# Patient Record
Sex: Female | Born: 1940 | Race: White | Hispanic: No | Smoking: Current every day smoker
Health system: Southern US, Community
[De-identification: ages and names within clinical notes are randomized; demographics above are authoritative.]

## PROBLEM LIST (undated history)

## (undated) DIAGNOSIS — K469 Unspecified abdominal hernia without obstruction or gangrene: Secondary | ICD-10-CM

## (undated) DIAGNOSIS — K219 Gastro-esophageal reflux disease without esophagitis: Secondary | ICD-10-CM

## (undated) DIAGNOSIS — C649 Malignant neoplasm of unspecified kidney, except renal pelvis: Secondary | ICD-10-CM

## (undated) DIAGNOSIS — R350 Frequency of micturition: Secondary | ICD-10-CM

## (undated) DIAGNOSIS — E119 Type 2 diabetes mellitus without complications: Secondary | ICD-10-CM

## (undated) DIAGNOSIS — M549 Dorsalgia, unspecified: Secondary | ICD-10-CM

## (undated) DIAGNOSIS — C3490 Malignant neoplasm of unspecified part of unspecified bronchus or lung: Secondary | ICD-10-CM

## (undated) DIAGNOSIS — E2740 Unspecified adrenocortical insufficiency: Secondary | ICD-10-CM

## (undated) DIAGNOSIS — E039 Hypothyroidism, unspecified: Secondary | ICD-10-CM

## (undated) DIAGNOSIS — F32A Depression, unspecified: Secondary | ICD-10-CM

## (undated) DIAGNOSIS — J449 Chronic obstructive pulmonary disease, unspecified: Secondary | ICD-10-CM

## (undated) DIAGNOSIS — C801 Malignant (primary) neoplasm, unspecified: Secondary | ICD-10-CM

## (undated) DIAGNOSIS — R600 Localized edema: Secondary | ICD-10-CM

## (undated) DIAGNOSIS — R51 Headache: Secondary | ICD-10-CM

## (undated) DIAGNOSIS — R3915 Urgency of urination: Secondary | ICD-10-CM

## (undated) DIAGNOSIS — R351 Nocturia: Secondary | ICD-10-CM

## (undated) DIAGNOSIS — F329 Major depressive disorder, single episode, unspecified: Secondary | ICD-10-CM

## (undated) DIAGNOSIS — C719 Malignant neoplasm of brain, unspecified: Secondary | ICD-10-CM

## (undated) DIAGNOSIS — G479 Sleep disorder, unspecified: Secondary | ICD-10-CM

## (undated) DIAGNOSIS — C419 Malignant neoplasm of bone and articular cartilage, unspecified: Secondary | ICD-10-CM

## (undated) HISTORY — DX: Unspecified adrenocortical insufficiency: E27.40

## (undated) HISTORY — DX: Headache: R51

## (undated) HISTORY — PX: HAND SURGERY: SHX662

## (undated) HISTORY — DX: Hypothyroidism, unspecified: E03.9

## (undated) HISTORY — DX: Dorsalgia, unspecified: M54.9

## (undated) HISTORY — DX: Malignant (primary) neoplasm, unspecified: C80.1

## (undated) HISTORY — DX: Depression, unspecified: F32.A

## (undated) HISTORY — DX: Malignant neoplasm of brain, unspecified: C71.9

## (undated) HISTORY — DX: Localized edema: R60.0

## (undated) HISTORY — DX: Major depressive disorder, single episode, unspecified: F32.9

---

## 1968-10-23 HISTORY — PX: ABDOMINAL HYSTERECTOMY: SHX81

## 1983-10-24 HISTORY — PX: LUNG SURGERY: SHX703

## 1983-10-24 HISTORY — PX: BACK SURGERY: SHX140

## 1986-10-23 HISTORY — PX: BREAST SURGERY: SHX581

## 1998-06-01 ENCOUNTER — Emergency Department (HOSPITAL_COMMUNITY): Admission: EM | Admit: 1998-06-01 | Discharge: 1998-06-01 | Payer: Self-pay | Admitting: Emergency Medicine

## 1999-09-12 ENCOUNTER — Encounter (INDEPENDENT_AMBULATORY_CARE_PROVIDER_SITE_OTHER): Payer: Self-pay

## 1999-09-12 ENCOUNTER — Ambulatory Visit (HOSPITAL_COMMUNITY): Admission: RE | Admit: 1999-09-12 | Discharge: 1999-09-12 | Payer: Self-pay | Admitting: Gastroenterology

## 2000-11-05 ENCOUNTER — Other Ambulatory Visit: Admission: RE | Admit: 2000-11-05 | Discharge: 2000-11-05 | Payer: Self-pay | Admitting: Gynecology

## 2000-11-20 ENCOUNTER — Ambulatory Visit (HOSPITAL_COMMUNITY): Admission: RE | Admit: 2000-11-20 | Discharge: 2000-11-20 | Payer: Self-pay | Admitting: Gynecology

## 2000-11-20 ENCOUNTER — Encounter: Payer: Self-pay | Admitting: Gynecology

## 2001-07-24 ENCOUNTER — Encounter: Payer: Self-pay | Admitting: Gastroenterology

## 2001-07-24 ENCOUNTER — Ambulatory Visit (HOSPITAL_COMMUNITY): Admission: RE | Admit: 2001-07-24 | Discharge: 2001-07-24 | Payer: Self-pay | Admitting: Gastroenterology

## 2001-11-28 ENCOUNTER — Encounter: Payer: Self-pay | Admitting: Internal Medicine

## 2001-11-28 ENCOUNTER — Encounter: Admission: RE | Admit: 2001-11-28 | Discharge: 2001-11-28 | Payer: Self-pay | Admitting: Internal Medicine

## 2002-10-13 ENCOUNTER — Other Ambulatory Visit: Admission: RE | Admit: 2002-10-13 | Discharge: 2002-10-13 | Payer: Self-pay | Admitting: Gynecology

## 2002-11-03 ENCOUNTER — Encounter: Payer: Self-pay | Admitting: Gynecology

## 2002-11-03 ENCOUNTER — Encounter: Admission: RE | Admit: 2002-11-03 | Discharge: 2002-11-03 | Payer: Self-pay | Admitting: Gynecology

## 2003-10-29 ENCOUNTER — Other Ambulatory Visit: Admission: RE | Admit: 2003-10-29 | Discharge: 2003-10-29 | Payer: Self-pay | Admitting: Gynecology

## 2003-11-06 ENCOUNTER — Encounter: Admission: RE | Admit: 2003-11-06 | Discharge: 2003-11-06 | Payer: Self-pay | Admitting: Gynecology

## 2004-11-03 ENCOUNTER — Other Ambulatory Visit: Admission: RE | Admit: 2004-11-03 | Discharge: 2004-11-03 | Payer: Self-pay | Admitting: Gynecology

## 2004-11-23 ENCOUNTER — Ambulatory Visit (HOSPITAL_COMMUNITY): Admission: RE | Admit: 2004-11-23 | Discharge: 2004-11-23 | Payer: Self-pay | Admitting: Gastroenterology

## 2005-11-08 ENCOUNTER — Other Ambulatory Visit: Admission: RE | Admit: 2005-11-08 | Discharge: 2005-11-08 | Payer: Self-pay | Admitting: Gynecology

## 2005-11-27 ENCOUNTER — Ambulatory Visit (HOSPITAL_COMMUNITY): Admission: RE | Admit: 2005-11-27 | Discharge: 2005-11-27 | Payer: Self-pay | Admitting: Gynecology

## 2006-07-26 ENCOUNTER — Ambulatory Visit: Payer: Self-pay | Admitting: Internal Medicine

## 2006-08-20 ENCOUNTER — Ambulatory Visit: Payer: Self-pay | Admitting: Internal Medicine

## 2006-08-23 ENCOUNTER — Encounter: Admission: RE | Admit: 2006-08-23 | Discharge: 2006-08-23 | Payer: Self-pay | Admitting: Orthopedic Surgery

## 2006-11-12 ENCOUNTER — Other Ambulatory Visit: Admission: RE | Admit: 2006-11-12 | Discharge: 2006-11-12 | Payer: Self-pay | Admitting: Gynecology

## 2006-11-22 ENCOUNTER — Ambulatory Visit (HOSPITAL_COMMUNITY): Admission: RE | Admit: 2006-11-22 | Discharge: 2006-11-22 | Payer: Self-pay | Admitting: Gynecology

## 2007-05-03 ENCOUNTER — Ambulatory Visit: Payer: Self-pay | Admitting: Internal Medicine

## 2007-05-04 DIAGNOSIS — Z85118 Personal history of other malignant neoplasm of bronchus and lung: Secondary | ICD-10-CM | POA: Insufficient documentation

## 2007-05-04 DIAGNOSIS — M549 Dorsalgia, unspecified: Secondary | ICD-10-CM | POA: Insufficient documentation

## 2007-10-02 ENCOUNTER — Encounter: Admission: RE | Admit: 2007-10-02 | Discharge: 2007-10-02 | Payer: Self-pay | Admitting: *Deleted

## 2007-10-06 ENCOUNTER — Encounter: Admission: RE | Admit: 2007-10-06 | Discharge: 2007-10-06 | Payer: Self-pay | Admitting: *Deleted

## 2007-11-04 ENCOUNTER — Encounter: Payer: Self-pay | Admitting: Internal Medicine

## 2007-12-02 ENCOUNTER — Ambulatory Visit: Payer: Self-pay | Admitting: Internal Medicine

## 2007-12-02 DIAGNOSIS — R079 Chest pain, unspecified: Secondary | ICD-10-CM | POA: Insufficient documentation

## 2007-12-02 DIAGNOSIS — E785 Hyperlipidemia, unspecified: Secondary | ICD-10-CM

## 2007-12-03 LAB — CONVERTED CEMR LAB
ALT: 16 units/L (ref 0–35)
AST: 15 units/L (ref 0–37)
Albumin: 3.8 g/dL (ref 3.5–5.2)
Alkaline Phosphatase: 83 units/L (ref 39–117)
BUN: 14 mg/dL (ref 6–23)
Basophils Absolute: 0.1 10*3/uL (ref 0.0–0.1)
Basophils Relative: 0.8 % (ref 0.0–1.0)
Bilirubin Urine: NEGATIVE
Bilirubin, Direct: 0.1 mg/dL (ref 0.0–0.3)
CO2: 28 meq/L (ref 19–32)
Calcium: 9 mg/dL (ref 8.4–10.5)
Chloride: 104 meq/L (ref 96–112)
Cholesterol: 190 mg/dL (ref 0–200)
Creatinine, Ser: 0.6 mg/dL (ref 0.4–1.2)
Crystals: NEGATIVE
Direct LDL: 77.7 mg/dL
Eosinophils Absolute: 0.2 10*3/uL (ref 0.0–0.6)
Eosinophils Relative: 1.5 % (ref 0.0–5.0)
GFR calc Af Amer: 128 mL/min
GFR calc non Af Amer: 106 mL/min
Glucose, Bld: 136 mg/dL — ABNORMAL HIGH (ref 70–99)
HCT: 40.3 % (ref 36.0–46.0)
HDL: 30 mg/dL — ABNORMAL LOW (ref >39.0)
Hemoglobin, Urine: NEGATIVE
Hemoglobin: 13.7 g/dL (ref 12.0–15.0)
Hgb A1c MFr Bld: 6.6 % — ABNORMAL HIGH (ref 4.6–6.0)
Ketones, ur: NEGATIVE mg/dL
Lymphocytes Relative: 21.8 % (ref 12.0–46.0)
MCHC: 33.9 g/dL (ref 30.0–36.0)
MCV: 86.8 fL (ref 78.0–100.0)
Monocytes Absolute: 0.8 10*3/uL — ABNORMAL HIGH (ref 0.2–0.7)
Monocytes Relative: 6.6 % (ref 3.0–11.0)
Neutro Abs: 8.9 10*3/uL — ABNORMAL HIGH (ref 1.4–7.7)
Neutrophils Relative %: 69.3 % (ref 43.0–77.0)
Nitrite: NEGATIVE
Platelets: 258 10*3/uL (ref 150–400)
Potassium: 4.5 meq/L (ref 3.5–5.1)
RBC / HPF: NONE SEEN
RBC: 4.65 M/uL (ref 3.87–5.11)
RDW: 13.5 % (ref 11.5–14.6)
Sodium: 139 meq/L (ref 135–145)
Specific Gravity, Urine: 1.025 (ref 1.000–1.03)
TSH: 1.49 microintl units/mL (ref 0.35–5.50)
Total Bilirubin: 0.8 mg/dL (ref 0.3–1.2)
Total CHOL/HDL Ratio: 6.3
Total Protein, Urine: NEGATIVE mg/dL
Total Protein: 7 g/dL (ref 6.0–8.3)
Triglycerides: 279 mg/dL (ref 0–149)
Urine Glucose: NEGATIVE mg/dL
Urobilinogen, UA: 0.2 (ref 0.0–1.0)
VLDL: 56 mg/dL — ABNORMAL HIGH (ref 0–40)
WBC: 12.8 10*3/uL — ABNORMAL HIGH (ref 4.5–10.5)
pH: 5.5 (ref 5.0–8.0)

## 2008-02-19 ENCOUNTER — Ambulatory Visit: Payer: Self-pay | Admitting: Internal Medicine

## 2008-02-19 DIAGNOSIS — E119 Type 2 diabetes mellitus without complications: Secondary | ICD-10-CM

## 2008-02-19 DIAGNOSIS — K219 Gastro-esophageal reflux disease without esophagitis: Secondary | ICD-10-CM

## 2008-04-17 ENCOUNTER — Encounter: Payer: Self-pay | Admitting: Internal Medicine

## 2008-05-13 ENCOUNTER — Ambulatory Visit: Payer: Self-pay | Admitting: Internal Medicine

## 2008-05-13 DIAGNOSIS — F4321 Adjustment disorder with depressed mood: Secondary | ICD-10-CM | POA: Insufficient documentation

## 2008-06-17 ENCOUNTER — Encounter: Payer: Self-pay | Admitting: Internal Medicine

## 2008-07-03 ENCOUNTER — Ambulatory Visit (HOSPITAL_COMMUNITY): Admission: RE | Admit: 2008-07-03 | Discharge: 2008-07-03 | Payer: Self-pay | Admitting: Gastroenterology

## 2008-07-13 ENCOUNTER — Ambulatory Visit: Payer: Self-pay | Admitting: Internal Medicine

## 2008-07-13 DIAGNOSIS — J209 Acute bronchitis, unspecified: Secondary | ICD-10-CM | POA: Insufficient documentation

## 2008-08-06 ENCOUNTER — Ambulatory Visit: Payer: Self-pay | Admitting: Internal Medicine

## 2008-08-06 DIAGNOSIS — R252 Cramp and spasm: Secondary | ICD-10-CM | POA: Insufficient documentation

## 2008-08-06 DIAGNOSIS — F411 Generalized anxiety disorder: Secondary | ICD-10-CM | POA: Insufficient documentation

## 2008-08-06 DIAGNOSIS — R634 Abnormal weight loss: Secondary | ICD-10-CM | POA: Insufficient documentation

## 2008-08-06 DIAGNOSIS — F3289 Other specified depressive episodes: Secondary | ICD-10-CM | POA: Insufficient documentation

## 2008-08-06 DIAGNOSIS — F329 Major depressive disorder, single episode, unspecified: Secondary | ICD-10-CM | POA: Insufficient documentation

## 2008-12-22 ENCOUNTER — Ambulatory Visit: Payer: Self-pay | Admitting: Internal Medicine

## 2008-12-22 DIAGNOSIS — J441 Chronic obstructive pulmonary disease with (acute) exacerbation: Secondary | ICD-10-CM

## 2008-12-22 LAB — CONVERTED CEMR LAB
ALT: 13 units/L (ref 0–35)
AST: 15 units/L (ref 0–37)
Albumin: 3.7 g/dL (ref 3.5–5.2)
Alkaline Phosphatase: 73 units/L (ref 39–117)
BUN: 15 mg/dL (ref 6–23)
Basophils Absolute: 0.1 10*3/uL (ref 0.0–0.1)
Basophils Relative: 0.9 % (ref 0.0–3.0)
Bilirubin Urine: NEGATIVE
Bilirubin, Direct: 0.1 mg/dL (ref 0.0–0.3)
CO2: 28 meq/L (ref 19–32)
Calcium: 8.7 mg/dL (ref 8.4–10.5)
Chloride: 104 meq/L (ref 96–112)
Cholesterol: 204 mg/dL (ref 0–200)
Creatinine, Ser: 0.7 mg/dL (ref 0.4–1.2)
Crystals: NEGATIVE
Direct LDL: 98.3 mg/dL
Eosinophils Absolute: 0.2 10*3/uL (ref 0.0–0.7)
Eosinophils Relative: 1.7 % (ref 0.0–5.0)
GFR calc Af Amer: 107 mL/min
GFR calc non Af Amer: 88 mL/min
Glucose, Bld: 98 mg/dL (ref 70–99)
HCT: 40.1 % (ref 36.0–46.0)
HDL: 26 mg/dL — ABNORMAL LOW (ref >39.0)
Hemoglobin, Urine: NEGATIVE
Hemoglobin: 13.8 g/dL (ref 12.0–15.0)
Ketones, ur: NEGATIVE mg/dL
Lymphocytes Relative: 26.2 % (ref 12.0–46.0)
MCHC: 34.4 g/dL (ref 30.0–36.0)
MCV: 85.6 fL (ref 78.0–100.0)
Monocytes Absolute: 0.8 10*3/uL (ref 0.1–1.0)
Monocytes Relative: 5.8 % (ref 3.0–12.0)
Mucus, UA: NEGATIVE
Neutro Abs: 8.7 10*3/uL — ABNORMAL HIGH (ref 1.4–7.7)
Neutrophils Relative %: 65.4 % (ref 43.0–77.0)
Nitrite: NEGATIVE
Platelets: 241 10*3/uL (ref 150–400)
Potassium: 3.9 meq/L (ref 3.5–5.1)
RBC: 4.68 M/uL (ref 3.87–5.11)
RDW: 12.8 % (ref 11.5–14.6)
Sodium: 140 meq/L (ref 135–145)
Specific Gravity, Urine: 1.015 (ref 1.000–1.035)
TSH: 1.49 microintl units/mL (ref 0.35–5.50)
Total Bilirubin: 0.7 mg/dL (ref 0.3–1.2)
Total CHOL/HDL Ratio: 7.8
Total Protein, Urine: NEGATIVE mg/dL
Total Protein: 7 g/dL (ref 6.0–8.3)
Triglycerides: 374 mg/dL (ref 0–149)
Urine Glucose: NEGATIVE mg/dL
Urobilinogen, UA: 0.2 (ref 0.0–1.0)
VLDL: 75 mg/dL — ABNORMAL HIGH (ref 0–40)
WBC: 13.3 10*3/uL — ABNORMAL HIGH (ref 4.5–10.5)
pH: 6.5 (ref 5.0–8.0)

## 2008-12-25 ENCOUNTER — Telehealth (INDEPENDENT_AMBULATORY_CARE_PROVIDER_SITE_OTHER): Payer: Self-pay | Admitting: *Deleted

## 2009-12-09 ENCOUNTER — Encounter: Payer: Self-pay | Admitting: Internal Medicine

## 2009-12-21 ENCOUNTER — Ambulatory Visit: Payer: Self-pay | Admitting: Internal Medicine

## 2010-01-07 ENCOUNTER — Telehealth (INDEPENDENT_AMBULATORY_CARE_PROVIDER_SITE_OTHER): Payer: Self-pay | Admitting: *Deleted

## 2010-03-29 ENCOUNTER — Encounter: Payer: Self-pay | Admitting: Internal Medicine

## 2010-04-20 ENCOUNTER — Ambulatory Visit: Payer: Self-pay | Admitting: Internal Medicine

## 2010-04-20 DIAGNOSIS — Z860100 Personal history of colon polyps, unspecified: Secondary | ICD-10-CM | POA: Insufficient documentation

## 2010-04-20 DIAGNOSIS — K5730 Diverticulosis of large intestine without perforation or abscess without bleeding: Secondary | ICD-10-CM | POA: Insufficient documentation

## 2010-04-20 DIAGNOSIS — K4090 Unilateral inguinal hernia, without obstruction or gangrene, not specified as recurrent: Secondary | ICD-10-CM | POA: Insufficient documentation

## 2010-04-20 LAB — CONVERTED CEMR LAB: TSH: 1.67 microintl units/mL (ref 0.35–5.50)

## 2010-05-03 ENCOUNTER — Encounter: Payer: Self-pay | Admitting: Internal Medicine

## 2010-06-29 ENCOUNTER — Ambulatory Visit: Payer: Self-pay | Admitting: Internal Medicine

## 2010-10-30 ENCOUNTER — Emergency Department (HOSPITAL_COMMUNITY)
Admission: EM | Admit: 2010-10-30 | Discharge: 2010-10-31 | Payer: Self-pay | Source: Home / Self Care | Admitting: Emergency Medicine

## 2010-11-07 LAB — COMPREHENSIVE METABOLIC PANEL
ALT: 10 U/L (ref 0–35)
AST: 14 U/L (ref 0–37)
Albumin: 3.4 g/dL — ABNORMAL LOW (ref 3.5–5.2)
Alkaline Phosphatase: 73 U/L (ref 39–117)
BUN: 13 mg/dL (ref 6–23)
CO2: 25 mEq/L (ref 19–32)
Calcium: 9.1 mg/dL (ref 8.4–10.5)
Chloride: 105 mEq/L (ref 96–112)
Creatinine, Ser: 0.8 mg/dL (ref 0.4–1.2)
GFR calc Af Amer: >60 mL/min (ref >60)
GFR calc non Af Amer: >60 mL/min (ref >60)
Glucose, Bld: 104 mg/dL — ABNORMAL HIGH (ref 70–99)
Potassium: 3.8 mEq/L (ref 3.5–5.1)
Sodium: 138 mEq/L (ref 135–145)
Total Bilirubin: 0.5 mg/dL (ref 0.3–1.2)
Total Protein: 6.5 g/dL (ref 6.0–8.3)

## 2010-11-07 LAB — URINE MICROSCOPIC-ADD ON

## 2010-11-07 LAB — URINALYSIS, ROUTINE W REFLEX MICROSCOPIC
Bilirubin Urine: NEGATIVE
Hgb urine dipstick: NEGATIVE
Ketones, ur: NEGATIVE mg/dL
Nitrite: NEGATIVE
Protein, ur: NEGATIVE mg/dL
Specific Gravity, Urine: 1.011 (ref 1.005–1.030)
Urine Glucose, Fasting: NEGATIVE mg/dL
Urobilinogen, UA: 0.2 mg/dL (ref 0.0–1.0)
pH: 6.5 (ref 5.0–8.0)

## 2010-11-07 LAB — LIPASE, BLOOD: Lipase: 18 U/L (ref 11–59)

## 2010-11-17 ENCOUNTER — Telehealth (INDEPENDENT_AMBULATORY_CARE_PROVIDER_SITE_OTHER): Payer: Self-pay | Admitting: *Deleted

## 2010-11-24 NOTE — Consult Note (Signed)
Summary: [facility]   Imported By: Sherian Rein 05/16/2010 11:36:43  _____________________________________________________________________  External Attachment:    Type:   Image     Comment:   External Document

## 2010-11-24 NOTE — Assessment & Plan Note (Signed)
Summary: fu---stc   Vital Signs:  Patient profile:   70 year old female Height:      67 inches Weight:      136 pounds BMI:     21.38 O2 Sat:      96 % on Room air Temp:     97.5 degrees F oral Pulse rate:   73 / minute BP sitting:   102 / 62  (left arm) Cuff size:   regular  Vitals Entered ByZella Ball Ewing (December 21, 2009 8:07 AM)  O2 Flow:  Room air  Preventive Care Screening     declines flu shot and pneumovax;  sees GYn yearly with pap , delcines mammogram and dxa  CC: followup/RE   CC:  followup/RE.  History of Present Illness: here for wellness, recent labs per GYN reviewed with mild elevtated a1c only;  Pt denies CP, sob, doe, wheezing, orthopnea, pnd, worsening LE edema, palps, dizziness or syncope   Pt denies new neuro symptoms such as headache, facial or extremity weakness   Pt denies polydipsia, polyuria, or low sugar symptoms such as shakiness improved with eating.  Overall good compliance with meds, trying to follow low chol, DM diet, wt stable, little excercise however   Preventive Screening-Counseling & Management      Drug Use:  no.    Problems Prior to Update: 1)  Chronic Obstructive Pulmonary Disease, Acute Exacerbation  (ICD-491.21) 2)  Preventive Health Care  (ICD-V70.0) 3)  Anxiety  (ICD-300.00) 4)  Depression  (ICD-311) 5)  Leg Cramps, Nocturnal  (ICD-729.82) 6)  Depressive Disorder  (ICD-311) 7)  Chest Pain Unspecified  (ICD-786.50) 8)  Weight Loss  (ICD-783.21) 9)  Asthmatic Bronchitis, Acute  (ICD-466.0) 10)  Grief Reaction, Acute  (ICD-309.0) 11)  Gerd  (ICD-530.81) 12)  Diabetes Mellitus, Type II  (ICD-250.00) 13)  Preventive Health Care  (ICD-V70.0) 14)  Chest Pain  (ICD-786.50) 15)  Hyperlipidemia  (ICD-272.4) 16)  Lung Cancer, Hx of  (ICD-V10.11) 17)  Backache Nos  (ICD-724.5) 18)  Elective Surgery, Elective Surgery Nec  (ICD-V50.8)  Medications Prior to Update: 1)  Parafon Forte Dsc 500 Mg  Tabs (Chlorzoxazone) .Marland Kitchen.. 1 By Mouth  Three Times A Day Prn 2)  Sertraline Hcl 50 Mg Tabs (Sertraline Hcl) .Marland Kitchen.. 1 By Mouth Once Daily 3)  Cephalexin 500 Mg Caps (Cephalexin) .Marland Kitchen.. 1 By Mouth Three Times A Day 4)  Prednisone 10 Mg Tabs (Prednisone) .... 4po Qd For 3days, Then 3po Qd For 3days, Then 2po Qd For 3days, Then 1po Qd For 3 Days, Then Stop 5)  Tessalon Perles 100 Mg Caps (Benzonatate) .Marland Kitchen.. 1 - 2 By Mouth Three Times A Day As Needed Cough  Current Medications (verified): 1)  Parafon Forte Dsc 500 Mg  Tabs (Chlorzoxazone) .Marland Kitchen.. 1 By Mouth Three Times A Day Prn 2)  Sertraline Hcl 50 Mg Tabs (Sertraline Hcl) .Marland Kitchen.. 1 By Mouth Once Daily 3)  Cephalexin 500 Mg Caps (Cephalexin) .Marland Kitchen.. 1 By Mouth Three Times A Day 4)  Prednisone 10 Mg Tabs (Prednisone) .... 4po Qd For 3days, Then 3po Qd For 3days, Then 2po Qd For 3days, Then 1po Qd For 3 Days, Then Stop 5)  Tessalon Perles 100 Mg Caps (Benzonatate) .Marland Kitchen.. 1 - 2 By Mouth Three Times A Day As Needed Cough  Allergies (verified): 1)  ! Codeine 2)  ! 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 Hysterectomy-1972 Lung-lobectomy- Right [phone] Breast Augmentation- 1989 Back- Fell & damaged L4/L5 s/p left frozen shoulder swurgury  Social History: Reviewed history from 12/02/2007 and no changes required. 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         all otherwise negative per pt -    Physical Exam  General:  alert and well-developed.   Head:  normocephalic and atraumatic.   Eyes:  vision grossly intact, pupils equal, and pupils round.   Ears:  R ear normal and L ear normal.   Nose:  no external deformity and no nasal discharge.   Mouth:  no gingival abnormalities and pharynx pink and moist.   Neck:  supple and no masses.   Lungs:  normal respiratory effort and normal breath sounds.   Heart:  normal rate and regular rhythm.   Abdomen:  soft, non-tender, and normal bowel sounds.   Msk:  no joint tenderness and no joint swelling.   Extremities:  no edema, no erythema  Neurologic:  cranial nerves II-XII intact and strength normal in all extremities.   Psych:  not anxious appearing and not depressed appearing.     Impression & Recommendations:  Problem # 1:  Preventive Health Care (ICD-V70.0) Overall doing well, age appropriate education and counseling updated and referral for appropriate preventive services done unless declined, immunizations up to date or declined, diet counseling done if overweight, urged to quit smoking if smokes , most recent labs reviewed and current ordered if appropriate, ecg reviewed or declined (interpretation per ECG scanned in the EMR if done); information regarding Medicare Prevention requirements given if appropriate   Problem # 2:  DIABETES MELLITUS, TYPE II (ICD-250.00) recent blood sugar elevated  Complete Medication  List: 1)  Parafon Forte Dsc 500 Mg Tabs (Chlorzoxazone) .Marland Kitchen.. 1 by mouth three times a day prn 2)  Sertraline Hcl 50 Mg Tabs (Sertraline hcl) .Marland Kitchen.. 1 by mouth once daily 3)  Cephalexin 500 Mg Caps (Cephalexin) .Marland Kitchen.. 1 by mouth three times a day 4)  Prednisone 10 Mg Tabs (Prednisone) .... 4po qd for 3days, then 3po qd for 3days, then 2po qd for 3days, then 1po qd for 3 days, then stop 5)  Tessalon Perles 100 Mg Caps (Benzonatate) .Marland Kitchen.. 1 - 2 by mouth three times a day as needed cough  Patient Instructions: 1)  Continue all previous medications as before this visit  2)  Please schedule a follow-up appointment in 6 months wth : 3)  BMP prior to visit, ICD-9: 250.02 4)  Lipid Panel prior to visit, ICD-9: 5)  HbgA1C prior to visit, ICD-9:

## 2010-11-24 NOTE — Progress Notes (Signed)
Summary: back specialist  Phone Note Call from Patient Call back at [phone]   Summary of Call: Patient called requesting the name of a back specialist. Please advise. Initial call taken by: Lucious Groves,  January 07, 2010 10:53 AM  Follow-up for Phone Call        Dr Dutch Quint is very good neurosurgeon Follow-up by: Corwin Levins MD,  January 07, 2010 11:01 AM  Additional Follow-up for Phone Call Additional follow up Details #1::        Informed pt Additional Follow-up by: Josph Macho RMA,  January 07, 2010 1:41 PM

## 2010-11-24 NOTE — Assessment & Plan Note (Signed)
Summary: depression/offered sda pt req this wk/#/cd   Vital Signs:  Patient profile:   70 year old female Height:      67 inches Weight:      130 pounds BMI:     20.43 O2 Sat:      97 % on Room air Temp:     98.3 degrees F oral Pulse rate:   70 / minute BP sitting:   100 / 60  (left arm) Cuff size:   regular  Vitals Entered By: Zella Ball Ewing CMA Duncan Dull) (April 20, 2010 8:10 AM)  O2 Flow:  Room air  Preventive Care Screening  Colonoscopy:    Date:  03/29/2010    Next Due:  03/2015    Results:  abnormal      declines mammogram and pneumovax  CC: Depression and weight loss/RE   CC:  Depression and weight loss/RE.  History of Present Illness: here with increased depressive symptoms with fatigue, low mood, loss of appetite tearfulness that is out of proportion to what she has been able to get past herself in the past;  late may 2011 was in bed for about 2 wks;  she made it to a local urgent care and tx for bronchitis and copd exacerbation (antibx, prednisone, cough med);  but depression and fatigue persists;  no suicidal ideation or significant increesed anxiety or panic.  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  Still smoking and not thinkging of trying to quit at this time.  Has had low times in the past but no med trials.. Goes to husbnad's grave once montnly.  Has people at church for support but felt too bad to do that.  peak average wt semed to be about 155 a few yrs ago. Since husbnad died now down to 130, 6 lbs of that in the past month.  Has a knot to the left inguinal area that seems to come and go without pain, n/v.  Just had colonoscopy.    chronic back pain stable but persits and her insurance is not accepted by her previous orthopedic.  Has chronic persistent tailbone pain with the wt loss. , sits with  a donut mostly.    Problems Prior to Update: 1)  Chronic Obstructive Pulmonary Disease, Acute Exacerbation  (ICD-491.21) 2)   Preventive Health Care  (ICD-V70.0) 3)  Anxiety  (ICD-300.00) 4)  Depression  (ICD-311) 5)  Leg Cramps, Nocturnal  (ICD-729.82) 6)  Depressive Disorder  (ICD-311) 7)  Chest Pain Unspecified  (ICD-786.50) 8)  Weight Loss  (ICD-783.21) 9)  Asthmatic Bronchitis, Acute  (ICD-466.0) 10)  Grief Reaction, Acute  (ICD-309.0) 11)  Gerd  (ICD-530.81) 12)  Diabetes Mellitus, Type II  (ICD-250.00) 13)  Preventive Health Care  (ICD-V70.0) 14)  Chest Pain  (ICD-786.50) 15)  Hyperlipidemia  (ICD-272.4) 16)  Lung Cancer, Hx of  (ICD-V10.11) 17)  Backache Nos  (ICD-724.5) 18)  Elective Surgery, Elective Surgery Nec  (ICD-V50.8)  Medications Prior to Update: 1)  Parafon Forte Dsc 500 Mg  Tabs (Chlorzoxazone) .Marland Kitchen.. 1 By Mouth Three Times A Day Prn 2)  Sertraline Hcl 50 Mg Tabs (Sertraline Hcl) .Marland Kitchen.. 1 By Mouth Once Daily 3)  Cephalexin 500 Mg Caps (Cephalexin) .Marland Kitchen.. 1 By Mouth Three Times A Day 4)  Prednisone 10 Mg Tabs (Prednisone) .... 4po Qd For 3days, Then 3po Qd For 3days, Then 2po Qd For 3days, Then 1po Qd For 3 Days, Then Stop 5)  Tessalon Perles  100 Mg Caps (Benzonatate) .Marland Kitchen.. 1 - 2 By Mouth Three Times A Day As Needed Cough  Current Medications (verified): 1)  Parafon Forte Dsc 500 Mg  Tabs (Chlorzoxazone) .Marland Kitchen.. 1 By Mouth Three Times A Day Prn 2)  Sertraline Hcl 50 Mg Tabs (Sertraline Hcl) .Marland Kitchen.. 1 By Mouth Once Daily 3)  Cephalexin 500 Mg Caps (Cephalexin) .Marland Kitchen.. 1 By Mouth Three Times A Day 4)  Prednisone 10 Mg Tabs (Prednisone) .... 4po Qd For 3days, Then 3po Qd For 3days, Then 2po Qd For 3days, Then 1po Qd For 3 Days, Then Stop 5)  Tessalon Perles 100 Mg Caps (Benzonatate) .Marland Kitchen.. 1 - 2 By Mouth Three Times A Day As Needed Cough  Allergies (verified): 1)  ! Codeine 2)  ! 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 Hysterectomy-1972 Lung-lobectomy- Right [phone] Breast Augmentation- 1989 Back- Fell & damaged L4/L5 s/p left frozen shoulder swurgury  Review of Systems  The patient denies fever, weight gain, vision loss, decreased hearing, hoarseness, chest pain, syncope, dyspnea on exertion, peripheral edema, prolonged cough, headaches, hemoptysis, abdominal pain, melena, hematochezia, severe indigestion/heartburn, hematuria, muscle weakness, suspicious skin lesions, transient blindness, difficulty walking, unusual weight change, abnormal bleeding, enlarged lymph nodes, and angioedema.         all otherwise negative per pt -    Physical Exam  General:  alert and underweight appearing.   Head:  normocephalic and atraumatic.   Eyes:  vision grossly intact, pupils equal, and pupils round.   Ears:  R ear normal and L ear normal.   Nose:  no external deformity and no nasal discharge.   Mouth:  no gingival abnormalities and pharynx pink and moist.   Neck:  supple and no masses.   Lungs:  normal respiratory effort, R decreased breath sounds, and L decreased breath sounds.   Heart:  normal rate and regular rhythm.   Abdomen:  soft, non-tender, and normal bowel sounds.  , nontender reducible left ing hernia noted Msk:  no joint tenderness and no joint swelling.   Extremities:  no edema, no erythema  Neurologic:  cranial nerves II-XII intact and strength normal in all extremities.     Impression & Recommendations:  Problem # 1:  Preventive Health  Care (ICD-V70.0)  Overall doing well, age appropriate education and counseling updated and referral for appropriate preventive services done unless declined, immunizations up to date or declined, diet counseling done if overweight, urged to quit smoking if smokes , most recent labs reviewed and current ordered if appropriate, ecg reviewed or declined (interpretation per ECG scanned in the EMR if done); information regarding Medicare Prevention requirements given if appropriate; speciality referrals updated as appropriate ; for TSH, other labs from GYN feb 2011 reviewed  Orders: TLB-TSH (Thyroid Stimulating Hormone) (84443-TSH)  Problem # 2:  DEPRESSION (ICD-311)  Her updated medication list for this problem includes:    Paroxetine Hcl 20 Mg Tabs (Paroxetine hcl) .Marland Kitchen... 1 by mouth once daily treat  as above, f/u any worsening signs or symptoms   Problem # 3:  INGUINAL HERNIA, LEFT (ICD-550.90)  for gen surgury opinion  Orders: Surgical Referral (Surgery)  Complete Medication List: 1)  Parafon Forte Dsc 500 Mg Tabs (Chlorzoxazone) .Marland Kitchen.. 1 by mouth three times a day prn 2)  Paroxetine Hcl 20 Mg Tabs (Paroxetine hcl) .Marland Kitchen.. 1 by mouth once daily  Patient Instructions: 1)  Please take all new medications as prescribed  - the paxil is taken at HALF pill for 1 wk, then whole pill after that 2)  Continue all previous medications as before this visit 3)  Please go to the Lab in the basement for your blood and/or urine tests today  4)  You will be contacted about the referral(s) to: General surgury 5)  Please schedule a follow-up appointment in 2 months or sooner if needed Prescriptions: PAROXETINE HCL 20 MG TABS (PAROXETINE HCL) 1 by mouth once daily  #90 x 3   Entered and Authorized by:   Corwin Levins MD   Signed by:   Corwin Levins MD on 04/20/2010   Method used:   Print then Give to Patient   RxID:   [id number]

## 2010-11-24 NOTE — Progress Notes (Signed)
  Phone Note Other Incoming   Request: Send information Summary of Call: Request for records received from [facility]. Request forwarded to [employer].  Last 5 yrs med hx.

## 2010-11-24 NOTE — Procedures (Signed)
Summary: Colonoscopy / [facility]  Colonoscopy / [facility]   Imported By: Lennie Odor 04/08/2010 16:44:33  _____________________________________________________________________  External Attachment:    Type:   Image     Comment:   External Document

## 2010-11-25 NOTE — Letter (Signed)
Summary: Beather Arbour MD  Beather Arbour MD   Imported By: Sherian Rein 12/16/2009 11:03:09  _____________________________________________________________________  External Attachment:    Type:   Image     Comment:   External Document

## 2011-03-07 NOTE — Op Note (Signed)
[name], Kaylee [name]         ACCOUNT NO.:  [id number]   MEDICAL RECORD NO.:  [id number]          PATIENT TYPE:  AMB   LOCATION:  ENDO                         FACILITY:  [facility]   PHYSICIAN:  Bernette Redbird, M.D.   DATE OF BIRTH:  [birth year]   DATE OF PROCEDURE:  07/03/2008  DATE OF DISCHARGE:                               OPERATIVE REPORT   PROCEDURE:  Upper endoscopy Savary dilatation of the esophagus under  fluoroscopy.   INDICATIONS:  A 70 year old female with dysphagia symptoms in the  setting of longstanding GERD.   FINDINGS:  Essentially normal exam.   PROCEDURE:  The nature, purpose, and risks of the procedure had been  discussed with the patient who provided written consent.  Sedation was  fentanyl 75 mcg and Versed 10 mg IV prior to and during the course the  procedure without clinical instability.  The Pentax video endoscope was  passed under direct vision, entering the esophagus without significant  difficulty.   The esophagus was endoscopically normal despite a history of GERD.  I  did not see any Barrett esophagus, reflux esophagitis, free reflux nor  any other problems such as varices, infection, or neoplasia.  A very  small hiatal hernia was present, but I could not visualize any  esophageal ring, stricture, or other source of dysphagia and there was  no evidence of any obvious esophageal spasm or corkscrewing.   The stomach contained a small to moderate bilious residual that was  suctioned up.  There was a little bit of patchy antral erythema  consistent with the patient's use of Excedrin.  There was even a small  erosion in the antrum, but no frank ulcers and no polyps or masses.  The  retroflexed view the cardia was unremarkable.   The pylorus, duodenal bulb, and second duodenum looked normal.   Savary dilatation was then performed in the standard fashion.  The  spring tip guidewire was passed through the scope into the antrum of the  stomach, and the  scope was removed in an exchange fashion, leaving the  guidewire in place.  Its position was confirmed fluoroscopically and  passage was then made using a 16-mm Savary dilator with fluoroscopic  guidance, confirming passage of the widest portion of the dilator below  the level of the diaphragm.  I re-endoscoped the patient to make sure  there was no significant mucosal disruption, although there has just  been smooth resistance with passage of the dilator.  Indeed, repeat  endoscopy showed no evidence of any mucosal disruption, so I went ahead  and reinserted the guidewire and we performed Savary dilatation by the  same technique as described above, using an 18-mm dilator.  I then re-  endoscoped the patient again.  This, like before, demonstrated no  evidence of any mucosal disruption nor of any significant trauma to the  stomach, esophagus, or hypopharynx.   The scope was removed from the patient who tolerated the procedure well  and without any apparent complication.   IMPRESSION:  1. Dysphagia symptoms, without endoscopically-evident cause.  2. Empiric esophageal dilatation to 18 mm performed as described  above.   PLAN:  Clinical followup of dysphagia symptoms.           ______________________________  Bernette Redbird, M.D.     RB/MEDQ  D:  07/03/2008  T:  07/04/2008  Job:  [id number]   cc:   Corwin Levins, MD

## 2011-03-07 NOTE — Assessment & Plan Note (Signed)
[facility]                                 ON-CALL NOTE   Kaylee, [name]                  MRN:          [id number]  DATE:09/28/2007                            DOB:          1940/11/05    Date of service:  September 28, 2007.  Time received:  7:52 a.m.  Caller:  [name].   PRIMARY CARE PHYSICIAN:  Corwin Levins, M.D.   Date of birth:  06-May-1941.  Telephone:  [phone].   Yesterday evening while trying to out of her car she tripped and fell,  landing on the pavement.  She said she hit directly on her left breast.  All night long and again this morning, she has severe, sharp pains in  the breast and in the ribs.  It hurts to breathe.  She is worried she  may have cracked a rib.  My advice is to go to [facility]  later this morning to be seen.     Tera Mater. Clent Ridges, MD  Electronically Signed    SAF/MedQ  DD: 09/28/2007  DT: 09/29/2007  Job #: [id number]

## 2011-03-10 NOTE — Op Note (Signed)
Kaylee [name], Kaylee [name]         ACCOUNT NO.:  [id number]   MEDICAL RECORD NO.:  [id number]          PATIENT TYPE:  AMB   LOCATION:  ENDO                         FACILITY:  [facility]   PHYSICIAN:  Bernette Redbird, M.D.   DATE OF BIRTH:  04-Jul-1941   DATE OF PROCEDURE:  11/23/2004  DATE OF DISCHARGE:                                 OPERATIVE REPORT   PROCEDURE:  Upper endoscopy.   INDICATIONS:  Nonspecific chest pain in a 70 year old female, with response  to PPI therapy suggesting probable reflux etiology.   FINDINGS:  Normal exam.   PROCEDURE:  That nature, purpose, and risks of the procedure were reviewed  with the patient prior to the procedure and she provided written consent.   SEDATION:  Fentanyl 75 mcg and Versed 7.5 milligrams IV without arrhythmias  or desaturation.   DESCRIPTION OF PROCEDURE:  The Olympus video endoscope was passed under  direct vision. The vocal cords were not well seen but the esophagus was  readily entered and appeared normal. No reflux esophagitis, Barrett's  esophagus, varices, infection or neoplasia were observed in the esophagus  and no ring, stricture or significant hiatal hernia was appreciated.   The stomach contained no residual and had normal mucosa without evidence of  gastritis, erosions, ulcers, polyps or masses and a retroflexed view of  cardia was unremarkable. Pylorus, duodenal bulb and second duodenum  similarly looked normal.   The scope was removed from the patient. No biopsies were obtained. She  tolerated the procedure well and there were no apparent complications.   IMPRESSION:  Chest pain, probably due to reflux, responsive to PPI therapy,  without adverse sequelae endoscopically evident (530.81).   PLAN:  Continue PPI therapy (Protonix) as needed to control symptoms.      RB/MEDQ  D:  11/23/2004  T:  11/23/2004  Job:  [id number]   cc:   Ike Bene, M.D.  [address]  [place]  [place] [address]  Fax:  [phone]

## 2011-03-10 NOTE — Procedures (Signed)
[place]  Patient:    Kaylee [name], Kaylee [name] Visit Number: [id number] MRN: [id number]          Service Type: END Location: ENDO Attending Physician:  Rich Brave Proc. Date: 07/24/01 Admit Date:  07/24/2001   CC:         Barbette Hair. Vaughan Basta., M.D.   Procedure Report  NAME OF PROCEDURES: 1. Upper endoscopy. 2. Savary dilatation of esophagus.  ENDOSCOPIST:  Florencia Reasons, M.D.  INDICATIONS:  [age]-year-old female with reflux symptoms and persistent dysphagia, which has not responded to PPI therapy.  FINDINGS:  No source of dysphagia identified.  Empiric dilatation to 18 mm.  PROCEDURE:  The patient provided written consent for the procedure.  Sedation was fentanyl 50 mcg and Versed 7.5 mg IV without arrhythmias or desaturation. The Olympus small caliber adult video endoscope was passed under direct vision.  The vocal cords were not well seen.  The esophagus was readily entered and had normal mucosa throughout its entire length.  The squamocolumnar junction was quite prominent and I believe that there was no Barretts esophagus, although a short segment of circumferential Barretts esophagus could conceivably have been present.  I did not see any ring or stricture.  There was no evidence of neoplasia, varices or infection.  a roughly 2 cm hiatal hernia was seen.  The stomach contained no significant residual and had normal mucosa without evidence of gastritis, erosions, ulcers, polyps, or masses; and, the retroflexed view of the proximal stomach was unremarkable apart from the small hiatal hernia.  The pylorus, duodenal bulb and proximal second duodenum looked normal.  Savary dilatation was then performed in a standard fashion.  The spring-tipped guide wire was advanced into the antrum of the stomach, and the scope was removed in an exchange fashion after which a single passage was made with an 18 mm Savary dilator,  using fluoroscopy to confirm appropriate positioning of the guide wire and passage of widest portion of the dilator below the level of the diaphragm.  The dilator was left in place a short while and then removed along with guide wire.  The patient was re-endoscoped under direct vision and this disclosed no evidence of trauma to the hypopharynx, esophagus or stomach; and, no obvious disruption of any ring.  The patient tolerated the procedure well and there were no apparent complications.  IMPRESSIONS: 1. Small hiatal hernia. 2. No evident source of dysphagia symptoms or evidence adverse sequela    of reflux disease noted.  PLAN:  Clinical follow up of dysphagia symptoms. Attending Physician:  Rich Brave DD:  07/24/01 TD:  07/24/01 Job: [id number]

## 2011-03-10 NOTE — Op Note (Signed)
NAME:  Kaylee [name], Kaylee [name]         ACCOUNT NO.:  [id number]   MEDICAL RECORD NO.:  [id number]          PATIENT TYPE:  AMB   LOCATION:  ENDO                         FACILITY:  [facility]   PHYSICIAN:  Bernette Redbird, M.D.   DATE OF BIRTH:  [birth year]   DATE OF PROCEDURE:  DATE OF DISCHARGE:                                 OPERATIVE REPORT   PROCEDURE:  Colonoscopy   INDICATIONS:  Family history of colon cancer in a 70 year old female.   FINDINGS:  Sigmoid diverticulosis and scattered diverticulosis elsewhere in  the colon.   PROCEDURE:  The nature, purpose and risks of the procedure were familiar to  the patient from prior examination and she provided written consent.  Sedation for this procedure in the upper endoscopy which preceded it was  fentanyl 110 mcg and Versed 11 mg IV without arrhythmias or desaturation.  The Olympus adjustable tension pediatric video colonoscope was advanced  through somewhat angulated fixated sigmoid region, made easier by turning  the patient into the supine position. We then advanced with the external  abdominal pressure to help control looping and the terminal ileum was  entered for short distance and appeared normal, after which pullback was  performed. The quality of prep was very good and it was felt that all areas  were well seen.   There was some scattered right-sided diverticulosis and some moderately  severe sigmoid diverticulosis. However, no polyps, cancer, colitis or  vascular malformations were observed. Retroflexion in the rectum showed some  small hypertrophied anal papillae. Reinspection of the rectum was  unremarkable. No biopsies were obtained. The patient tolerated procedure  well and was no apparent complications.   IMPRESSION:  1.  Family history of colon cancer without worrisome findings on current      examination (V16.0)  2.  Scattered diverticulosis, predominantly sigmoid.   PLAN:  Follow-up colonoscopy five years because of  the family history.      RB/MEDQ  D:  11/23/2004  T:  11/23/2004  Job:  [id number]   cc:   Ike Bene, M.D.  [address]  [place]  [place] [id number]  Fax: [phone]   Gretta Cool, M.D.  [address] [place]  [place] [address]  Fax: [phone]

## 2011-04-28 ENCOUNTER — Other Ambulatory Visit: Payer: Self-pay | Admitting: Internal Medicine

## 2011-04-28 NOTE — Telephone Encounter (Signed)
To robin for routines 

## 2013-01-24 ENCOUNTER — Encounter (INDEPENDENT_AMBULATORY_CARE_PROVIDER_SITE_OTHER): Payer: Medicare HMO | Admitting: *Deleted

## 2013-01-24 DIAGNOSIS — M79609 Pain in unspecified limb: Secondary | ICD-10-CM

## 2013-01-24 DIAGNOSIS — R252 Cramp and spasm: Secondary | ICD-10-CM

## 2013-07-24 ENCOUNTER — Other Ambulatory Visit (HOSPITAL_COMMUNITY): Payer: Self-pay | Admitting: Internal Medicine

## 2013-07-24 DIAGNOSIS — R599 Enlarged lymph nodes, unspecified: Secondary | ICD-10-CM

## 2013-07-24 DIAGNOSIS — J984 Other disorders of lung: Secondary | ICD-10-CM

## 2013-08-06 ENCOUNTER — Encounter (HOSPITAL_COMMUNITY)
Admission: RE | Admit: 2013-08-06 | Discharge: 2013-08-06 | Disposition: A | Discharge status: Home / Self Care | Payer: Medicare HMO | Source: Ambulatory Visit | Attending: Internal Medicine | Admitting: Internal Medicine

## 2013-08-06 DIAGNOSIS — J984 Other disorders of lung: Secondary | ICD-10-CM | POA: Insufficient documentation

## 2013-08-06 DIAGNOSIS — R599 Enlarged lymph nodes, unspecified: Secondary | ICD-10-CM | POA: Insufficient documentation

## 2013-08-06 LAB — GLUCOSE, CAPILLARY: Glucose-Capillary: 184 mg/dL — ABNORMAL HIGH (ref 70–99)

## 2013-08-06 MED ORDER — FLUDEOXYGLUCOSE F - 18 (FDG) INJECTION
15.9000 | Freq: Once | INTRAVENOUS | Status: AC | PRN
  Administered 2013-08-06: 15.9 via INTRAVENOUS

## 2013-08-07 ENCOUNTER — Other Ambulatory Visit (HOSPITAL_COMMUNITY): Payer: Self-pay | Admitting: Internal Medicine

## 2013-08-07 DIAGNOSIS — C7900 Secondary malignant neoplasm of unspecified kidney and renal pelvis: Secondary | ICD-10-CM

## 2013-08-14 ENCOUNTER — Ambulatory Visit (HOSPITAL_COMMUNITY)
Admission: RE | Admit: 2013-08-14 | Discharge: 2013-08-14 | Disposition: A | Discharge status: Home / Self Care | Payer: Medicare HMO | Source: Ambulatory Visit | Attending: Internal Medicine | Admitting: Internal Medicine

## 2013-08-14 DIAGNOSIS — I823 Embolism and thrombosis of renal vein: Secondary | ICD-10-CM | POA: Insufficient documentation

## 2013-08-14 DIAGNOSIS — N289 Disorder of kidney and ureter, unspecified: Secondary | ICD-10-CM | POA: Insufficient documentation

## 2013-08-14 DIAGNOSIS — Z85118 Personal history of other malignant neoplasm of bronchus and lung: Secondary | ICD-10-CM | POA: Insufficient documentation

## 2013-08-14 DIAGNOSIS — K769 Liver disease, unspecified: Secondary | ICD-10-CM | POA: Insufficient documentation

## 2013-08-14 DIAGNOSIS — C7900 Secondary malignant neoplasm of unspecified kidney and renal pelvis: Secondary | ICD-10-CM

## 2013-08-14 MED ORDER — GADOBENATE DIMEGLUMINE 529 MG/ML IV SOLN
15.0000 mL | Freq: Once | INTRAVENOUS | Status: AC | PRN
  Administered 2013-08-14: 12 mL via INTRAVENOUS

## 2013-08-18 ENCOUNTER — Other Ambulatory Visit (HOSPITAL_COMMUNITY): Payer: Self-pay | Admitting: Urology

## 2013-08-18 ENCOUNTER — Inpatient Hospital Stay
Admission: RE | Admit: 2013-08-18 | Discharge: 2013-08-18 | Disposition: A | Discharge status: Home / Self Care | Payer: Self-pay | Source: Ambulatory Visit | Attending: Urology | Admitting: Urology

## 2013-08-18 DIAGNOSIS — K635 Polyp of colon: Secondary | ICD-10-CM

## 2013-08-28 ENCOUNTER — Other Ambulatory Visit: Payer: Self-pay | Admitting: Urology

## 2013-09-01 ENCOUNTER — Other Ambulatory Visit: Payer: Self-pay | Admitting: Urology

## 2013-09-02 ENCOUNTER — Encounter (INDEPENDENT_AMBULATORY_CARE_PROVIDER_SITE_OTHER): Payer: Self-pay | Admitting: Surgery

## 2013-09-03 ENCOUNTER — Encounter (INDEPENDENT_AMBULATORY_CARE_PROVIDER_SITE_OTHER): Payer: Self-pay | Admitting: Surgery

## 2013-09-03 ENCOUNTER — Ambulatory Visit (INDEPENDENT_AMBULATORY_CARE_PROVIDER_SITE_OTHER): Payer: Medicare HMO | Admitting: Surgery

## 2013-09-03 ENCOUNTER — Encounter (INDEPENDENT_AMBULATORY_CARE_PROVIDER_SITE_OTHER): Payer: Self-pay

## 2013-09-03 VITALS — BP 116/64 | HR 84 | Resp 16 | Ht 67.0 in | Wt 129.4 lb

## 2013-09-03 DIAGNOSIS — K4090 Unilateral inguinal hernia, without obstruction or gangrene, not specified as recurrent: Secondary | ICD-10-CM

## 2013-09-03 MED ORDER — OXYCODONE-ACETAMINOPHEN 5-325 MG PO TABS: 1.0000 | ORAL_TABLET | ORAL | Status: DC | PRN

## 2013-09-03 NOTE — Progress Notes (Signed)
Patient ID: Kaylee [name], female   DOB: 1941/04/22, 72 y.o.   MRN: [id number]  Chief Complaint  Patient presents with  . New Evaluation    eval left ing hernia    HPI Kaylee [name] is a 72 y.o. female.  PCP - Dr. Timothy Lasso  HPI This is a 72 yo female who was seen about three years ago with a small asymptomatic left inguinal hernia.  At that time, she decided not to have any surgical repair. Over the last few weeks, this area has become more tender, with pain radiating across her pelvis.  The bulge has not become any larger.  She denies any GI obstructive symptoms.    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  She is scheduled for left nephrectomy by Dr. Dwana Curd in about two weeks.    Past Medical History  Diagnosis Date  . Depression   . Back pain   . Cancer     Past Surgical History  Procedure Laterality Date  . Back surgery    . Lung surgery    . Breast surgery    . Abdominal hysterectomy      Family History  Problem Relation Age of Onset  . Cancer Mother   . Heart disease Father     Social History History  Substance Use Topics  . Smoking status: Current Every Day Smoker -- 1.00 packs/day    Types: Cigarettes  . Smokeless tobacco: Never Used  . Alcohol Use: No    Allergies  Allergen Reactions  . Codeine   . Penicillins     Current Outpatient Prescriptions  Medication Sig Dispense Refill  . omeprazole (PRILOSEC) 40 MG capsule Take 40 mg by mouth daily.      Marland Kitchen PARoxetine (PAXIL) 20 MG tablet TAKE 1 TABLET BY MOUTH ONCE DAILY  90 tablet  3  . oxyCODONE-acetaminophen (PERCOCET/ROXICET) 5-325 MG per tablet Take 1 tablet by mouth every 4 (four) hours as needed for severe pain.  40 tablet  0   No current facility-administered medications for this visit.    Review of Systems Review of Systems  Constitutional: Negative for fever, chills and unexpected weight change.  HENT:  Negative for congestion, hearing loss, sore throat, trouble swallowing and voice change.   Eyes: Negative for visual disturbance.  Respiratory: Negative for cough and wheezing.   Cardiovascular: Negative for chest pain, palpitations and leg swelling.  Gastrointestinal: Positive for abdominal pain. Negative for nausea, vomiting, diarrhea, constipation, blood in stool, abdominal distention and anal bleeding.  Genitourinary: Negative for hematuria, vaginal bleeding and difficulty urinating.  Musculoskeletal: Negative for arthralgias.  Skin: Negative for rash and wound.  Neurological: Negative for seizures, syncope and headaches.  Hematological: Negative for adenopathy. Does not bruise/bleed easily.  Psychiatric/Behavioral: Negative for confusion.    Blood pressure 116/64, pulse 84, resp. rate 16, height 5\' 7"  (1.702 m), weight 129 lb 6.4 oz (58.695 kg).  Physical Exam Physical Exam WDWN in NAD HEENT:  EOMI, sclera anicteric Neck:  No masses, no thyromegaly Lungs:  CTA bilaterally; normal respiratory effort CV:  Regular rate and rhythm; no murmurs Abd:  +bowel sounds, soft, healed Pfannenstiel incision.  At the left edge of her incision, there is a slight bulge that is easily reducible Ext:  Well-perfused; no edema Skin:  Warm, dry; no sign of jaundice  Data Reviewed Mr Abdomen W Wo Contrast  08/14/2013   CLINICAL DATA:  Left  renal mass. Lung carcinoma.  EXAM: MRI ABDOMEN WITH AND WITHOUT CONTRAST  TECHNIQUE: Multiplanar multisequence MR imaging of the abdomen was performed both before and after administration of intravenous contrast.  CONTRAST:  12mL MULTIHANCE GADOBENATE DIMEGLUMINE 529 MG/ML IV SOLN  COMPARISON:  PET-CT on 08/06/2013  FINDINGS: Poorly defined heterogeneous enhancing soft tissue mass is seen involving the right the posterior interpolar region of the left kidney which measures 5.1 x 5.4 cm, consistent renal cell carcinoma. Heterogeneously enhancing tumor thrombus is also  seen within the left renal vein measuring approximately 3.5 x 5.6 cm. This extends to the midline within the left renal vein, but does not cross midline or extend into the IVC. No separate retroperitoneal lymphadenopathy identified. There is no evidence of right renal mass.  A solitary 1.1 cm hypervascular mass is seen in the anterior aspect of the lateral segment of the left hepatic lobe on image 13 of series 1001. This lesion shows rapid contrast washout and ring pattern of enhancement, highly suspicious for a hypervascular metastasis. No other liver masses are identified.  The pancreas, spleen, and adrenal glands are normal in appearance. No other abdominal lymphadenopathy identified. No evidence of inflammatory process or ascites. The  IMPRESSION: 5.4 cm heterogeneously enhancing mass seen in the midpole left kidney, consistent with renal cell carcinoma.  Enhancing tumor thrombus within the left renal vein, which extends to the midline.  1.1 cm hypervascular mass in the lateral segment of the left hepatic lobe, highly suspicious for liver metastasis.   Electronically Signed   By: Myles Rosenthal M.D.   On: 08/14/2013 15:40   Nm Pet Image Initial (pi) Skull Base To Thigh  08/06/2013   CLINICAL DATA:  Subsequent treatment strategy for Lung cancer.  EXAM: NUCLEAR MEDICINE PET SKULL BASE TO THIGH  FASTING BLOOD GLUCOSE:  Value:  184 mg/dl  TECHNIQUE: 96.0 mCi A-54 FDG was injected intravenously. CT data was obtained and used for attenuation correction and anatomic localization only. (This was not acquired as a diagnostic CT examination.) Additional exam technical data entered on technologist worksheet.  COMPARISON:  CT chest 03/10/2013  FINDINGS: NECK  No hypermetabolic lymph nodes in the neck.  CHEST  Postsurgical change and volume loss is noted within the right hemi thorax. Moderate changes of paraseptal emphysema identified. Pulmonary nodule within the anterior left upper lobe is identified and appears new from  previous exam measuring 6 mm, image 97/series 2. This is too small to reliably characterize. New solid nodule is identified within the anterior right lung base measuring 3 mm, image 106/series 2. This is also too small to characterize by PET-CT. Right upper lobe nodule measures 5 mm, image 79/series 2. This is new from previous exam and is too small to characterize.  There is no abnormal FDG uptake associated with the borderline mediastinal lymph nodes described on previous CT of the chest. Bilateral breast implants with capsular calcification noted.  ABDOMEN/PELVIS  There is a mass involving much of the mid and inferior pole of the left kidney. This is slightly hyperdense on the noncontrast CT images. Evidence of tumor extension into the left renal vein is identified.  SKELETON  No focal hypermetabolic activity to suggest skeletal metastasis.  IMPRESSION: 1. There is a mass involving the left kidney with apparent vascular invasion into the left to renal pain. This is concerning for a renal cell carcinoma. Advise further evaluation with contrast enhanced cross-sectional imaging of the upper abdomen.  2. Interval development of multi focal pulmonary nodules. These  are too small to characterize but are new from 03/10/2013. Findings are concerning for pulmonary metastasis.  3. There is no malignant range FDG uptake associated with the mediastinal lymph nodes.  The results were called by telephone at the time of interpretation on 08/06/2013 at 3:05 PM to Dr.Patterson, who verbally acknowledged these results.   Electronically Signed   By: Signa Kell M.D.   On: 08/06/2013 15:06      Assessment    Persistent left groin hernia, possibly an incisional hernia from her previous hysterectomy.  This seems to be more tender than before, but there is no sign of incarceration or strangulation.  I would not recommend hernia repair until she has had her nephrectomy plus any possible adjuvant treatment that might be  required.    Plan    We will reevaluate the patient in three months, after she has had her surgery and treatment.        Evanie Buckle K. 09/03/2013, 2:44 PM

## 2013-09-08 ENCOUNTER — Encounter (HOSPITAL_COMMUNITY): Payer: Self-pay | Admitting: Pharmacy Technician

## 2013-09-08 ENCOUNTER — Other Ambulatory Visit (HOSPITAL_COMMUNITY): Payer: Self-pay | Admitting: Urology

## 2013-09-08 NOTE — Progress Notes (Signed)
Chest ct 03-10-13 [facility] on chart

## 2013-09-09 ENCOUNTER — Encounter (HOSPITAL_COMMUNITY): Payer: Self-pay

## 2013-09-09 ENCOUNTER — Encounter (HOSPITAL_COMMUNITY)
Admission: RE | Admit: 2013-09-09 | Discharge: 2013-09-09 | Disposition: A | Discharge status: Home / Self Care | Payer: Medicare HMO | Source: Ambulatory Visit | Attending: Urology | Admitting: Urology

## 2013-09-09 DIAGNOSIS — Z01818 Encounter for other preprocedural examination: Secondary | ICD-10-CM | POA: Insufficient documentation

## 2013-09-09 DIAGNOSIS — Z01812 Encounter for preprocedural laboratory examination: Secondary | ICD-10-CM | POA: Insufficient documentation

## 2013-09-09 HISTORY — DX: Chronic obstructive pulmonary disease, unspecified: J44.9

## 2013-09-09 HISTORY — DX: Unspecified abdominal hernia without obstruction or gangrene: K46.9

## 2013-09-09 HISTORY — DX: Headache: R51

## 2013-09-09 HISTORY — DX: Sleep disorder, unspecified: G47.9

## 2013-09-09 HISTORY — DX: Nocturia: R35.1

## 2013-09-09 HISTORY — DX: Urgency of urination: R39.15

## 2013-09-09 HISTORY — DX: Frequency of micturition: R35.0

## 2013-09-09 HISTORY — DX: Gastro-esophageal reflux disease without esophagitis: K21.9

## 2013-09-09 LAB — BASIC METABOLIC PANEL
CO2: 24 mEq/L (ref 19–32)
Calcium: 9.5 mg/dL (ref 8.4–10.5)
Creatinine, Ser: 0.76 mg/dL (ref 0.50–1.10)
GFR calc Af Amer: >90 mL/min (ref >90)
GFR calc non Af Amer: 81 mL/min — ABNORMAL LOW (ref >90)
Sodium: 131 mEq/L — ABNORMAL LOW (ref 135–145)

## 2013-09-09 LAB — CBC
Hemoglobin: 10.6 g/dL — ABNORMAL LOW (ref 12.0–15.0)
Platelets: 354 10*3/uL (ref 150–400)
RBC: 4.06 MIL/uL (ref 3.87–5.11)
WBC: 11.6 10*3/uL — ABNORMAL HIGH (ref 4.0–10.5)

## 2013-09-09 LAB — ABO/RH: ABO/RH(D): O POS

## 2013-09-09 NOTE — Patient Instructions (Addendum)
Tyquisha M Broadus  09/09/2013                           YOUR PROCEDURE IS SCHEDULED ON:  09/15/13               PLEASE REPORT TO SHORT STAY CENTER AT : 9:00 AM               CALL THIS NUMBER IF ANY PROBLEMS THE DAY OF SURGERY :               [phone]                      REMEMBER:   Do not eat food or drink liquids AFTER MIDNIGHT   Take these medicines the morning of surgery with A SIP OF WATER:  NONE   Do not wear jewelry, make-up   Do not wear lotions, powders, or perfumes.   Do not shave legs or underarms 12 hrs. before surgery (men may shave face)  Do not bring valuables to the hospital.  Contacts, dentures or bridgework may not be worn into surgery.  Leave suitcase in the car. After surgery it may be brought to your room.  For patients admitted to the hospital more than one night, checkout time is 11:00                          The day of discharge.   Patients discharged the day of surgery will not be allowed to drive home                             If going home same day of surgery, must have someone stay with you first                           24 hrs at home and arrange for some one to drive you home from hospital.    Special Instructions:   Please read over the following fact sheets that you were given:                                 1. [place] PREPARING FOR SURGERY SHEET                2. FOLLOW BOWEL PREP                                                X_____________________________________________________________________        Failure to follow these instructions may result in cancellation of your surgery

## 2013-09-15 ENCOUNTER — Inpatient Hospital Stay (HOSPITAL_COMMUNITY): Payer: Medicare HMO | Admitting: Anesthesiology

## 2013-09-15 ENCOUNTER — Encounter (HOSPITAL_COMMUNITY)
Admission: RE | Disposition: A | Discharge status: Home / Self Care | Payer: Self-pay | Source: Ambulatory Visit | Attending: Urology

## 2013-09-15 ENCOUNTER — Inpatient Hospital Stay (HOSPITAL_COMMUNITY)
Admission: RE | Admit: 2013-09-15 | Discharge: 2013-09-17 | DRG: 657 | Disposition: A | Discharge status: Home / Self Care | Payer: Medicare HMO | Source: Ambulatory Visit | Attending: Urology | Admitting: Urology

## 2013-09-15 ENCOUNTER — Other Ambulatory Visit: Payer: Self-pay

## 2013-09-15 ENCOUNTER — Encounter (HOSPITAL_COMMUNITY): Payer: Medicare HMO | Admitting: Anesthesiology

## 2013-09-15 ENCOUNTER — Encounter (HOSPITAL_COMMUNITY): Payer: Self-pay | Admitting: *Deleted

## 2013-09-15 DIAGNOSIS — I7411 Embolism and thrombosis of thoracic aorta: Secondary | ICD-10-CM | POA: Diagnosis present

## 2013-09-15 DIAGNOSIS — C649 Malignant neoplasm of unspecified kidney, except renal pelvis: Principal | ICD-10-CM | POA: Diagnosis present

## 2013-09-15 DIAGNOSIS — C50919 Malignant neoplasm of unspecified site of unspecified female breast: Secondary | ICD-10-CM | POA: Diagnosis present

## 2013-09-15 DIAGNOSIS — J4489 Other specified chronic obstructive pulmonary disease: Secondary | ICD-10-CM | POA: Diagnosis present

## 2013-09-15 DIAGNOSIS — F17200 Nicotine dependence, unspecified, uncomplicated: Secondary | ICD-10-CM | POA: Diagnosis present

## 2013-09-15 DIAGNOSIS — R000 Tachycardia, unspecified: Secondary | ICD-10-CM | POA: Diagnosis not present

## 2013-09-15 DIAGNOSIS — Z902 Acquired absence of lung [part of]: Secondary | ICD-10-CM

## 2013-09-15 DIAGNOSIS — R339 Retention of urine, unspecified: Secondary | ICD-10-CM | POA: Diagnosis not present

## 2013-09-15 DIAGNOSIS — Z85118 Personal history of other malignant neoplasm of bronchus and lung: Secondary | ICD-10-CM

## 2013-09-15 DIAGNOSIS — F329 Major depressive disorder, single episode, unspecified: Secondary | ICD-10-CM | POA: Diagnosis present

## 2013-09-15 DIAGNOSIS — Z8249 Family history of ischemic heart disease and other diseases of the circulatory system: Secondary | ICD-10-CM

## 2013-09-15 DIAGNOSIS — E119 Type 2 diabetes mellitus without complications: Secondary | ICD-10-CM | POA: Diagnosis present

## 2013-09-15 DIAGNOSIS — R350 Frequency of micturition: Secondary | ICD-10-CM | POA: Diagnosis present

## 2013-09-15 DIAGNOSIS — Z01812 Encounter for preprocedural laboratory examination: Secondary | ICD-10-CM

## 2013-09-15 DIAGNOSIS — F3289 Other specified depressive episodes: Secondary | ICD-10-CM | POA: Diagnosis present

## 2013-09-15 DIAGNOSIS — K219 Gastro-esophageal reflux disease without esophagitis: Secondary | ICD-10-CM | POA: Diagnosis present

## 2013-09-15 DIAGNOSIS — J449 Chronic obstructive pulmonary disease, unspecified: Secondary | ICD-10-CM | POA: Diagnosis present

## 2013-09-15 HISTORY — PX: ROBOT ASSISTED LAPAROSCOPIC NEPHRECTOMY: SHX5140

## 2013-09-15 LAB — BASIC METABOLIC PANEL WITH GFR
BUN: 15 mg/dL (ref 6–23)
CO2: 24 meq/L (ref 19–32)
Calcium: 8.6 mg/dL (ref 8.4–10.5)
Chloride: 100 meq/L (ref 96–112)
Creatinine, Ser: 0.78 mg/dL (ref 0.50–1.10)
GFR calc Af Amer: >90 mL/min
GFR calc non Af Amer: 80 mL/min — ABNORMAL LOW
Glucose, Bld: 244 mg/dL — ABNORMAL HIGH (ref 70–99)
Potassium: 4.6 meq/L (ref 3.5–5.1)
Sodium: 132 meq/L — ABNORMAL LOW (ref 135–145)

## 2013-09-15 LAB — HEMOGLOBIN AND HEMATOCRIT, BLOOD
HCT: 27.6 % — ABNORMAL LOW (ref 36.0–46.0)
Hemoglobin: 8.7 g/dL — ABNORMAL LOW (ref 12.0–15.0)

## 2013-09-15 SURGERY — ROBOTIC ASSISTED LAPAROSCOPIC NEPHRECTOMY
Anesthesia: General | Laterality: Left | Wound class: Clean Contaminated

## 2013-09-15 MED ORDER — PROMETHAZINE HCL 25 MG/ML IJ SOLN: 6.2500 mg | INTRAMUSCULAR | Status: DC | PRN

## 2013-09-15 MED ORDER — CLINDAMYCIN PHOSPHATE 900 MG/50ML IV SOLN
900.0000 mg | Freq: Once | INTRAVENOUS | Status: AC
  Administered 2013-09-15: 900 mg via INTRAVENOUS
  Filled 2013-09-15: qty 50

## 2013-09-15 MED ORDER — ONDANSETRON HCL 4 MG/2ML IJ SOLN
INTRAMUSCULAR | Status: DC | PRN
  Administered 2013-09-15: 4 mg via INTRAVENOUS

## 2013-09-15 MED ORDER — LABETALOL HCL 5 MG/ML IV SOLN
INTRAVENOUS | Status: AC
  Filled 2013-09-15: qty 4

## 2013-09-15 MED ORDER — PROPOFOL 10 MG/ML IV BOLUS
INTRAVENOUS | Status: DC | PRN
  Administered 2013-09-15: 140 mg via INTRAVENOUS

## 2013-09-15 MED ORDER — HYDROMORPHONE HCL PF 1 MG/ML IJ SOLN
INTRAMUSCULAR | Status: AC
  Filled 2013-09-15: qty 1

## 2013-09-15 MED ORDER — SENNA 8.6 MG PO TABS
1.0000 | ORAL_TABLET | Freq: Two times a day (BID) | ORAL | Status: DC
  Administered 2013-09-15 – 2013-09-17 (×4): 8.6 mg via ORAL
  Filled 2013-09-15 (×4): qty 1

## 2013-09-15 MED ORDER — ROCURONIUM BROMIDE 100 MG/10ML IV SOLN
INTRAVENOUS | Status: DC | PRN
  Administered 2013-09-15: 45 mg via INTRAVENOUS

## 2013-09-15 MED ORDER — BUPIVACAINE LIPOSOME 1.3 % IJ SUSP
INTRAMUSCULAR | Status: DC | PRN
  Administered 2013-09-15: 20 mL

## 2013-09-15 MED ORDER — ACETAMINOPHEN 500 MG PO TABS
1000.0000 mg | ORAL_TABLET | Freq: Four times a day (QID) | ORAL | Status: AC
  Administered 2013-09-15 – 2013-09-16 (×3): 1000 mg via ORAL
  Filled 2013-09-15 (×3): qty 2

## 2013-09-15 MED ORDER — HYDROMORPHONE HCL PF 1 MG/ML IJ SOLN
0.5000 mg | INTRAMUSCULAR | Status: DC | PRN
  Administered 2013-09-15 (×3): 0.5 mg via INTRAVENOUS
  Administered 2013-09-16 (×2): 1 mg via INTRAVENOUS
  Administered 2013-09-16: 0.5 mg via INTRAVENOUS
  Administered 2013-09-16 – 2013-09-17 (×6): 1 mg via INTRAVENOUS
  Filled 2013-09-15 (×12): qty 1

## 2013-09-15 MED ORDER — CISATRACURIUM BESYLATE (PF) 10 MG/5ML IV SOLN
INTRAVENOUS | Status: DC | PRN
  Administered 2013-09-15 (×2): 2 mg via INTRAVENOUS
  Administered 2013-09-15: 4 mg via INTRAVENOUS

## 2013-09-15 MED ORDER — LACTATED RINGERS IR SOLN
Status: DC | PRN
  Administered 2013-09-15: 1000 mL

## 2013-09-15 MED ORDER — LACTATED RINGERS IV SOLN: INTRAVENOUS | Status: DC

## 2013-09-15 MED ORDER — PAROXETINE HCL 20 MG PO TABS
20.0000 mg | ORAL_TABLET | Freq: Every evening | ORAL | Status: DC
Start: 2013-09-15 — End: 2013-09-17
  Administered 2013-09-15 – 2013-09-16 (×2): 20 mg via ORAL
  Filled 2013-09-15 (×3): qty 1

## 2013-09-15 MED ORDER — NEOSTIGMINE METHYLSULFATE 1 MG/ML IJ SOLN
INTRAMUSCULAR | Status: DC | PRN
  Administered 2013-09-15: 4 mg via INTRAVENOUS

## 2013-09-15 MED ORDER — MIDAZOLAM HCL 5 MG/5ML IJ SOLN
INTRAMUSCULAR | Status: DC | PRN
  Administered 2013-09-15 (×2): 1 mg via INTRAVENOUS

## 2013-09-15 MED ORDER — LIDOCAINE HCL (CARDIAC) 20 MG/ML IV SOLN
INTRAVENOUS | Status: DC | PRN
  Administered 2013-09-15: 60 mg via INTRAVENOUS

## 2013-09-15 MED ORDER — ESMOLOL HCL 10 MG/ML IV SOLN
INTRAVENOUS | Status: DC | PRN
  Administered 2013-09-15: 20 mg via INTRAVENOUS

## 2013-09-15 MED ORDER — SODIUM CHLORIDE 0.9 % IJ SOLN
INTRAMUSCULAR | Status: DC | PRN
  Administered 2013-09-15: 20 mL

## 2013-09-15 MED ORDER — LIDOCAINE HCL (CARDIAC) 20 MG/ML IV SOLN
INTRAVENOUS | Status: AC
  Filled 2013-09-15: qty 5

## 2013-09-15 MED ORDER — SODIUM CHLORIDE 0.9 % IV SOLN
INTRAVENOUS | Status: DC | PRN
  Administered 2013-09-15 (×2): via INTRAVENOUS

## 2013-09-15 MED ORDER — DOCUSATE SODIUM 100 MG PO CAPS
100.0000 mg | ORAL_CAPSULE | Freq: Two times a day (BID) | ORAL | Status: DC
  Administered 2013-09-15 – 2013-09-17 (×4): 100 mg via ORAL
  Filled 2013-09-15 (×5): qty 1

## 2013-09-15 MED ORDER — HYDROMORPHONE HCL PF 1 MG/ML IJ SOLN
0.2500 mg | INTRAMUSCULAR | Status: DC | PRN
  Administered 2013-09-15 (×4): 0.5 mg via INTRAVENOUS

## 2013-09-15 MED ORDER — PANTOPRAZOLE SODIUM 40 MG PO TBEC
80.0000 mg | DELAYED_RELEASE_TABLET | Freq: Every day | ORAL | Status: DC
  Administered 2013-09-15 – 2013-09-17 (×3): 80 mg via ORAL
  Filled 2013-09-15 (×3): qty 2

## 2013-09-15 MED ORDER — SODIUM CHLORIDE 0.9 % IJ SOLN
INTRAMUSCULAR | Status: AC
  Filled 2013-09-15: qty 10

## 2013-09-15 MED ORDER — SUFENTANIL CITRATE 50 MCG/ML IV SOLN
INTRAVENOUS | Status: AC
  Filled 2013-09-15: qty 1

## 2013-09-15 MED ORDER — CLINDAMYCIN PHOSPHATE 900 MG/50ML IV SOLN
INTRAVENOUS | Status: AC
  Filled 2013-09-15: qty 50

## 2013-09-15 MED ORDER — MENTHOL 3 MG MT LOZG
1.0000 | LOZENGE | OROMUCOSAL | Status: DC | PRN
  Administered 2013-09-16: 3 mg via ORAL
  Filled 2013-09-15: qty 9

## 2013-09-15 MED ORDER — DEXAMETHASONE SODIUM PHOSPHATE 10 MG/ML IJ SOLN
INTRAMUSCULAR | Status: AC
  Filled 2013-09-15: qty 1

## 2013-09-15 MED ORDER — SUFENTANIL CITRATE 50 MCG/ML IV SOLN
INTRAVENOUS | Status: DC | PRN
  Administered 2013-09-15: 10 ug via INTRAVENOUS
  Administered 2013-09-15 (×3): 5 ug via INTRAVENOUS

## 2013-09-15 MED ORDER — PHENYLEPHRINE HCL 10 MG/ML IJ SOLN
INTRAMUSCULAR | Status: DC | PRN
  Administered 2013-09-15: 80 ug via INTRAVENOUS

## 2013-09-15 MED ORDER — OXYCODONE HCL 5 MG PO TABS
5.0000 mg | ORAL_TABLET | ORAL | Status: DC | PRN
  Administered 2013-09-15 – 2013-09-17 (×7): 5 mg via ORAL
  Filled 2013-09-15 (×7): qty 1

## 2013-09-15 MED ORDER — METOCLOPRAMIDE HCL 5 MG/ML IJ SOLN
INTRAMUSCULAR | Status: AC
  Filled 2013-09-15: qty 2

## 2013-09-15 MED ORDER — MIDAZOLAM HCL 2 MG/2ML IJ SOLN
INTRAMUSCULAR | Status: AC
  Filled 2013-09-15: qty 2

## 2013-09-15 MED ORDER — ROCURONIUM BROMIDE 100 MG/10ML IV SOLN
INTRAVENOUS | Status: AC
  Filled 2013-09-15: qty 1

## 2013-09-15 MED ORDER — HYDROMORPHONE HCL PF 1 MG/ML IJ SOLN
INTRAMUSCULAR | Status: DC | PRN
  Administered 2013-09-15 (×2): 0.5 mg via INTRAVENOUS
  Administered 2013-09-15: 1 mg via INTRAVENOUS

## 2013-09-15 MED ORDER — ONDANSETRON HCL 4 MG/2ML IJ SOLN
4.0000 mg | INTRAMUSCULAR | Status: DC | PRN
  Administered 2013-09-16 – 2013-09-17 (×3): 4 mg via INTRAVENOUS
  Filled 2013-09-15 (×3): qty 2

## 2013-09-15 MED ORDER — PROPOFOL 10 MG/ML IV BOLUS
INTRAVENOUS | Status: AC
  Filled 2013-09-15: qty 20

## 2013-09-15 MED ORDER — CIPROFLOXACIN IN D5W 400 MG/200ML IV SOLN
INTRAVENOUS | Status: AC
  Filled 2013-09-15: qty 200

## 2013-09-15 MED ORDER — BUPIVACAINE LIPOSOME 1.3 % IJ SUSP
20.0000 mL | Freq: Once | INTRAMUSCULAR | Status: DC
  Filled 2013-09-15: qty 20

## 2013-09-15 MED ORDER — CISATRACURIUM BESYLATE 20 MG/10ML IV SOLN
INTRAVENOUS | Status: AC
  Filled 2013-09-15: qty 10

## 2013-09-15 MED ORDER — METOCLOPRAMIDE HCL 5 MG/ML IJ SOLN
INTRAMUSCULAR | Status: DC | PRN
  Administered 2013-09-15: 10 mg via INTRAVENOUS

## 2013-09-15 MED ORDER — GLYCOPYRROLATE 0.2 MG/ML IJ SOLN
INTRAMUSCULAR | Status: DC | PRN
  Administered 2013-09-15: 0.6 mg via INTRAVENOUS

## 2013-09-15 MED ORDER — ONDANSETRON HCL 4 MG/2ML IJ SOLN
INTRAMUSCULAR | Status: AC
  Filled 2013-09-15: qty 2

## 2013-09-15 MED ORDER — CIPROFLOXACIN IN D5W 400 MG/200ML IV SOLN
400.0000 mg | Freq: Two times a day (BID) | INTRAVENOUS | Status: DC
  Administered 2013-09-15: 400 mg via INTRAVENOUS

## 2013-09-15 MED ORDER — KCL IN DEXTROSE-NACL 20-5-0.45 MEQ/L-%-% IV SOLN
INTRAVENOUS | Status: DC
  Administered 2013-09-15 – 2013-09-16 (×3): via INTRAVENOUS
  Filled 2013-09-15 (×4): qty 1000

## 2013-09-15 MED ORDER — HYDROMORPHONE HCL PF 2 MG/ML IJ SOLN
INTRAMUSCULAR | Status: AC
  Filled 2013-09-15: qty 1

## 2013-09-15 MED ORDER — SODIUM CHLORIDE 0.9 % IJ SOLN
INTRAMUSCULAR | Status: AC
  Filled 2013-09-15: qty 20

## 2013-09-15 MED ORDER — STERILE WATER FOR IRRIGATION IR SOLN
Status: DC | PRN
  Administered 2013-09-15: 3000 mL

## 2013-09-15 MED ORDER — LABETALOL HCL 5 MG/ML IV SOLN
INTRAVENOUS | Status: DC | PRN
  Administered 2013-09-15 (×2): 2.5 mg via INTRAVENOUS
  Administered 2013-09-15 (×3): 5 mg via INTRAVENOUS

## 2013-09-15 MED ORDER — LACTATED RINGERS IV SOLN
INTRAVENOUS | Status: DC | PRN
  Administered 2013-09-15 (×2): via INTRAVENOUS

## 2013-09-15 MED ORDER — PHENOL 1.4 % MT LIQD
1.0000 | OROMUCOSAL | Status: DC | PRN
  Administered 2013-09-16: 1 via OROMUCOSAL
  Filled 2013-09-15: qty 177

## 2013-09-15 SURGICAL SUPPLY — 58 items
ADH SKN CLS APL DERMABOND .7 (GAUZE/BANDAGES/DRESSINGS) ×2
APL ESCP 34 STRL LF DISP (HEMOSTASIS)
APPLICATOR SURGIFLO ENDO (HEMOSTASIS) IMPLANT
CANNULA SEAL DVNC (CANNULA) ×3 IMPLANT
CANNULA SEALS DA VINCI (CANNULA) ×3
CHLORAPREP W/TINT 26ML (MISCELLANEOUS) ×2 IMPLANT
CLIP LIGATING HEM O LOK PURPLE (MISCELLANEOUS) IMPLANT
CLIP LIGATING HEMO LOK XL GOLD (MISCELLANEOUS) ×5 IMPLANT
CLIP LIGATING HEMO O LOK GREEN (MISCELLANEOUS) IMPLANT
CORDS BIPOLAR (ELECTRODE) ×2 IMPLANT
COVER SURGICAL LIGHT HANDLE (MISCELLANEOUS) ×2 IMPLANT
COVER TIP SHEARS 8 DVNC (MISCELLANEOUS) ×1 IMPLANT
COVER TIP SHEARS 8MM DA VINCI (MISCELLANEOUS) ×1
CUTTER ECHEON FLEX ENDO 45 340 (ENDOMECHANICALS) ×1 IMPLANT
DECANTER SPIKE VIAL GLASS SM (MISCELLANEOUS) ×2 IMPLANT
DERMABOND ADVANCED (GAUZE/BANDAGES/DRESSINGS) ×2
DERMABOND ADVANCED .7 DNX12 (GAUZE/BANDAGES/DRESSINGS) ×2 IMPLANT
DRAIN CHANNEL 15F RND FF 3/16 (WOUND CARE) ×2 IMPLANT
DRAPE INCISE IOBAN 66X45 STRL (DRAPES) ×2 IMPLANT
DRAPE LAPAROSCOPIC ABDOMINAL (DRAPES) ×2 IMPLANT
DRAPE LG THREE QUARTER DISP (DRAPES) ×4 IMPLANT
DRAPE TABLE BACK 44X90 PK DISP (DRAPES) ×2 IMPLANT
DRAPE WARM FLUID 44X44 (DRAPE) ×2 IMPLANT
ELECT REM PT RETURN 9FT ADLT (ELECTROSURGICAL) ×4
ELECTRODE REM PT RTRN 9FT ADLT (ELECTROSURGICAL) ×2 IMPLANT
EVACUATOR SILICONE 100CC (DRAIN) ×2 IMPLANT
GLOVE BIOGEL M STRL SZ7.5 (GLOVE) ×4 IMPLANT
GOWN PREVENTION PLUS LG XLONG (DISPOSABLE) ×4 IMPLANT
GOWN STRL REIN XL XLG (GOWN DISPOSABLE) ×2 IMPLANT
KIT ACCESSORY DA VINCI DISP (KITS) ×1
KIT ACCESSORY DVNC DISP (KITS) ×1 IMPLANT
KIT BASIN OR (CUSTOM PROCEDURE TRAY) ×2 IMPLANT
NDL INSUFFLATION 14GA 120MM (NEEDLE) ×1 IMPLANT
NEEDLE INSUFFLATION 14GA 120MM (NEEDLE) ×2 IMPLANT
PENCIL BUTTON HOLSTER BLD 10FT (ELECTRODE) ×2 IMPLANT
POSITIONER SURGICAL ARM (MISCELLANEOUS) ×3 IMPLANT
POUCH ENDO CATCH II 15MM (MISCELLANEOUS) ×2 IMPLANT
RELOAD WH ECHELON 45 (STAPLE) ×8 IMPLANT
RELOAD WHITE ECR60W (STAPLE) ×4 IMPLANT
SET TUBE IRRIG SUCTION NO TIP (IRRIGATION / IRRIGATOR) IMPLANT
SOLUTION ANTI FOG 6CC (MISCELLANEOUS) ×2 IMPLANT
SOLUTION ELECTROLUBE (MISCELLANEOUS) ×2 IMPLANT
SPONGE LAP 18X18 X RAY DECT (DISPOSABLE) ×1 IMPLANT
SPONGE LAP 4X18 X RAY DECT (DISPOSABLE) ×2 IMPLANT
STAPLE ECHEON FLEX 60 POW ENDO (STAPLE) ×1 IMPLANT
SURGIFLO W/THROMBIN 8M KIT (HEMOSTASIS) IMPLANT
SUT ETHILON 3 0 PS 1 (SUTURE) ×2 IMPLANT
SUT MNCRL AB 4-0 PS2 18 (SUTURE) ×4 IMPLANT
SUT PDS AB 1 TP1 54 (SUTURE) ×4 IMPLANT
SUT PROLENE 3 0 SH1 36 (SUTURE) ×1 IMPLANT
SUT VICRYL 0 UR6 27IN ABS (SUTURE) ×4 IMPLANT
SYR BULB IRRIGATION 50ML (SYRINGE) IMPLANT
TOWEL OR NON WOVEN STRL DISP B (DISPOSABLE) ×4 IMPLANT
TRAY FOLEY CATH 14FRSI W/METER (CATHETERS) ×2 IMPLANT
TRAY LAP CHOLE (CUSTOM PROCEDURE TRAY) ×2 IMPLANT
TROCAR XCEL 12X100 BLDLESS (ENDOMECHANICALS) ×3 IMPLANT
TUBING INSUFFLATION 10FT LAP (TUBING) ×2 IMPLANT
WATER STERILE IRR 1500ML POUR (IV SOLUTION) ×4 IMPLANT

## 2013-09-15 NOTE — Brief Op Note (Signed)
09/15/2013  3:21 PM  PATIENT:  Kaylee [name]  72 y.o. female  PRE-OPERATIVE DIAGNOSIS:  LARGE LEFT RENAL MASS  POST-OPERATIVE DIAGNOSIS:  LARGE LEFT RENAL MASS  PROCEDURE:  Procedure(s): ROBOTIC ASSISTED LAPAROSCOPIC NEPHRECTOMY (Left)  SURGEON:  Surgeon(s) and Role:    * Sebastian Ache, MD - Primary  PHYSICIAN ASSISTANT:   ASSISTANTS: Lujean Rave, PA   ANESTHESIA:   local and spinal  EBL:  Total I/O In: 2000 [I.V.:2000] Out: 600 [Urine:250; Blood:350]  BLOOD ADMINISTERED:none  DRAINS: none   LOCAL MEDICATIONS USED:  MARCAINE     SPECIMEN:  Source of Specimen:  Left Radical Nephrectomy  DISPOSITION OF SPECIMEN:  PATHOLOGY  COUNTS:  YES  TOURNIQUET:  * No tourniquets in log *  DICTATION: .Other Dictation: Dictation Number [id number]  PLAN OF CARE: Admit to inpatient   PATIENT DISPOSITION:  PACU - hemodynamically stable.   Delay start of Pharmacological VTE agent (>24hrs) due to surgical blood loss or risk of bleeding: yes

## 2013-09-15 NOTE — Anesthesia Preprocedure Evaluation (Addendum)
Anesthesia Evaluation  Patient identified by MRN, date of birth, ID band Patient awake    Reviewed: Allergy & Precautions, H&P , NPO status , Patient's Chart, lab work & pertinent test results  Airway Mallampati: II TM Distance: >3 FB Neck ROM: Full    Dental  (+) Partial Upper   Pulmonary neg pulmonary ROS, COPDCurrent Smoker,  Prior right upper lobe resection for lung CA in past. breath sounds clear to auscultation  + decreased breath sounds      Cardiovascular negative cardio ROS  Rhythm:Regular Rate:Normal     Neuro/Psych  Headaches, Anxiety Depression negative neurological ROS  negative psych ROS   GI/Hepatic Neg liver ROS, GERD-  Medicated,  Endo/Other  diabetes, Type 2  Renal/GU negative Renal ROS  negative genitourinary   Musculoskeletal negative musculoskeletal ROS (+)   Abdominal   Peds  Hematology negative hematology ROS (+)   Anesthesia Other Findings   Reproductive/Obstetrics                          Anesthesia Physical Anesthesia Plan  ASA: III  Anesthesia Plan: General   Post-op Pain Management:    Induction: Intravenous  Airway Management Planned: Oral ETT  Additional Equipment:   Intra-op Plan:   Post-operative Plan: Extubation in OR  Informed Consent: I have reviewed the patients History and Physical, chart, labs and discussed the procedure including the risks, benefits and alternatives for the proposed anesthesia with the patient or authorized representative who has indicated his/her understanding and acceptance.   Dental advisory given  Plan Discussed with: CRNA  Anesthesia Plan Comments:         Anesthesia Quick Evaluation

## 2013-09-15 NOTE — Transfer of Care (Signed)
Immediate Anesthesia Transfer of Care Note  Patient: Kaylee [name]  Procedure(s) Performed: Procedure(s): ROBOTIC ASSISTED LAPAROSCOPIC NEPHRECTOMY (Left)  Patient Location: PACU  Anesthesia Type:General  Level of Consciousness: Patient easily awoken, sedated, comfortable, cooperative, following commands, responds to stimulation.   Airway & Oxygen Therapy: Patient spontaneously breathing, ventilating well, oxygen via simple oxygen mask.  Post-op Assessment: Report given to PACU RN, vital signs reviewed and stable, moving all extremities.   Post vital signs: Reviewed and stable.  Complications: No apparent anesthesia complications

## 2013-09-15 NOTE — H&P (Signed)
Kaylee [name] is an 72 y.o. female.    Chief Complaint: PRe-OP Left Robotic Radical Nephrectomy  HPI:   1 - Left Renal Mass with Renal Vein Involvement, Likely Metastatic - pt with left 5.4cm enhancing renal mass with tumor thrombus in vein to level of mid aorta / SMA. 1 artery, 1 vein renovascular anatomy. No contralateral lesions. Dedicated abd MRI as well AS PET-CT favor possible small hepatic met and possible <1cm scattered pulmonary mets.  PMH sig for Type II DM, COPD (No O2, not limiting), left femoral hernia (observation), lung cancer treated with partial pnumonectomy in 1980s.     Today Kaylee [name] is seen to proceed with left radical nephrectomy, cytoreductive.   Past Medical History  Diagnosis Date  . Depression   . Back pain   . Cancer   . COPD (chronic obstructive pulmonary disease)   . Headache(784.0)     HX MIGRAINES   . GERD (gastroesophageal reflux disease)   . Left renal mass   . Frequency of urination   . Urgency of urination   . Nocturia   . Hernia     L LOWER ABD  . Difficulty sleeping     Past Surgical History  Procedure Laterality Date  . Lung surgery  1985    RT UPPER LOBE REMOVED FOR CANCER - NOT FRUTHER TX  . Breast surgery  1988    AUGMENTATION  . Back surgery  1985  . Abdominal hysterectomy  1970  . Hand surgery      LEFT    Family History  Problem Relation Age of Onset  . Cancer Mother   . Heart disease Father    Social History:  reports that she has been smoking Cigarettes.  She has been smoking about 1.00 pack per day. She has never used smokeless tobacco. She reports that she does not drink alcohol or use illicit drugs.  Allergies:  Allergies  Allergen Reactions  . Penicillins     unknown  . Codeine Swelling and Rash    No prescriptions prior to admission    No results found for this or any previous visit (from the past 48 hour(s)). No results found.  Review of Systems  Constitutional: Negative.  Negative for fever  and chills.  HENT: Negative.   Eyes: Negative.   Cardiovascular: Negative.   Gastrointestinal: Negative.   Genitourinary: Negative.   Musculoskeletal: Negative.   Skin: Negative.   Neurological: Negative.   Endo/Heme/Allergies: Negative.   Psychiatric/Behavioral: Negative.     There were no vitals taken for this visit. Physical Exam  Constitutional: She is oriented to person, place, and time. She appears well-developed and well-nourished.  HENT:  Head: Normocephalic.  Eyes: Pupils are equal, round, and reactive to light.  Neck: Normal range of motion. Neck supple.  Cardiovascular: Normal rate.   Respiratory: Effort normal. She has wheezes.  Baseline very mild wheezes c/w chronic COPD  GI: Soft. Bowel sounds are normal.  Genitourinary:  No CVAT  Musculoskeletal: Normal range of motion.  Neurological: She is alert and oriented to person, place, and time.  Skin: Skin is warm and dry.  Psychiatric: She has a normal mood and affect. Her behavior is normal. Judgment and thought content normal.     Assessment/Plan  1 - Left Renal Mass with Renal Vein Involvement, Likely Metastatic -  Pt overall very anxious as has multiple family members with advanced cancer in past and adamantly wants surgical extirpation first. I again clearly stated goals would be  palliatiean and prolonged survival, but that complete cure likley not possible with nephrectomy alone. She has very good understanding of this as well as the high-risk nature of such surgery given her extensive COPD.   We rediscussed the role of radical nephrectomy with the overall goal of complete surgical excision (negative margins) and better staging / diagnosis. We specifically discussed that with removal of the kidney there would be an overall renal function decline with attendant risks of renal failure and need for dialysis in some cases, and need for kidney-friendly lifestyle post-op with excellent blood pressure and glycemic control.  We then rediscussed surgical approaches including robotic and open techniques with robotic associated with a shorter convalescence. I showed the patient on their abdomen the approximately 4-6 incision (trocar) sites as well as presumed extraction sites with robotic approach as well as possible open incision sites. We specifically addressed that there may be need to alter operative plans according to intraopertive findings including conversion to open procedure. We rediscussed specific peri-operative risks including bleeding, infection, deep vein thrombosis, pulmonary embolism, compartment syndrome, nuropathy / neuropraxia, heart attack, stroke, death, as well as long-term risks such as non-cure / need for additional therapy and need for imaging and lab based post-op surveillance protocols. Were discussed typical hospital course of approximately 2 day hospitalization, need for peri-operative drains / catheters, and typical post-hospital course with return to most non-strenuous activities by 2 weeks and ability to return to most jobs and more strenuous activity such as exercise by 6 weeks.   Tumor thrombus does not pass aorta and there appears to be acceptable window to artery from inferior approach and adequate vein stump for margin and hemostasis with attempt robotic approach. I did tell her there would be at lest 1/10 conversion to open chance.  Kaylee [name] 09/15/2013, 6:17 AM

## 2013-09-15 NOTE — Anesthesia Postprocedure Evaluation (Signed)
Anesthesia Post Note  Patient: Kaylee [name]  Procedure(s) Performed: Procedure(s) (LRB): ROBOTIC ASSISTED LAPAROSCOPIC NEPHRECTOMY (Left)  Anesthesia type: General  Patient location: PACU  Post pain: Pain level controlled  Post assessment: Post-op Vital signs reviewed  Last Vitals:  Filed Vitals:   09/15/13 1530  BP: 130/54  Pulse: 99  Temp: 36.4 C  Resp: 15    Post vital signs: Reviewed  Level of consciousness: sedated  Complications: No apparent anesthesia complications

## 2013-09-15 NOTE — Preoperative (Signed)
Beta Blockers   Reason not to administer Beta Blockers:Not Applicable 

## 2013-09-16 ENCOUNTER — Encounter (HOSPITAL_COMMUNITY): Payer: Self-pay | Admitting: Urology

## 2013-09-16 LAB — BASIC METABOLIC PANEL
CO2: 23 mEq/L (ref 19–32)
Chloride: 101 mEq/L (ref 96–112)
Creatinine, Ser: 1.17 mg/dL — ABNORMAL HIGH (ref 0.50–1.10)
GFR calc non Af Amer: 45 mL/min — ABNORMAL LOW (ref >90)
Glucose, Bld: 161 mg/dL — ABNORMAL HIGH (ref 70–99)
Potassium: 4.3 mEq/L (ref 3.5–5.1)
Sodium: 132 mEq/L — ABNORMAL LOW (ref 135–145)

## 2013-09-16 MED ORDER — CALCIUM CARBONATE ANTACID 500 MG PO CHEW
1.0000 | CHEWABLE_TABLET | Freq: Three times a day (TID) | ORAL | Status: DC
  Administered 2013-09-16 – 2013-09-17 (×3): 200 mg via ORAL
  Filled 2013-09-16 (×4): qty 1

## 2013-09-16 MED ORDER — SODIUM CHLORIDE 0.9 % IV BOLUS (SEPSIS)
500.0000 mL | Freq: Once | INTRAVENOUS | Status: AC
  Administered 2013-09-16: 03:00:00 500 mL via INTRAVENOUS

## 2013-09-16 NOTE — Progress Notes (Signed)
Patient's vital signs were taken and BP was 84/55. Urine output was low. 125cc in 7 hrs. Bladder scanner was showing 0cc. PA oncall was notified and new orders were given for a 500cc bolus and to irrigate the foley. Foley was irrigated and bolus was given. Will continue to monitor patient.

## 2013-09-16 NOTE — Op Note (Signed)
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  PROCEDURE:  Robotic assisted laparoscopic left radical nephrectomy.  ASSISTANT:  Pecola Leisure, PA  ESTIMATED BLOOD LOSS:  300 mL.  TRANSFUSIONS:  None.  SPECIMEN:  Left radical nephrectomy.  FINDINGS: 1. Two artery, one vein dominant left renovascular anatomy. 2. 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  INDICATION:  Kaylee [name] is a pleasant 72 year old lady with unfortunate history of advanced COPD and prior lung cancer.  She has been without disease for nearly 30 years.  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 She had several findings worrisome for metastatic disease including several small liver and lung nodules.  Options were discussed including palliative approach versus medical therapy only versus maximal therapy beginning with cytoreductive nephrectomy and she wished to proceed with the latter.  Informed consent was obtained and placed in medical record.  PROCEDURE IN DETAIL:  The patient being, Kaylee [name], verified. Procedure being left robotic radical nephrectomy was confirmed. Procedure was carried out.  Time-out was performed.  Intravenous antibiotics were administered.  General endotracheal anesthesia was introduced.  The patient placed into a left side up flank position applying 15 degrees of table flexion, superior arm elevator, and bean bag, axillary roll, and sequential  compression devices.  She was further fashioned on the operative table using 3-inch tape over a foam padding. Next, a sterile field was created by prepping and draping the patient's entire left flank and abdomen from the xiphoid to the pubis.  Given chlorhexidine gluconate after Foley catheter was placed.  A high-flow low-pressure pneumoperitoneum was easily obtained using Veress technique in the left lower quadrant having passed the aspiration and drop test. Next, a 12 mm robotic camera port was placed in position approximately 3 fingerbreadths superolateral to the umbilicus occurring on the left side.  Laparoscopic examination of the peritoneal cavity revealed no significant adhesions and no visceral injury.  Additional ports were then placed as follows; left subcostal 8-mm robotic port, a left paramedian inferior 8-mm robotic port, left far lateral 8-mm robotic port approximately 4 fingerbreadths superomedial to the anterior iliac spine.  A 12 mm cyst port in the midline, 3 fingerbreadths above the camera port and 12 mm distal port in the midline 3 fingerbreadths below the camera port.  Robot was docked and passed through electronic checks. Initial attention was directed at development of retroperitoneum. Incision was made lateral to the descending colon from the area of the splenic flexure towards the area of the internal ring.  The colon was carefully swept medially over the area of the kidney.  There were significant desmoplastic reaction making the colonic mesentery densely adherent to [name]  fascia.  This was very carefully dissected away. The lower pole of the kidney was identified and placed on gentle lateral traction.  Dissection were proceeded medial to this towards the psoas muscle.  The left ureter and gonadal vein were encountered and placed on gentle lateral traction.  Dissection proceeded within this triangle superiorly between the psoas muscle and ureter.  There were  numerous parasitic vessels between the medial aspect of the kidney and the aorta that were carefully controlled using coagulation current versus cold clipping.  A larger inferior pole and artery was controlled using endovascular stapler, but this did not provide adequate hemostatic control as such it was oversewn using 3-0 Prolene, which provided excellent hemostasis at this site.  The dominant hilum consisted of single artery and single vein with numerous small parasitic vessels around this.  The renal vein had a visible large tumor thrombus as expected and it grossly appeared to be mobile within the renal vein and not crossing the midline.  The dominant renal artery was then circumferentially mobilized and controlled using a vascular load stapler.  Next, the left renal vein with tumor thrombus was very carefully controlled using endovascular stapler milking the tumor grossly to this side with the specimen, and this was achieved grossly and the adrenal gland was then kept with the kidney and freed up from superior and lateral attachments as was the upper pole of the kidney and lateral attachments to the abdominal wall.  Lastly, the ureter was controlled using double clip ligation and the left gonadal vessels controlled using endovascular load stapler.  This completely freed up the left nephrectomy specimen, which was placed an extra large endoscopic retrieval bag.  Final inspection of the nephrectomy bed revealed excellent hemostasis.  There was no visceral injury.  The robot was undocked.  Specimen was retrieved by extending the inferior most assistant port site for distance of approximately 7 cm and removing the specimen setting aside for permanent pathology.  All 12 mm port sites were closed at the level of the fascia using figure-of-eight 0 Vicryl. Retrieval site was closed with at the level of the fascia using figure- of-eight PDS x6 and then reapproximated with Scarpa's using  running 0 Vicryl.  All incision sites were infiltrated with dilute Marcaine and then reapproximated the skin using subcuticular Monocryl followed by Dermabond.  Procedure was then terminated.  The patient tolerated the procedure well.  There were no immediate periprocedural complications. The patient was taken to postanesthesia care unit in stable condition.          ______________________________ Sebastian Ache, MD     TM/MEDQ  D:  09/15/2013  T:  09/16/2013  Job:  [id number]

## 2013-09-16 NOTE — Progress Notes (Signed)
Pt's foley removed at 0900. Pt had not voided since foley removal despite multiple tries to void. Pt was bladder scanned; of urine found in bladder. Dr. Berneice Heinrich made aware. V.O. Taken to do an I/O cath x1. Pt cathed, of urine relieved. Will continue to monitor pt.

## 2013-09-16 NOTE — Progress Notes (Signed)
RN has been watching patient closely since 0300.After bolus, pressure went up and since 0300, urine output has been 125cc. Then this morning, BP was 84/55. Patient asymptomatic. On call PA was notified. PA stated to watch patient closely. Will continue to monitor.

## 2013-09-16 NOTE — Progress Notes (Signed)
1 Day Post-Op  Subjective:  1 - Left Renal Mass with Renal Vein Involvement, Likely Metastatic - s/p robotic left radical nephrectomy 09/15/2013 for likely metastatic left renal cell carcinoma.   Today Kaylee [name] is doing well. Hgb stable. Not ambulated yet.    Objective: Vital signs in last 24 hours: Temp:  [97.5 F (36.4 C)-98.6 F (37 C)] 98.3 F (36.8 C) (11/25 0550) Pulse Rate:  [71-99] 86 (11/25 0550) Resp:  [8-20] 14 (11/25 0550) BP: (84-130)/(40-66) 85/43 mmHg (11/25 0724) SpO2:  [93 %-100 %] 96 % (11/25 0550) Weight:  [62.3 kg (137 lb 5.6 oz)] 62.3 kg (137 lb 5.6 oz) (11/24 1712) Last BM Date: 09/14/13  Intake/Output from previous day: 11/24 0701 - 11/25 0700 In: 4686.3 [P.O.:540; I.V.:4146.3] Out: 1180 [Urine:830; Blood:350] Intake/Output this shift:    General appearance: alert, cooperative, appears stated age and family at bedside Head: Normocephalic, without obvious abnormality, atraumatic Eyes: conjunctivae/corneas clear. PERRL, EOM's intact. Fundi benign. Ears: normal TM's and external ear canals both ears Nose: Nares normal. Septum midline. Mucosa normal. No drainage or sinus tenderness. Throat: lips, mucosa, and tongue normal; teeth and gums normal Neck: no adenopathy, no carotid bruit, no JVD, supple, symmetrical, trachea midline and thyroid not enlarged, symmetric, no tenderness/mass/nodules Back: symmetric, no curvature. ROM normal. No CVA tenderness. Resp: clear to auscultation bilaterally Chest wall: no tenderness Cardio: regular rate and rhythm, S1, S2 normal, no murmur, click, rub or gallop GI: soft, non-tender; bowel sounds normal; no masses,  no organomegaly Pelvic: external genitalia normal and foley c/d/i with clear yellow urine Extremities: extremities normal, atraumatic, no cyanosis or edema Pulses: 2+ and symmetric Skin: Skin color, texture, turgor normal. No rashes or lesions Lymph nodes: Cervical, supraclavicular, and axillary nodes  normal. Neurologic: Grossly normal Incision/Wound: Recent port sites and extraction sites c/d/i.   Lab Results:   Recent Labs  09/15/13 1553 09/16/13 0345  HGB 8.7* 8.7*  HCT 27.6* 27.2*   BMET  Recent Labs  09/15/13 1553 09/16/13 0345  NA 132* 132*  K 4.6 4.3  CL 100 101  CO2 24 23  GLUCOSE 244* 161*  BUN 15 16  CREATININE 0.78 1.17*  CALCIUM 8.6 7.7*   PT/INR No results found for this basename: LABPROT, INR,  in the last 72 hours ABG No results found for this basename: PHART, PCO2, PO2, HCO3,  in the last 72 hours  Studies/Results: No results found.  Anti-infectives: Anti-infectives   Start     Dose/Rate Route Frequency Ordered Stop   09/15/13 1100  ciprofloxacin (CIPRO) IVPB 400 mg  Status:  Discontinued    Comments:  Preop.   400 mg 200 mL/hr over 60 Minutes Intravenous Every 12 hours 09/15/13 0957 09/15/13 1709   09/15/13 1100  clindamycin (CLEOCIN) IVPB 900 mg    Comments:  Preop.   900 mg 100 mL/hr over 30 Minutes Intravenous  Once 09/15/13 0957 09/15/13 1137      Assessment/Plan:  1 - Left Renal Mass with Renal Vein Involvement, Likely Metastatic - DC foley, ambulate. Reinforced need to use IS and wean O2 given her baseline pulmonary disease. Discussed goals for discharge.   Cr and Hgb acceptable. Low BP is her baseline and non-worrisome with stable Hgb and low HR.   [facility], Kaylee [name] 09/16/2013

## 2013-09-16 NOTE — Care Management Note (Signed)
    Page 1 of 1   09/16/2013     11:55:09 AM   CARE MANAGEMENT NOTE 09/16/2013  Patient:  Kaylee Kaylee [name]   Account Number:  [id number]  Date Initiated:  09/16/2013  Documentation initiated by:  Lanier Clam  Subjective/Objective Assessment:   72 Y/O F ADMITTED W/L RENAL MASS.     Action/Plan:   FROM HOME ALONE.HAS PCP,PHARMACY.   Anticipated DC Date:  09/17/2013   Anticipated DC Plan:  HOME/SELF CARE      DC Planning Services  CM consult      Choice offered to / List presented to:             Status of service:  In process, will continue to follow Medicare Important Message given?   (If response is "NO", the following Medicare IM given date fields will be blank) Date Medicare IM given:   Date Additional Medicare IM given:    Discharge Disposition:    Per UR Regulation:  Reviewed for med. necessity/level of care/duration of stay  If discussed at Long Length of Stay Meetings, dates discussed:    Comments:  09/16/13 Kaylee Hazelip RN,BSN NCM [phone] POD#1 L RAD NEPHRECTOMY.BP/HR ISSUES.NO ANTICIPATED D/C NEEDS.

## 2013-09-17 LAB — BASIC METABOLIC PANEL
CO2: 23 mEq/L (ref 19–32)
Calcium: 8.5 mg/dL (ref 8.4–10.5)
Chloride: 94 mEq/L — ABNORMAL LOW (ref 96–112)
Creatinine, Ser: 1.04 mg/dL (ref 0.50–1.10)
Sodium: 125 mEq/L — ABNORMAL LOW (ref 135–145)

## 2013-09-17 LAB — HEMOGLOBIN AND HEMATOCRIT, BLOOD: HCT: 26.9 % — ABNORMAL LOW (ref 36.0–46.0)

## 2013-09-17 MED ORDER — OXYCODONE-ACETAMINOPHEN 5-325 MG PO TABS: 1.0000 | ORAL_TABLET | ORAL | Status: DC | PRN

## 2013-09-17 MED ORDER — SENNOSIDES-DOCUSATE SODIUM 8.6-50 MG PO TABS: 1.0000 | ORAL_TABLET | Freq: Two times a day (BID) | ORAL | Status: DC

## 2013-09-17 NOTE — Progress Notes (Signed)
2 Days Post-Op  Subjective:  1 - Left Renal Mass with Renal Vein Involvement, Likely Metastatic - s/p robotic left radical nephrectomy 09/15/2013 for probable metastatic left renal cell carcinoma.   Today Rielynn is doing well. Tolerating diet w/o emesis, ambulatory. Off O2. Had initial trouble voiding after DC foley, but now voided x several w/o sensation of PVR.  Objective: Vital signs in last 24 hours: Temp:  [98.1 F (36.7 C)-98.7 F (37.1 C)] 98.1 F (36.7 C) (11/26 0548) Pulse Rate:  [85-103] 103 (11/26 0548) Resp:  [16-18] 18 (11/26 0548) BP: (85-113)/(43-55) 102/53 mmHg (11/26 0548) SpO2:  [93 %-98 %] 95 % (11/26 0548) Last BM Date: 09/14/13  Intake/Output from previous day: 11/25 0701 - 11/26 0700 In: 1260 [P.O.:360; I.V.:900] Out: 1125 [Urine:1125] Intake/Output this shift: Total I/O In: -  Out: 475 [Urine:475]  General appearance: alert, cooperative, appears stated age and sister at bedside Head: Normocephalic, without obvious abnormality, atraumatic Eyes: conjunctivae/corneas clear. PERRL, EOM's intact. Fundi benign. Ears: normal TM's and external ear canals both ears Nose: Nares normal. Septum midline. Mucosa normal. No drainage or sinus tenderness. Throat: lips, mucosa, and tongue normal; teeth and gums normal Neck: no adenopathy, no carotid bruit, no JVD, supple, symmetrical, trachea midline and thyroid not enlarged, symmetric, no tenderness/mass/nodules Back: symmetric, no curvature. ROM normal. No CVA tenderness. Resp: clear to auscultation bilaterally Chest wall: no tenderness Cardio: mild tachycardia, nl rythem GI: soft, non-tender; bowel sounds normal; no masses,  no organomegaly Extremities: extremities normal, atraumatic, no cyanosis or edema Pulses: 2+ and symmetric Skin: Skin color, texture, turgor normal. No rashes or lesions Lymph nodes: Cervical, supraclavicular, and axillary nodes normal. Neurologic: Grossly normal Incision/Wound: Recent port  sites and extraction sites c/d/i.   Lab Results:   Recent Labs  09/15/13 1553 09/16/13 0345  HGB 8.7* 8.7*  HCT 27.6* 27.2*   BMET  Recent Labs  09/15/13 1553 09/16/13 0345  NA 132* 132*  K 4.6 4.3  CL 100 101  CO2 24 23  GLUCOSE 244* 161*  BUN 15 16  CREATININE 0.78 1.17*  CALCIUM 8.6 7.7*   PT/INR No results found for this basename: LABPROT, INR,  in the last 72 hours ABG No results found for this basename: PHART, PCO2, PO2, HCO3,  in the last 72 hours  Studies/Results: No results found.  Anti-infectives: Anti-infectives   Start     Dose/Rate Route Frequency Ordered Stop   09/15/13 1100  ciprofloxacin (CIPRO) IVPB 400 mg  Status:  Discontinued    Comments:  Preop.   400 mg 200 mL/hr over 60 Minutes Intravenous Every 12 hours 09/15/13 0957 09/15/13 1709   09/15/13 1100  clindamycin (CLEOCIN) IVPB 900 mg    Comments:  Preop.   900 mg 100 mL/hr over 30 Minutes Intravenous  Once 09/15/13 0957 09/15/13 1137      Assessment/Plan:  1 - Left Renal Mass with Renal Vein Involvement, Likely Metastatic - saline lock IV, regular diet. Recheck BMP, Hgb this AM to verify approx stable given mild tachycardia.  If has good day perhaps DC in PM today v. Tomorrow.  [facility], Havoc Sanluis 09/17/2013

## 2013-09-17 NOTE — Discharge Summary (Signed)
Physician Discharge Summary  Patient ID: Kaylee [name] MRN: [id number] DOB/AGE: [birth year] 74 y.o.  Admit date: 09/15/2013 Discharge date: 09/17/2013  Admission Diagnoses: Left Renal Mass with Renal Vein Involvement, Likely Metastatic  Discharge Diagnoses: Stage 3 Left Renal Cell Carcinoma s/p Nephrectomy   Discharged Condition: good  Hospital Course:   1 - Left Renal Mass with Renal Vein Involvement, Likely Metastatic - s/p robotic left radical nephrectomy 09/15/2013 for probable metastatic left renal cell carcinoma. By POD 2 she had stable hemoglobin, preserved renal funciton with Cr <1, tolerating regular diet, pain controlled, ambulatory, and felt to be adequate for discharge.  Consults: None  Significant Diagnostic Studies: labs: Cr <1. Hgb 8.7 at discharge. Path pT3a renal cell carcinoma.   Treatments: surgery: robotic left radical nephrectomy 09/15/2013   Discharge Exam: Blood pressure 146/18, pulse 91, temperature 98.3 F (36.8 C), temperature source Oral, resp. rate 18, height 5\' 7"  (1.702 m), weight 62.3 kg (137 lb 5.6 oz), SpO2 93.00%. General appearance: alert, cooperative, appears stated age and family at bedside Head: Normocephalic, without obvious abnormality, atraumatic Eyes: conjunctivae/corneas clear. PERRL, EOM's intact. Fundi benign. Ears: normal TM's and external ear canals both ears Nose: Nares normal. Septum midline. Mucosa normal. No drainage or sinus tenderness. Throat: lips, mucosa, and tongue normal; teeth and gums normal Neck: no adenopathy, no carotid bruit, no JVD, supple, symmetrical, trachea midline and thyroid not enlarged, symmetric, no tenderness/mass/nodules Back: symmetric, no curvature. ROM normal. No CVA tenderness. Resp: clear to auscultation bilaterally Chest wall: no tenderness Cardio: regular rate and rhythm, S1, S2 normal, no murmur, click, rub or gallop GI: soft, non-tender; bowel sounds normal; no masses,  no  organomegaly Pelvic: external genitalia normal Extremities: extremities normal, atraumatic, no cyanosis or edema Pulses: 2+ and symmetric Skin: Skin color, texture, turgor normal. No rashes or lesions Lymph nodes: Cervical, supraclavicular, and axillary nodes normal. Neurologic: Grossly normal Incision/Wound: Recent port sites and extraction sites c/d/i w/o erythema.  Disposition: Final discharge disposition not confirmed     Medication List    TAKE these medications       ALPRAZolam 0.25 MG tablet  Commonly known as:  XANAX  Take 0.25 mg by mouth.     omeprazole 40 MG capsule  Commonly known as:  PRILOSEC  Take 40 mg by mouth daily.     PARoxetine 20 MG tablet  Commonly known as:  PAXIL  Take 20 mg by mouth every evening.      ASK your doctor about these medications       oxyCODONE-acetaminophen 5-325 MG per tablet  Commonly known as:  PERCOCET/ROXICET  Take 1 tablet by mouth every 4 (four) hours as needed for severe pain.     traMADol 50 MG tablet  Commonly known as:  ULTRAM  Take by mouth every 6 (six) hours as needed.           Follow-up Information   Follow up with Sebastian Ache, MD. (We will call you in with f/u appt in about 2 weeks)    Specialty:  Urology   Contact information:   [address] [place] [address] [phone]       Signed: Sebastian Ache 09/17/2013, 5:12 PM

## 2013-09-19 LAB — TYPE AND SCREEN
ABO/RH(D): O POS
Unit division: 0

## 2013-09-27 ENCOUNTER — Encounter (HOSPITAL_COMMUNITY): Payer: Self-pay | Admitting: Emergency Medicine

## 2013-09-27 ENCOUNTER — Emergency Department (HOSPITAL_COMMUNITY): Payer: Medicare HMO

## 2013-09-27 ENCOUNTER — Inpatient Hospital Stay (HOSPITAL_COMMUNITY)
Admission: EM | Admit: 2013-09-27 | Discharge: 2013-10-01 | DRG: 686 | Disposition: A | Discharge status: Home / Self Care | Payer: Medicare HMO | Attending: Family Medicine | Admitting: Family Medicine

## 2013-09-27 DIAGNOSIS — C7800 Secondary malignant neoplasm of unspecified lung: Secondary | ICD-10-CM | POA: Diagnosis present

## 2013-09-27 DIAGNOSIS — R918 Other nonspecific abnormal finding of lung field: Secondary | ICD-10-CM

## 2013-09-27 DIAGNOSIS — D649 Anemia, unspecified: Secondary | ICD-10-CM | POA: Diagnosis present

## 2013-09-27 DIAGNOSIS — C642 Malignant neoplasm of left kidney, except renal pelvis: Secondary | ICD-10-CM

## 2013-09-27 DIAGNOSIS — Z905 Acquired absence of kidney: Secondary | ICD-10-CM

## 2013-09-27 DIAGNOSIS — F3289 Other specified depressive episodes: Secondary | ICD-10-CM | POA: Diagnosis present

## 2013-09-27 DIAGNOSIS — R109 Unspecified abdominal pain: Secondary | ICD-10-CM | POA: Diagnosis present

## 2013-09-27 DIAGNOSIS — F329 Major depressive disorder, single episode, unspecified: Secondary | ICD-10-CM | POA: Diagnosis present

## 2013-09-27 DIAGNOSIS — J4489 Other specified chronic obstructive pulmonary disease: Secondary | ICD-10-CM | POA: Diagnosis present

## 2013-09-27 DIAGNOSIS — Z902 Acquired absence of lung [part of]: Secondary | ICD-10-CM

## 2013-09-27 DIAGNOSIS — J449 Chronic obstructive pulmonary disease, unspecified: Secondary | ICD-10-CM | POA: Diagnosis present

## 2013-09-27 DIAGNOSIS — Z808 Family history of malignant neoplasm of other organs or systems: Secondary | ICD-10-CM

## 2013-09-27 DIAGNOSIS — Z800 Family history of malignant neoplasm of digestive organs: Secondary | ICD-10-CM

## 2013-09-27 DIAGNOSIS — E43 Unspecified severe protein-calorie malnutrition: Secondary | ICD-10-CM | POA: Diagnosis present

## 2013-09-27 DIAGNOSIS — Z880 Allergy status to penicillin: Secondary | ICD-10-CM

## 2013-09-27 DIAGNOSIS — R630 Anorexia: Secondary | ICD-10-CM | POA: Diagnosis present

## 2013-09-27 DIAGNOSIS — K219 Gastro-esophageal reflux disease without esophagitis: Secondary | ICD-10-CM | POA: Diagnosis present

## 2013-09-27 DIAGNOSIS — F17200 Nicotine dependence, unspecified, uncomplicated: Secondary | ICD-10-CM | POA: Diagnosis present

## 2013-09-27 DIAGNOSIS — Z79899 Other long term (current) drug therapy: Secondary | ICD-10-CM

## 2013-09-27 DIAGNOSIS — D638 Anemia in other chronic diseases classified elsewhere: Secondary | ICD-10-CM | POA: Diagnosis present

## 2013-09-27 DIAGNOSIS — R42 Dizziness and giddiness: Secondary | ICD-10-CM | POA: Diagnosis present

## 2013-09-27 DIAGNOSIS — C649 Malignant neoplasm of unspecified kidney, except renal pelvis: Principal | ICD-10-CM | POA: Diagnosis present

## 2013-09-27 DIAGNOSIS — R634 Abnormal weight loss: Secondary | ICD-10-CM

## 2013-09-27 DIAGNOSIS — C787 Secondary malignant neoplasm of liver and intrahepatic bile duct: Secondary | ICD-10-CM | POA: Diagnosis present

## 2013-09-27 DIAGNOSIS — IMO0002 Reserved for concepts with insufficient information to code with codable children: Secondary | ICD-10-CM

## 2013-09-27 DIAGNOSIS — N390 Urinary tract infection, site not specified: Secondary | ICD-10-CM | POA: Diagnosis present

## 2013-09-27 HISTORY — DX: Malignant neoplasm of unspecified kidney, except renal pelvis: C64.9

## 2013-09-27 HISTORY — DX: Malignant neoplasm of unspecified part of unspecified bronchus or lung: C34.90

## 2013-09-27 LAB — GLUCOSE, CAPILLARY: Glucose-Capillary: 108 mg/dL — ABNORMAL HIGH (ref 70–99)

## 2013-09-27 LAB — URINALYSIS, ROUTINE W REFLEX MICROSCOPIC
Bilirubin Urine: NEGATIVE
Hgb urine dipstick: NEGATIVE
Ketones, ur: NEGATIVE mg/dL
Nitrite: POSITIVE — AB
Protein, ur: NEGATIVE mg/dL
Specific Gravity, Urine: 1.012 (ref 1.005–1.030)
Urobilinogen, UA: 0.2 mg/dL (ref 0.0–1.0)
pH: 6.5 (ref 5.0–8.0)

## 2013-09-27 LAB — HEPATIC FUNCTION PANEL
Albumin: 3.1 g/dL — ABNORMAL LOW (ref 3.5–5.2)
Alkaline Phosphatase: 99 U/L (ref 39–117)
Bilirubin, Direct: <0.1 mg/dL (ref 0.0–0.3)
Total Protein: 7.5 g/dL (ref 6.0–8.3)

## 2013-09-27 LAB — POCT I-STAT, CHEM 8
Calcium, Ion: 1.18 mmol/L (ref 1.13–1.30)
Creatinine, Ser: 1.1 mg/dL (ref 0.50–1.10)
Hemoglobin: 10.9 g/dL — ABNORMAL LOW (ref 12.0–15.0)
Potassium: 4.5 mEq/L (ref 3.5–5.1)
Sodium: 137 mEq/L (ref 135–145)
TCO2: 23 mmol/L (ref 0–100)

## 2013-09-27 LAB — URINE MICROSCOPIC-ADD ON

## 2013-09-27 LAB — CBC
HCT: 31.7 % — ABNORMAL LOW (ref 36.0–46.0)
MCHC: 32.2 g/dL (ref 30.0–36.0)
MCV: 80.5 fL (ref 78.0–100.0)
Platelets: 440 10*3/uL — ABNORMAL HIGH (ref 150–400)
RDW: 15.8 % — ABNORMAL HIGH (ref 11.5–15.5)
WBC: 11.6 10*3/uL — ABNORMAL HIGH (ref 4.0–10.5)

## 2013-09-27 LAB — LIPASE, BLOOD: Lipase: 16 U/L (ref 11–59)

## 2013-09-27 LAB — CREATININE, SERUM: GFR calc Af Amer: 67 mL/min — ABNORMAL LOW (ref >90)

## 2013-09-27 MED ORDER — ENOXAPARIN SODIUM 40 MG/0.4ML ~~LOC~~ SOLN
40.0000 mg | SUBCUTANEOUS | Status: DC
  Administered 2013-09-27 – 2013-09-30 (×3): 40 mg via SUBCUTANEOUS
  Filled 2013-09-27 (×5): qty 0.4

## 2013-09-27 MED ORDER — ONDANSETRON HCL 4 MG/2ML IJ SOLN
4.0000 mg | Freq: Once | INTRAMUSCULAR | Status: AC
  Administered 2013-09-27: 4 mg via INTRAVENOUS
  Filled 2013-09-27: qty 2

## 2013-09-27 MED ORDER — ACETAMINOPHEN 650 MG RE SUPP: 650.0000 mg | Freq: Four times a day (QID) | RECTAL | Status: DC | PRN

## 2013-09-27 MED ORDER — CIPROFLOXACIN IN D5W 400 MG/200ML IV SOLN
400.0000 mg | Freq: Two times a day (BID) | INTRAVENOUS | Status: DC
  Administered 2013-09-28 – 2013-09-30 (×5): 400 mg via INTRAVENOUS
  Filled 2013-09-27 (×6): qty 200

## 2013-09-27 MED ORDER — IOHEXOL 300 MG/ML  SOLN
80.0000 mL | Freq: Once | INTRAMUSCULAR | Status: AC | PRN
  Administered 2013-09-27: 80 mL via INTRAVENOUS

## 2013-09-27 MED ORDER — ONDANSETRON HCL 4 MG/2ML IJ SOLN
4.0000 mg | Freq: Four times a day (QID) | INTRAMUSCULAR | Status: DC | PRN
  Administered 2013-09-29: 4 mg via INTRAVENOUS
  Filled 2013-09-27: qty 2

## 2013-09-27 MED ORDER — POLYETHYLENE GLYCOL 3350 17 G PO PACK
17.0000 g | PACK | Freq: Every day | ORAL | Status: DC
  Administered 2013-09-27 – 2013-10-01 (×5): 17 g via ORAL
  Filled 2013-09-27 (×5): qty 1

## 2013-09-27 MED ORDER — PAROXETINE HCL 20 MG PO TABS
20.0000 mg | ORAL_TABLET | Freq: Every evening | ORAL | Status: DC
  Administered 2013-09-27 – 2013-09-30 (×4): 20 mg via ORAL
  Filled 2013-09-27 (×5): qty 1

## 2013-09-27 MED ORDER — ALUM & MAG HYDROXIDE-SIMETH 200-200-20 MG/5ML PO SUSP: 30.0000 mL | Freq: Four times a day (QID) | ORAL | Status: DC | PRN

## 2013-09-27 MED ORDER — SODIUM CHLORIDE 0.9 % IV BOLUS (SEPSIS)
1000.0000 mL | Freq: Once | INTRAVENOUS | Status: AC
  Administered 2013-09-27: 1000 mL via INTRAVENOUS

## 2013-09-27 MED ORDER — SENNOSIDES-DOCUSATE SODIUM 8.6-50 MG PO TABS
1.0000 | ORAL_TABLET | Freq: Two times a day (BID) | ORAL | Status: DC
  Administered 2013-09-27 – 2013-10-01 (×8): 1 via ORAL
  Filled 2013-09-27 (×9): qty 1

## 2013-09-27 MED ORDER — MORPHINE SULFATE 4 MG/ML IJ SOLN
4.0000 mg | Freq: Once | INTRAMUSCULAR | Status: AC
  Administered 2013-09-27: 4 mg via INTRAVENOUS
  Filled 2013-09-27: qty 1

## 2013-09-27 MED ORDER — ALPRAZOLAM 0.25 MG PO TABS
0.2500 mg | ORAL_TABLET | Freq: Two times a day (BID) | ORAL | Status: DC | PRN
  Administered 2013-09-27 – 2013-09-28 (×2): 0.25 mg via ORAL
  Filled 2013-09-27 (×3): qty 1

## 2013-09-27 MED ORDER — PANTOPRAZOLE SODIUM 40 MG PO TBEC
40.0000 mg | DELAYED_RELEASE_TABLET | Freq: Every day | ORAL | Status: DC
  Administered 2013-09-27 – 2013-10-01 (×5): 40 mg via ORAL
  Filled 2013-09-27 (×5): qty 1

## 2013-09-27 MED ORDER — OXYCODONE-ACETAMINOPHEN 5-325 MG PO TABS
1.0000 | ORAL_TABLET | ORAL | Status: DC | PRN
  Administered 2013-09-28 – 2013-10-01 (×3): 1 via ORAL
  Filled 2013-09-27 (×3): qty 1

## 2013-09-27 MED ORDER — MORPHINE SULFATE 4 MG/ML IJ SOLN
4.0000 mg | INTRAMUSCULAR | Status: DC | PRN
  Administered 2013-09-27 – 2013-09-30 (×3): 4 mg via INTRAVENOUS
  Filled 2013-09-27 (×3): qty 1

## 2013-09-27 MED ORDER — SODIUM CHLORIDE 0.9 % IV SOLN
INTRAVENOUS | Status: AC
  Administered 2013-09-27: 19:00:00 via INTRAVENOUS

## 2013-09-27 MED ORDER — ACETAMINOPHEN 325 MG PO TABS: 650.0000 mg | ORAL_TABLET | Freq: Four times a day (QID) | ORAL | Status: DC | PRN

## 2013-09-27 MED ORDER — ONDANSETRON HCL 4 MG PO TABS: 4.0000 mg | ORAL_TABLET | Freq: Four times a day (QID) | ORAL | Status: DC | PRN

## 2013-09-27 MED ORDER — CIPROFLOXACIN IN D5W 400 MG/200ML IV SOLN
400.0000 mg | Freq: Once | INTRAVENOUS | Status: AC
  Administered 2013-09-27: 400 mg via INTRAVENOUS
  Filled 2013-09-27: qty 200

## 2013-09-27 MED ORDER — IOHEXOL 300 MG/ML  SOLN
50.0000 mL | Freq: Once | INTRAMUSCULAR | Status: AC | PRN
  Administered 2013-09-27: 50 mL via ORAL

## 2013-09-27 NOTE — ED Notes (Signed)
[  t had kidney removed on 11/24 and states since then has been in pain all over, states she feels as if she is going to pass out, has not called dr regarding this, has not had post op visit

## 2013-09-27 NOTE — ED Notes (Addendum)
Pt took miralax last night for constipation

## 2013-09-27 NOTE — ED Notes (Signed)
Pt alert and oriented sisters at bedside.

## 2013-09-27 NOTE — ED Notes (Signed)
Admitting md at bedside

## 2013-09-27 NOTE — ED Notes (Signed)
Patient transported to CT 

## 2013-09-27 NOTE — ED Notes (Signed)
Since she has  been home pt states she has felt good some and bad some says her appetite is poor and her last BM was last Monday. Pt states this am she is weak and constipated  And in pain and cant take it any more. Pt states she feels like she is going to pass out even when she is laying down.

## 2013-09-27 NOTE — ED Notes (Signed)
Pt has incision midline abdomen and under left breast

## 2013-09-27 NOTE — ED Notes (Signed)
Pt next follow up appt is dec 16 with dr. Berneice Heinrich.

## 2013-09-27 NOTE — H&P (Addendum)
[facility] History and Physical  Kaylee [name] ZOX:[id number] DOB: 1941/02/05 DOA: 09/27/2013  Referring physician: Dr. Jerelyn Scott PCP: Gwen Pounds, MD   Chief Complaint: "I've felt faint, I can't eat anything, I can't sleep, I constantly urinate".   History of Present Illness: Kaylee [name] is an 72 y.o. 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 Berneice Heinrich who reports that since her surgery, she has had left flank pain, sharp, radiating to groin, pre-syncope, insomnia, anorexia, anxiety and polyuria.  "I'm afraid to stand up because I'm afraid I'll get dizzy".  Says she is dizzy, even when lying down.  Called Dr. Berneice Heinrich and was advised to come to the ER for further evaluation, where she was noted to have progression of her cancer with evidence of enlarging pulmonary and hepatic metastasis when compared to PET scan done 08/06/13 as well as nitrates in her urine.    Review of Systems: Constitutional: No fever, no chills;  Appetite diminished; + weight loss, no weight gain, + fatigue.  HEENT: No blurry vision, no diplopia, no pharyngitis, no dysphagia CV: No chest pain, no palpitations, no PND.  Resp: No SOB, + cough, no pleuritic pain. GI: + nausea, no vomiting, no diarrhea, no melena, no hematochezia, + constipation.  GU: No dysuria, no hematuria, + frequency, no urgency. MSK: no myalgias, no arthralgias.  Neuro:  No headache, no focal neurological deficits, no history of seizures.  Psych: + depression, + anxiety.  Endo: No heat intolerance, no cold intolerance, no polyuria, no polydipsia  Skin: No rashes, no skin lesions.  Heme: No easy bruising.  Travel history:   Past Medical History Past Medical History  Diagnosis Date  . Depression   . Back pain   . Lung cancer     s/p resection  . COPD (chronic obstructive pulmonary disease)   . Headache(784.0)     HX MIGRAINES   .  GERD (gastroesophageal reflux disease)   . Renal cell cancer Left  . Frequency of urination   . Urgency of urination   . Nocturia   . Hernia     L LOWER ABD  . Difficulty sleeping      Past Surgical History Past Surgical History  Procedure Laterality Date  . Lung surgery  1985    RT UPPER LOBE REMOVED FOR CANCER - NOT FRUTHER TX  . Breast surgery  1988    AUGMENTATION  . Back surgery  1985  . Abdominal hysterectomy  1970  . Hand surgery      LEFT  . Robot assisted laparoscopic nephrectomy Left 09/15/2013    Procedure: ROBOTIC ASSISTED LAPAROSCOPIC NEPHRECTOMY;  Surgeon: Sebastian Ache, MD;  Location: [facility];  Service: Urology;  Laterality: Left;     Social History: History   Social History  . Marital Status: Widowed    Spouse Name: N/A    Number of Children: 1  . Years of Education: 12   Occupational History  . Prior Doctor, general practice work    Social History Main Topics  . Smoking status: Current Every Day Smoker -- 1.00 packs/day    Types: Cigarettes  . Smokeless tobacco: Never Used  . Alcohol Use: No  . Drug Use: No  . Sexual Activity: Not on file   Other Topics Concern  . Not on file   Social History Narrative   Widowed since July 2009.  Lives alone.  Ambulates without assistance.  Family History:  Family History  Problem Relation Age of Onset  . Cancer Mother     Melanoma  . Heart disease Father   . Stroke Father   . Cancer Maternal Grandmother     Colon cancer  . Cancer Brother     Colon cancer  . Cancer Sister     Adrenal gland cancer    Allergies: Penicillins and Codeine  Meds: Prior to Admission medications   Medication Sig Start Date End Date Taking? Authorizing Provider  ALPRAZolam (XANAX) 0.25 MG tablet Take 0.25 mg by mouth 2 (two) times daily as needed for anxiety or sleep.    Yes Historical Provider, MD  omeprazole (PRILOSEC) 40 MG capsule Take 40 mg by mouth daily.   Yes Historical Provider, MD  oxyCODONE-acetaminophen  (PERCOCET/ROXICET) 5-325 MG per tablet Take 1 tablet by mouth every 4 (four) hours as needed for severe pain. Postoperatively. 09/17/13  Yes Sebastian Ache, MD  PARoxetine (PAXIL) 20 MG tablet Take 20 mg by mouth every evening.   Yes Historical Provider, MD  polyethylene glycol (MIRALAX / GLYCOLAX) packet Take 17 g by mouth daily.   Yes Historical Provider, MD  senna-docusate (SENOKOT-S) 8.6-50 MG per tablet Take 1 tablet by mouth 2 (two) times daily. While taking pain meds to prevent constipation. 09/17/13  Yes Sebastian Ache, MD    Physical Exam: Filed Vitals:   09/27/13 1432 09/27/13 1500 09/27/13 1545 09/27/13 1630  BP: 129/74 147/69 126/74 145/67  Pulse: 85 82 70 72  Temp:      TempSrc:      Resp: 23 21 14 17   SpO2: 95% 98% 94% 94%     Physical Exam: Blood pressure 145/67, pulse 72, temperature 97.4 F (36.3 C), temperature source Oral, resp. rate 17, SpO2 94.00%. Gen: No acute distress. Head: Normocephalic, atraumatic. Eyes: PERRL, EOMI, sclerae nonicteric. Mouth: Oropharynx clear with fair dentition. Neck: Supple, no thyromegaly, no lymphadenopathy, no jugular venous distention. Chest: Lungs clear to auscultation bilaterally. CV: Heart sounds are regular. No murmurs, rubs, or gallops. Abdomen: Soft, mildly tender throughout, nondistended with normal active bowel sounds. Surgical scars healing well with no evidence of wound infection. Extremities: Extremities are without clubbing, edema, or cyanosis. Skin: Warm and dry. Neuro: Alert and oriented times 3; cranial nerves II through XII grossly intact. Psych: Mood and affect normal.  Labs on Admission:  Basic Metabolic Panel:  Recent Labs Lab 09/27/13 1232  NA 137  K 4.5  CL 103  GLUCOSE 117*  BUN 17  CREATININE 1.10   Liver Function Tests:  Recent Labs Lab 09/27/13 1256  AST 17  ALT 10  ALKPHOS 99  BILITOT 0.3  PROT 7.5  ALBUMIN 3.1*    Recent Labs Lab 09/27/13 1256  LIPASE 16   CBC:  Recent  Labs Lab 09/27/13 1232  HGB 10.9*  HCT 32.0*   CBG:  Recent Labs Lab 09/27/13 1238  GLUCAP 108*   Urinalysis    Component Value Date/Time   COLORURINE YELLOW 09/27/2013 1418   APPEARANCEUR CLEAR 09/27/2013 1418   LABSPEC 1.012 09/27/2013 1418   PHURINE 6.5 09/27/2013 1418   GLUCOSEU NEGATIVE 09/27/2013 1418   HGBUR NEGATIVE 09/27/2013 1418   BILIRUBINUR NEGATIVE 09/27/2013 1418   KETONESUR NEGATIVE 09/27/2013 1418   PROTEINUR NEGATIVE 09/27/2013 1418   UROBILINOGEN 0.2 09/27/2013 1418   NITRITE POSITIVE* 09/27/2013 1418   LEUKOCYTESUR NEGATIVE 09/27/2013 1418     Radiological Exams on Admission: Ct Abdomen Pelvis W Contrast  09/27/2013   CLINICAL  DATA:  Abdominal pain since left nephrectomy on 09/03/2013.  EXAM: CT ABDOMEN AND PELVIS WITH CONTRAST  TECHNIQUE: Multidetector CT imaging of the abdomen and pelvis was performed using the standard protocol following bolus administration of intravenous contrast.  CONTRAST:  50mL OMNIPAQUE IOHEXOL 300 MG/ML SOLN, 80mL OMNIPAQUE IOHEXOL 300 MG/ML SOLN  COMPARISON:  MRI of the abdomen dated 08/14/2013, PET-CT scan dated 08/06/2013, and CT scan abdomen dated 10/30/2010  FINDINGS: Patient has developed much more extensive pulmonary metastatic disease since the prior PET-CT of 08/06/2013. There are numerous bilateral pulmonary nodules at the lung bases. The largest nodule is at the left lung base posteriorly measuring 13 x 17 mm on image number 10 of series 2 signed report in. The largest nodule on the right is 12 x 10 mm on image number 11 of series 4.  The lesion in the anterior aspect left lobe of the liver has enlarged and now measures 27 x 19 mm. There is a new lesion in the dome of the liver measuring 9 mm.  Biliary tree appears normal. There is a 5 mm lymph node adjacent to the gallbladder on image number 31 of series 2.  Spleen, pancreas, right adrenal gland, and right kidney appear normal. The left adrenal gland is not visualized and may have been  removed.  There is minimal postsurgical scar at the site of the previous nephrectomy.  The bowel appears normal.  There is extensive air in the subcutaneous fat superficial to the muscles of the left anterior and lateral abdominal wall. There is no pus collection. This amount of air in the subcutaneous fat is atypical.  There is a tiny amount of air in the bladder.  IMPRESSION: 1. Marked progression of pulmonary metastases and liver metastases since the prior PET-CT scan dated 08/06/2013. 2. Extensive air in the subcutaneous fat superficial to the muscles of the left side of the anterior lateral abdominal wall without fluid collections to suggest an abscess. This amount of air is atypical. This is of unknown etiology. 3. Minimal postsurgical changes at the site of the previous nephrectomy.   Electronically Signed   By: Geanie Cooley M.D.   On: 09/27/2013 14:17    EKG: Independently reviewed. NSR at 74 bpm, RAD.  Assessment/Plan Principal Problem:   Renal cell carcinoma / abdominal pain Patient has evidence of disease progression. Will need oncological input. Treat pain with oxycodone and morphine as needed.  Will notify Dr. Berneice Heinrich of her admission. Active Problems:   Anorexia Likely cancer related or from post-op sequelae.  Dietician consultation.   Depressive disorder, not elsewhere classified Continue Xanax and Paxil.   Urinary tract infection Followup urine cultures. Empiric Cipro.   Dizziness Doubt presyncope given symptoms while lying flat. Check orthostatics.  Code Status: Full. Family Communication: No family at the bedside. Disposition Plan: Home when stable.  Time spent: 1 hour.  RAMA,CHRISTINA [facility] Pager [phone]  If 7PM-7AM, please contact night-coverage [email or web address] Password [facility] 09/27/2013, 5:21 PM

## 2013-09-27 NOTE — ED Provider Notes (Signed)
CSN: [id number]     Arrival date & time 09/27/13  1135 History   First MD Initiated Contact with Patient 09/27/13 1204     Chief Complaint  Patient presents with  . Weakness  . Pain   (Consider location/radiation/quality/duration/timing/severity/associated sxs/prior Treatment) HPI Pt presents with c/o abdominal pain and generalized weakness.  She had nephrectomy due to carcinoma on 11/24- she states that since that time she has been having abdominal pain that is causing her not to sleep at night.  She also states that she feels very weak and has had several episodes of near syncope.  She states " I feel like I'm going to pass out even when I'm lying down".  Denies vertigo.  No fever/chills.  No decreased urine output, she actually feels she has been urinating more frequently.   Has had decreased appetite but states she has been drinking liquids well.  No chest pain.  She is taking oxycodone but states she has not been taking it every 4 hours as directed.  Certain positions and movement make pain worse.  There are no other associated systemic symptoms, there are no other alleviating or modifying factors.   Past Medical History  Diagnosis Date  . Depression   . Back pain   . Lung cancer     s/p resection  . COPD (chronic obstructive pulmonary disease)   . Headache(784.0)     HX MIGRAINES   . GERD (gastroesophageal reflux disease)   . Renal cell cancer Left  . Frequency of urination   . Urgency of urination   . Nocturia   . Hernia     L LOWER ABD  . Difficulty sleeping    Past Surgical History  Procedure Laterality Date  . Lung surgery  1985    RT UPPER LOBE REMOVED FOR CANCER - NOT FRUTHER TX  . Breast surgery  1988    AUGMENTATION  . Back surgery  1985  . Abdominal hysterectomy  1970  . Hand surgery      LEFT  . Robot assisted laparoscopic nephrectomy Left 09/15/2013    Procedure: ROBOTIC ASSISTED LAPAROSCOPIC NEPHRECTOMY;  Surgeon: Sebastian Ache, MD;  Location: [facility];   Service: Urology;  Laterality: Left;   Family History  Problem Relation Age of Onset  . Cancer Mother     Melanoma  . Heart disease Father   . Stroke Father   . Cancer Maternal Grandmother     Colon cancer  . Cancer Brother     Colon cancer  . Cancer Sister     Adrenal gland cancer   History  Substance Use Topics  . Smoking status: Current Every Day Smoker -- 0.50 packs/day for 54 years    Types: Cigarettes  . Smokeless tobacco: Current User  . Alcohol Use: No   OB History   Grav Para Term Preterm Abortions TAB SAB Ect Mult Living                 Review of Systems ROS reviewed and all otherwise negative except for mentioned in HPI  Allergies  Penicillins and Codeine  Home Medications   No current outpatient prescriptions on file. BP 110/64  Pulse 78  Temp(Src) 98 F (36.7 C) (Oral)  Resp 20  Ht 5\' 7"  (1.702 m)  Wt 125 lb (56.7 kg)  BMI 19.57 kg/m2  SpO2 98% Vitals reviewed Physical Exam Physical Examination: General appearance - alert, well appearing, and in no distress Mental status - alert, oriented to person,  place, and time Eyes - no scleral icterus, no conjunctival injection Mouth - mucous membranes moist, pharynx normal without lesions Chest - clear to auscultation, no wheezes, rales or rhonchi, symmetric air entry Heart - normal rate, regular rhythm, normal S1, S2, no murmurs, rubs, clicks or gallops Abdomen - soft, diffuse ttp, incision site in left abdomen c/d/i, nondistended, no masses or organomegaly Extremities - peripheral pulses normal, no pedal edema, no clubbing or cyanosis Skin - normal coloration and turgor, no rashes  ED Course  Procedures (including critical care time) Labs Review Labs Reviewed  GLUCOSE, CAPILLARY - Abnormal; Notable for the following:    Glucose-Capillary 108 (*)    All other components within normal limits  HEPATIC FUNCTION PANEL - Abnormal; Notable for the following:    Albumin 3.1 (*)    All other components  within normal limits  URINALYSIS, ROUTINE W REFLEX MICROSCOPIC - Abnormal; Notable for the following:    Nitrite POSITIVE (*)    All other components within normal limits  URINE MICROSCOPIC-ADD ON - Abnormal; Notable for the following:    Bacteria, UA MANY (*)    All other components within normal limits  CBC - Abnormal; Notable for the following:    WBC 11.6 (*)    Hemoglobin 10.2 (*)    HCT 31.7 (*)    MCH 25.9 (*)    RDW 15.8 (*)    Platelets 440 (*)    All other components within normal limits  CREATININE, SERUM - Abnormal; Notable for the following:    GFR calc non Af Amer 58 (*)    GFR calc Af Amer 67 (*)    All other components within normal limits  POCT I-STAT, CHEM 8 - Abnormal; Notable for the following:    Glucose, Bld 117 (*)    Hemoglobin 10.9 (*)    HCT 32.0 (*)    All other components within normal limits  LIPASE, BLOOD   Imaging Review Ct Abdomen Pelvis W Contrast  09/27/2013   CLINICAL DATA:  Abdominal pain since left nephrectomy on 09/03/2013.  EXAM: CT ABDOMEN AND PELVIS WITH CONTRAST  TECHNIQUE: Multidetector CT imaging of the abdomen and pelvis was performed using the standard protocol following bolus administration of intravenous contrast.  CONTRAST:  50mL OMNIPAQUE IOHEXOL 300 MG/ML SOLN, 80mL OMNIPAQUE IOHEXOL 300 MG/ML SOLN  COMPARISON:  MRI of the abdomen dated 08/14/2013, PET-CT scan dated 08/06/2013, and CT scan abdomen dated 10/30/2010  FINDINGS: Patient has developed much more extensive pulmonary metastatic disease since the prior PET-CT of 08/06/2013. There are numerous bilateral pulmonary nodules at the lung bases. The largest nodule is at the left lung base posteriorly measuring 13 x 17 mm on image number 10 of series 2 signed report in. The largest nodule on the right is 12 x 10 mm on image number 11 of series 4.  The lesion in the anterior aspect left lobe of the liver has enlarged and now measures 27 x 19 mm. There is a new lesion in the dome of the  liver measuring 9 mm.  Biliary tree appears normal. There is a 5 mm lymph node adjacent to the gallbladder on image number 31 of series 2.  Spleen, pancreas, right adrenal gland, and right kidney appear normal. The left adrenal gland is not visualized and may have been removed.  There is minimal postsurgical scar at the site of the previous nephrectomy.  The bowel appears normal.  There is extensive air in the subcutaneous fat superficial to  the muscles of the left anterior and lateral abdominal wall. There is no pus collection. This amount of air in the subcutaneous fat is atypical.  There is a tiny amount of air in the bladder.  IMPRESSION: 1. Marked progression of pulmonary metastases and liver metastases since the prior PET-CT scan dated 08/06/2013. 2. Extensive air in the subcutaneous fat superficial to the muscles of the left side of the anterior lateral abdominal wall without fluid collections to suggest an abscess. This amount of air is atypical. This is of unknown etiology. 3. Minimal postsurgical changes at the site of the previous nephrectomy.   Electronically Signed   By: Geanie Cooley M.D.   On: 09/27/2013 14:17    EKG Interpretation    Date/Time:  Saturday September 27 2013 12:19:40 EST Ventricular Rate:  74 PR Interval:  151 QRS Duration: 91 QT Interval:  394 QTC Calculation: 437 R Axis:   83 Text Interpretation:  Sinus rhythm Borderline right axis deviation No significant change since last tracing Confirmed by Karma Ganja  MD, MARTHA [phone]) on 09/27/2013 2:21:17 PM            MDM   1. Urinary tract infection   2. Abdominal pain   3. Anorexia   4. Dizziness    Pt presenting with c/o abdominal pain, generalized weakness.  She is approx 2 weeks post op from nephrectomy for carcinoma- mets to liver and lungs appear to have increased on CT scan today.  Also found to have UTI which is likely contributing to her symptoms.  PT started on rocephin.  D/w [facility] for [facility].  Discussed  results including progression of mets with patient.     Ethelda Chick, MD 09/28/13 [phone]

## 2013-09-27 NOTE — ED Notes (Signed)
Pt passing gas

## 2013-09-28 DIAGNOSIS — R634 Abnormal weight loss: Secondary | ICD-10-CM

## 2013-09-28 DIAGNOSIS — C7800 Secondary malignant neoplasm of unspecified lung: Secondary | ICD-10-CM | POA: Diagnosis present

## 2013-09-28 DIAGNOSIS — C649 Malignant neoplasm of unspecified kidney, except renal pelvis: Principal | ICD-10-CM

## 2013-09-28 DIAGNOSIS — D649 Anemia, unspecified: Secondary | ICD-10-CM

## 2013-09-28 NOTE — Progress Notes (Addendum)
[facility] PROGRESS NOTE   Kaylee [name] BMW:[id number] DOB: 04-May-1941 DOA: 09/27/2013 PCP: Gwen Pounds, MD  Brief narrative: Kaylee [name] is an 72 y.o. 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 Berneice Heinrich who was admitted on 09/27/2013 with complaints of left flank pain, sharp, radiating to groin, pre-syncope, insomnia, anorexia, anxiety and polyuria. Upon initial evaluation in the ER, the patient was noted to have evidence of enlarging pulmonary and hepatic metastases as well as evidence of a urinary tract infection, as well as extensive air in the subcutaneous fat superficial to the muscles of the left anterior and lateral abdominal wall, but no evidence of abscess or pus.  Assessment/Plan: Principal Problem:  Renal cell carcinoma / abdominal pain  Pain likely from subcutaneous air.  Unfortunately, scans also showed evidence of disease progression. Request oncology evaluation. Discussed case with Dr. Welton Flakes who recommends obtaining a CT guided biopsy of one of the lung lesions.  Treat pain with oxycodone and morphine as needed. Dr. Berneice Heinrich sent an email informing him of her admission.  Active Problems:  Normocytic anemia Likely from recent operative losses, hemoglobin was normal back in 2010.  Anemia of chronic disease may also be contributory. Anorexia  Likely cancer related or from post-op sequelae. Dietician consultation pending.  Depressive disorder, not elsewhere classified  Continue Xanax and Paxil.  Urinary tract infection  Followup urine cultures. Empiric Cipro.  Dizziness  Orthostatics show a drop in her systolic pressure 23 mm/Hg from sitting to standing.  Hydrate.  Code Status: Full.  Family Communication: 3 siblings updated at the bedside.  Disposition Plan: Home when stable.   IV access:  Peripheral IV  Medical Consultants:  Oncology  Other  Consultants:  None.  Anti-infectives:  Cipro 09/27/13--->  HPI/Subjective: Kaylee [name] still is having abdominal discomfort, especially in the left flank area.  No nausea or vomiting.  No chest pain.  Less dizzy.  Objective: Filed Vitals:   09/27/13 1803 09/27/13 1805 09/27/13 2112 09/28/13 0539  BP: 132/83 109/79 93/57 110/64  Pulse: 80 82 99 78  Temp:   97.4 F (36.3 C) 98 F (36.7 C)  TempSrc:   Oral Oral  Resp: 20 20 20 20   Height:      Weight:      SpO2:   94% 98%    Intake/Output Summary (Last 24 hours) at 09/28/13 0813 Last data filed at 09/28/13 0500  Gross per 24 hour  Intake     60 ml  Output    675 ml  Net   -615 ml    Exam: Gen:  NAD Cardiovascular:  RRR, No M/R/G Respiratory:  Lungs CTAB Gastrointestinal:  Abdomen soft, tender diffusely, + BS Extremities:  No C/E/C  Data Reviewed: Basic Metabolic Panel:  Recent Labs Lab 09/27/13 1232 09/27/13 1254  NA 137  --   K 4.5  --   CL 103  --   GLUCOSE 117*  --   BUN 17  --   CREATININE 1.10 0.95   GFR Estimated Creatinine Clearance: 46.5 ml/min (by C-G formula based on Cr of 0.95). Liver Function Tests:  Recent Labs Lab 09/27/13 1256  AST 17  ALT 10  ALKPHOS 99  BILITOT 0.3  PROT 7.5  ALBUMIN 3.1*    Recent Labs Lab 09/27/13 1256  LIPASE 16   CBC:  Recent Labs Lab 09/27/13 1225 09/27/13 1232  WBC 11.6*  --  HGB 10.2* 10.9*  HCT 31.7* 32.0*  MCV 80.5  --   PLT 440*  --    CBG:  Recent Labs Lab 09/27/13 1238  GLUCAP 108*   Microbiology No results found for this or any previous visit (from the past 240 hour(s)).   Procedures and Diagnostic Studies: Ct Abdomen Pelvis W Contrast  09/27/2013   CLINICAL DATA:  Abdominal pain since left nephrectomy on 09/03/2013.  EXAM: CT ABDOMEN AND PELVIS WITH CONTRAST  TECHNIQUE: Multidetector CT imaging of the abdomen and pelvis was performed using the standard protocol following bolus administration of intravenous  contrast.  CONTRAST:  50mL OMNIPAQUE IOHEXOL 300 MG/ML SOLN, 80mL OMNIPAQUE IOHEXOL 300 MG/ML SOLN  COMPARISON:  MRI of the abdomen dated 08/14/2013, PET-CT scan dated 08/06/2013, and CT scan abdomen dated 10/30/2010  FINDINGS: Patient has developed much more extensive pulmonary metastatic disease since the prior PET-CT of 08/06/2013. There are numerous bilateral pulmonary nodules at the lung bases. The largest nodule is at the left lung base posteriorly measuring 13 x 17 mm on image number 10 of series 2 signed report in. The largest nodule on the right is 12 x 10 mm on image number 11 of series 4.  The lesion in the anterior aspect left lobe of the liver has enlarged and now measures 27 x 19 mm. There is a new lesion in the dome of the liver measuring 9 mm.  Biliary tree appears normal. There is a 5 mm lymph node adjacent to the gallbladder on image number 31 of series 2.  Spleen, pancreas, right adrenal gland, and right kidney appear normal. The left adrenal gland is not visualized and may have been removed.  There is minimal postsurgical scar at the site of the previous nephrectomy.  The bowel appears normal.  There is extensive air in the subcutaneous fat superficial to the muscles of the left anterior and lateral abdominal wall. There is no pus collection. This amount of air in the subcutaneous fat is atypical.  There is a tiny amount of air in the bladder.  IMPRESSION: 1. Marked progression of pulmonary metastases and liver metastases since the prior PET-CT scan dated 08/06/2013. 2. Extensive air in the subcutaneous fat superficial to the muscles of the left side of the anterior lateral abdominal wall without fluid collections to suggest an abscess. This amount of air is atypical. This is of unknown etiology. 3. Minimal postsurgical changes at the site of the previous nephrectomy.   Electronically Signed   By: Geanie Cooley M.D.   On: 09/27/2013 14:17    Scheduled Meds: . ciprofloxacin  400 mg Intravenous  Q12H  . enoxaparin (LOVENOX) injection  40 mg Subcutaneous Q24H  . pantoprazole  40 mg Oral Daily  . PARoxetine  20 mg Oral QPM  . polyethylene glycol  17 g Oral Daily  . senna-docusate  1 tablet Oral BID   Continuous Infusions: . sodium chloride 50 mL/hr at 09/27/13 1903    Time spent: 35 minutes with > 50% of time discussing current diagnostic test results, clinical impression and plan of care.    LOS: 1 day   RAMA,CHRISTINA  [facility] Pager [phone].   *Please note that the hospitalists switch teams on Wednesdays. Please call the flow manager at [phone] if you are having difficulty reaching the hospitalist taking care of this patient as she can update you and provide the most up-to-date pager number of provider caring for the patient. If 8PM-8AM, please contact night-coverage at [email or web address], password  [email or web address]  09/28/2013, 8:13 AM

## 2013-09-29 ENCOUNTER — Other Ambulatory Visit (HOSPITAL_COMMUNITY): Payer: Self-pay | Admitting: Interventional Radiology

## 2013-09-29 ENCOUNTER — Inpatient Hospital Stay (HOSPITAL_COMMUNITY): Payer: Medicare HMO

## 2013-09-29 ENCOUNTER — Encounter (HOSPITAL_COMMUNITY): Payer: Self-pay | Admitting: Radiology

## 2013-09-29 DIAGNOSIS — R918 Other nonspecific abnormal finding of lung field: Secondary | ICD-10-CM

## 2013-09-29 DIAGNOSIS — K7689 Other specified diseases of liver: Secondary | ICD-10-CM

## 2013-09-29 DIAGNOSIS — E43 Unspecified severe protein-calorie malnutrition: Secondary | ICD-10-CM | POA: Diagnosis present

## 2013-09-29 DIAGNOSIS — Z85118 Personal history of other malignant neoplasm of bronchus and lung: Secondary | ICD-10-CM

## 2013-09-29 DIAGNOSIS — C7800 Secondary malignant neoplasm of unspecified lung: Secondary | ICD-10-CM

## 2013-09-29 LAB — APTT: aPTT: 33 seconds (ref 24–37)

## 2013-09-29 LAB — PROTIME-INR: Prothrombin Time: 13.5 seconds (ref 11.6–15.2)

## 2013-09-29 MED ORDER — FENTANYL CITRATE 0.05 MG/ML IJ SOLN
INTRAMUSCULAR | Status: AC
  Filled 2013-09-29: qty 4

## 2013-09-29 MED ORDER — MIDAZOLAM HCL 2 MG/2ML IJ SOLN
INTRAMUSCULAR | Status: AC
  Filled 2013-09-29: qty 4

## 2013-09-29 MED ORDER — MIDAZOLAM HCL 2 MG/2ML IJ SOLN
INTRAMUSCULAR | Status: AC | PRN
  Administered 2013-09-29: 2 mg via INTRAVENOUS

## 2013-09-29 MED ORDER — ENSURE COMPLETE PO LIQD
237.0000 mL | Freq: Three times a day (TID) | ORAL | Status: DC
  Administered 2013-09-29 – 2013-09-30 (×3): 237 mL via ORAL

## 2013-09-29 MED ORDER — FENTANYL CITRATE 0.05 MG/ML IJ SOLN
INTRAMUSCULAR | Status: AC | PRN
  Administered 2013-09-29 (×4): 50 ug via INTRAVENOUS

## 2013-09-29 MED ORDER — IOHEXOL 300 MG/ML  SOLN
80.0000 mL | Freq: Once | INTRAMUSCULAR | Status: AC | PRN
  Administered 2013-09-29: 80 mL via INTRAVENOUS

## 2013-09-29 NOTE — Progress Notes (Signed)
[facility] PROGRESS NOTE   Kaylee [name] ZOX:[id number] DOB: [birth year] DOA: 09/27/2013 PCP: Gwen Pounds, MD  Brief narrative: Kaylee [name] is an 72 y.o. 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 Berneice Heinrich who was admitted on 09/27/2013 with complaints of left flank pain, sharp, radiating to groin, pre-syncope, insomnia, anorexia, anxiety and polyuria. Upon initial evaluation in the ER, the patient was noted to have evidence of enlarging pulmonary and hepatic metastases as well as evidence of a urinary tract infection, as well as extensive air in the subcutaneous fat superficial to the muscles of the left anterior and lateral abdominal wall, but no evidence of abscess or pus.  Assessment/Plan: Principal Problem:  Renal cell carcinoma / abdominal pain  Pain likely from subcutaneous air.  Unfortunately, scans also showed evidence of disease progression. Requested oncology evaluation. Discussed case with Dr. Welton Flakes who recommends obtaining a CT guided biopsy of one of the lung lesions.  Underwent CT guided biopsy of a liver metastasis since biopsy of a lung lesion was considered to be more risky.  Treat pain with oxycodone and morphine as needed. Dr. Berneice Heinrich sent an email informing him of her admission. Seen by Dr. Bertis Ruddy who recommends an MRI of the brain and outpatient follow up for consideration of chemo once she has completely healed from her recent nephrectomy. Active Problems:  Normocytic anemia Likely from recent operative losses, hemoglobin was normal back in 2010.  Anemia of chronic disease may also be contributory. Anorexia / Severe malnutrition in the context of chronic illness Likely cancer related or from post-op sequelae. Dietician saw patient 09/29/13, continue Ensure TID as recommended.  Depressive disorder, not elsewhere classified  Continue Xanax and Paxil.  Urinary  tract infection  Followup urine cultures. Empiric Cipro.  Dizziness  Orthostatics show a drop in her systolic pressure 23 mm/Hg from sitting to standing.  Hydrate.  Code Status: Full.  Family Communication: Sister updated at the bedside.  Disposition Plan: Home when stable.   IV access:  Peripheral IV  Medical Consultants:  Dr. Artis Delay, Oncology  Other Consultants:  None.  Anti-infectives:  Cipro 09/27/13--->  HPI/Subjective: Kaylee [name] continues to have a sore, tender abdomen and left flank pain, but no nausea or vomiting.  Appetite picking up some.  No diarrhea.  Objective: Filed Vitals:   09/28/13 0539 09/28/13 1504 09/28/13 2104 09/29/13 0534  BP: 110/64 109/66 118/69 130/78  Pulse: 78 71 74 75  Temp: 98 F (36.7 C) 97.6 F (36.4 C) 97.4 F (36.3 C) 98.5 F (36.9 C)  TempSrc: Oral Oral Oral Oral  Resp: 20 18  18   Height:      Weight:      SpO2: 98% 93% 97% 95%    Intake/Output Summary (Last 24 hours) at 09/29/13 0819 Last data filed at 09/29/13 0449  Gross per 24 hour  Intake    440 ml  Output   1700 ml  Net  -1260 ml    Exam: Gen:  NAD, in good spirits Cardiovascular:  RRR, No M/R/G Respiratory:  Lungs CTAB Gastrointestinal:  Abdomen soft, tender diffusely, + BS Extremities:  No C/E/C  Data Reviewed: Basic Metabolic Panel:  Recent Labs Lab 09/27/13 1232 09/27/13 1254  NA 137  --   K 4.5  --   CL 103  --   GLUCOSE 117*  --   BUN 17  --   CREATININE 1.10 0.95  GFR Estimated Creatinine Clearance: 46.5 ml/min (by C-G formula based on Cr of 0.95). Liver Function Tests:  Recent Labs Lab 09/27/13 1256  AST 17  ALT 10  ALKPHOS 99  BILITOT 0.3  PROT 7.5  ALBUMIN 3.1*    Recent Labs Lab 09/27/13 1256  LIPASE 16   CBC:  Recent Labs Lab 09/27/13 1225 09/27/13 1232  WBC 11.6*  --   HGB 10.2* 10.9*  HCT 31.7* 32.0*  MCV 80.5  --   PLT 440*  --    CBG:  Recent Labs Lab 09/27/13 1238  GLUCAP 108*    Microbiology No results found for this or any previous visit (from the past 240 hour(s)).   Procedures and Diagnostic Studies: Ct Abdomen Pelvis W Contrast  09/27/2013   CLINICAL DATA:  Abdominal pain since left nephrectomy on 09/03/2013.  EXAM: CT ABDOMEN AND PELVIS WITH CONTRAST  TECHNIQUE: Multidetector CT imaging of the abdomen and pelvis was performed using the standard protocol following bolus administration of intravenous contrast.  CONTRAST:  50mL OMNIPAQUE IOHEXOL 300 MG/ML SOLN, 80mL OMNIPAQUE IOHEXOL 300 MG/ML SOLN  COMPARISON:  MRI of the abdomen dated 08/14/2013, PET-CT scan dated 08/06/2013, and CT scan abdomen dated 10/30/2010  FINDINGS: Patient has developed much more extensive pulmonary metastatic disease since the prior PET-CT of 08/06/2013. There are numerous bilateral pulmonary nodules at the lung bases. The largest nodule is at the left lung base posteriorly measuring 13 x 17 mm on image number 10 of series 2 signed report in. The largest nodule on the right is 12 x 10 mm on image number 11 of series 4.  The lesion in the anterior aspect left lobe of the liver has enlarged and now measures 27 x 19 mm. There is a new lesion in the dome of the liver measuring 9 mm.  Biliary tree appears normal. There is a 5 mm lymph node adjacent to the gallbladder on image number 31 of series 2.  Spleen, pancreas, right adrenal gland, and right kidney appear normal. The left adrenal gland is not visualized and may have been removed.  There is minimal postsurgical scar at the site of the previous nephrectomy.  The bowel appears normal.  There is extensive air in the subcutaneous fat superficial to the muscles of the left anterior and lateral abdominal wall. There is no pus collection. This amount of air in the subcutaneous fat is atypical.  There is a tiny amount of air in the bladder.  IMPRESSION: 1. Marked progression of pulmonary metastases and liver metastases since the prior PET-CT scan dated  08/06/2013. 2. Extensive air in the subcutaneous fat superficial to the muscles of the left side of the anterior lateral abdominal wall without fluid collections to suggest an abscess. This amount of air is atypical. This is of unknown etiology. 3. Minimal postsurgical changes at the site of the previous nephrectomy.   Electronically Signed   By: Geanie Cooley M.D.   On: 09/27/2013 14:17    Scheduled Meds: . ciprofloxacin  400 mg Intravenous Q12H  . enoxaparin (LOVENOX) injection  40 mg Subcutaneous Q24H  . pantoprazole  40 mg Oral Daily  . PARoxetine  20 mg Oral QPM  . polyethylene glycol  17 g Oral Daily  . senna-docusate  1 tablet Oral BID   Continuous Infusions:    Time spent: 25 minutes.    LOS: 2 days   Cayetano Mikita  [facility] Pager [phone].   *Please note that the hospitalists switch teams on Wednesdays.  Please call the flow manager at [phone] if you are having difficulty reaching the hospitalist taking care of this patient as she can update you and provide the most up-to-date pager number of provider caring for the patient. If 8PM-8AM, please contact night-coverage at [email or web address], password [facility]  09/29/2013, 8:19 AM

## 2013-09-29 NOTE — Progress Notes (Signed)
INITIAL NUTRITION ASSESSMENT  Pt meets criteria for severe MALNUTRITION in the context of chronic illness as evidenced by <75% estimated energy intake with 10% weight loss in the past 2 months per pt report.  DOCUMENTATION CODES Per approved criteria  -Severe malnutrition in the context of chronic illness   INTERVENTION: - Ensure Complete TID - Encouraged increased meal intake - Educated pt on high calorie/protein diet - Will continue to monitor   NUTRITION DIAGNOSIS: Unintended weight loss related to poor appetite as evidenced by pt report.    Goal: 1. Pt to consume >90% of meals/supplements 2. Stable weight  Monitor:  Weights, labs, intake  Reason for Assessment: Malnutrition screening tool, consult  72 y.o. female  Admitting Dx: Renal cell carcinoma  ASSESSMENT: Pt is an 72 y.o. 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 Berneice Heinrich who reports that since her surgery, she has had left flank pain, sharp, radiating to groin, pre-syncope, insomnia, anorexia, anxiety and polyuria. "I'm afraid to stand up because I'm afraid I'll get dizzy". Says she is dizzy, even when lying down. Called Dr. Berneice Heinrich and was advised to come to the ER for further evaluation, where she was noted to have progression of her cancer with evidence of enlarging pulmonary and hepatic metastasis when compared to PET scan done 08/06/13 as well as nitrates in her urine.   Met with pt and sister who report pt has had poor appetite for the past 2 years. States she sometimes eats only 1 meal/day. Likes to go out to eat and get a plate of vegetables for dinner or make herself a fried eggs/ham/cheese sandwich. Drinks 1 Ensure/day. Reports losing 14 pounds unintentionally in the past 2 months.   Height: Ht Readings from Last 1 Encounters:  09/27/13 5\' 7"  (1.702 m)    Weight: Wt Readings from Last 1 Encounters:  09/27/13 125  lb (56.7 kg)    Ideal Body Weight: 135 lb  % Ideal Body Weight: 92%  Wt Readings from Last 10 Encounters:  09/27/13 125 lb (56.7 kg)  09/15/13 137 lb 5.6 oz (62.3 kg)  09/15/13 137 lb 5.6 oz (62.3 kg)  09/09/13 128 lb 4 oz (58.174 kg)  09/03/13 129 lb 6.4 oz (58.695 kg)  04/20/10 130 lb (58.968 kg)  12/21/09 136 lb (61.689 kg)  12/22/08 134 lb 8 oz (61.009 kg)  08/06/08 133 lb (60.328 kg)  07/13/08 133 lb 12.8 oz (60.691 kg)    Usual Body Weight: 139 lb per pt  % Usual Body Weight: 90%  BMI:  Body mass index is 19.57 kg/(m^2).  Estimated Nutritional Needs: Kcal: 1700-1900 Protein: 70-85g Fluid: 1.7-1.9L/day  Skin: Abdominal incision  Diet Order: General  EDUCATION NEEDS: -Education needs addressed - educated pt on high calorie/protein diet and provided handouts with RD contact information    Intake/Output Summary (Last 24 hours) at 09/29/13 1510 Last data filed at 09/29/13 0449  Gross per 24 hour  Intake    120 ml  Output   1400 ml  Net  -1280 ml    Last BM: 12/1  Labs:   Recent Labs Lab 09/27/13 1232 09/27/13 1254  NA 137  --   K 4.5  --   CL 103  --   BUN 17  --   CREATININE 1.10 0.95  GLUCOSE 117*  --     CBG (last 3)   Recent Labs  09/27/13 1238  GLUCAP 108*  Scheduled Meds: . ciprofloxacin  400 mg Intravenous Q12H  . enoxaparin (LOVENOX) injection  40 mg Subcutaneous Q24H  . fentaNYL      . midazolam      . pantoprazole  40 mg Oral Daily  . PARoxetine  20 mg Oral QPM  . polyethylene glycol  17 g Oral Daily  . senna-docusate  1 tablet Oral BID    Continuous Infusions:   Past Medical History  Diagnosis Date  . Depression   . Back pain   . Lung cancer     s/p resection  . COPD (chronic obstructive pulmonary disease)   . Headache(784.0)     HX MIGRAINES   . GERD (gastroesophageal reflux disease)   . Renal cell cancer Left  . Frequency of urination   . Urgency of urination   . Nocturia   . Hernia     L LOWER ABD  .  Difficulty sleeping     Past Surgical History  Procedure Laterality Date  . Lung surgery  1985    RT UPPER LOBE REMOVED FOR CANCER - NOT FRUTHER TX  . Breast surgery  1988    AUGMENTATION  . Back surgery  1985  . Abdominal hysterectomy  1970  . Hand surgery      LEFT  . Robot assisted laparoscopic nephrectomy Left 09/15/2013    Procedure: ROBOTIC ASSISTED LAPAROSCOPIC NEPHRECTOMY;  Surgeon: Sebastian Ache, MD;  Location: [facility];  Service: Urology;  Laterality: Left;    Levon Hedger MS, RD, LDN [phone] Pager [phone] After Hours Pager

## 2013-09-29 NOTE — H&P (Signed)
Kaylee [name] is an 72 y.o. female.   Chief Complaint: scheduled for liver mass biopsy Pt with hx of lung ca/ RUL resection 1985 Hx renal cell ca/L nephrectomy 08/2013 Re admitted for flank pain: CT reveals subcu gas L flank Also shows progression of lung nodules and liver mets Dr Grace Isaac has reviewed all imaging. Rec: due to high risk lung bx (ptx/ COPD); will bx liver mass  HPI: Lung Ca; renal cell Ca; COPD; still smoker  Past Medical History  Diagnosis Date  . Depression   . Back pain   . Lung cancer     s/p resection  . COPD (chronic obstructive pulmonary disease)   . Headache(784.0)     HX MIGRAINES   . GERD (gastroesophageal reflux disease)   . Renal cell cancer Left  . Frequency of urination   . Urgency of urination   . Nocturia   . Hernia     L LOWER ABD  . Difficulty sleeping     Past Surgical History  Procedure Laterality Date  . Lung surgery  1985    RT UPPER LOBE REMOVED FOR CANCER - NOT FRUTHER TX  . Breast surgery  1988    AUGMENTATION  . Back surgery  1985  . Abdominal hysterectomy  1970  . Hand surgery      LEFT  . Robot assisted laparoscopic nephrectomy Left 09/15/2013    Procedure: ROBOTIC ASSISTED LAPAROSCOPIC NEPHRECTOMY;  Surgeon: Sebastian Ache, MD;  Location: [facility];  Service: Urology;  Laterality: Left;    Family History  Problem Relation Age of Onset  . Cancer Mother     Melanoma  . Heart disease Father   . Stroke Father   . Cancer Maternal Grandmother     Colon cancer  . Cancer Brother     Colon cancer  . Cancer Sister     Adrenal gland cancer   Social History:  reports that she has been smoking Cigarettes.  She has a 27 pack-year smoking history. She uses smokeless tobacco. She reports that she does not drink alcohol or use illicit drugs.  Allergies:  Allergies  Allergen Reactions  . Penicillins     unknown  . Codeine Swelling and Rash    Medications Prior to Admission  Medication Sig Dispense Refill  . ALPRAZolam  (XANAX) 0.25 MG tablet Take 0.25 mg by mouth 2 (two) times daily as needed for anxiety or sleep.       Marland Kitchen omeprazole (PRILOSEC) 40 MG capsule Take 40 mg by mouth daily.      Marland Kitchen oxyCODONE-acetaminophen (PERCOCET/ROXICET) 5-325 MG per tablet Take 1 tablet by mouth every 4 (four) hours as needed for severe pain. Postoperatively.  40 tablet  0  . PARoxetine (PAXIL) 20 MG tablet Take 20 mg by mouth every evening.      . polyethylene glycol (MIRALAX / GLYCOLAX) packet Take 17 g by mouth daily.      Marland Kitchen senna-docusate (SENOKOT-S) 8.6-50 MG per tablet Take 1 tablet by mouth 2 (two) times daily. While taking pain meds to prevent constipation.  30 tablet  1    Results for orders placed during the hospital encounter of 09/27/13 (from the past 48 hour(s))  CBC     Status: Abnormal   Collection Time    09/27/13 12:25 PM      Result Value Range   WBC 11.6 (*) 4.0 - 10.5 K/uL   RBC 3.94  3.87 - 5.11 MIL/uL   Hemoglobin 10.2 (*) 12.0 -  15.0 g/dL   HCT 45.4 (*) 09.8 - 11.9 %   MCV 80.5  78.0 - 100.0 fL   MCH 25.9 (*) 26.0 - 34.0 pg   MCHC 32.2  30.0 - 36.0 g/dL   RDW 14.7 (*) 82.9 - 56.2 %   Platelets 440 (*) 150 - 400 K/uL  POCT I-STAT, CHEM 8     Status: Abnormal   Collection Time    09/27/13 12:32 PM      Result Value Range   Sodium 137  135 - 145 mEq/L   Potassium 4.5  3.5 - 5.1 mEq/L   Chloride 103  96 - 112 mEq/L   BUN 17  6 - 23 mg/dL   Creatinine, Ser 1.30  0.50 - 1.10 mg/dL   Glucose, Bld 865 (*) 70 - 99 mg/dL   Calcium, Ion 7.84  6.96 - 1.30 mmol/L   TCO2 23  0 - 100 mmol/L   Hemoglobin 10.9 (*) 12.0 - 15.0 g/dL   HCT 29.5 (*) 28.4 - 13.2 %  GLUCOSE, CAPILLARY     Status: Abnormal   Collection Time    09/27/13 12:38 PM      Result Value Range   Glucose-Capillary 108 (*) 70 - 99 mg/dL   Comment 1 Documented in Chart     Comment 2 Notify RN    CREATININE, SERUM     Status: Abnormal   Collection Time    09/27/13 12:54 PM      Result Value Range   Creatinine, Ser 0.95  0.50 - 1.10  mg/dL   GFR calc non Af Amer 58 (*) >90 mL/min   GFR calc Af Amer 67 (*) >90 mL/min   Comment: (NOTE)     The eGFR has been calculated using the CKD EPI equation.     This calculation has not been validated in all clinical situations.     eGFR's persistently <90 mL/min signify possible Chronic Kidney     Disease.  HEPATIC FUNCTION PANEL     Status: Abnormal   Collection Time    09/27/13 12:56 PM      Result Value Range   Total Protein 7.5  6.0 - 8.3 g/dL   Albumin 3.1 (*) 3.5 - 5.2 g/dL   AST 17  0 - 37 U/L   ALT 10  0 - 35 U/L   Alkaline Phosphatase 99  39 - 117 U/L   Total Bilirubin 0.3  0.3 - 1.2 mg/dL   Bilirubin, Direct <4.4  0.0 - 0.3 mg/dL   Comment: REPEATED TO VERIFY   Indirect Bilirubin NOT CALCULATED  0.3 - 0.9 mg/dL  LIPASE, BLOOD     Status: None   Collection Time    09/27/13 12:56 PM      Result Value Range   Lipase 16  11 - 59 U/L  URINALYSIS, ROUTINE W REFLEX MICROSCOPIC     Status: Abnormal   Collection Time    09/27/13  2:18 PM      Result Value Range   Color, Urine YELLOW  YELLOW   APPearance CLEAR  CLEAR   Specific Gravity, Urine 1.012  1.005 - 1.030   pH 6.5  5.0 - 8.0   Glucose, UA NEGATIVE  NEGATIVE mg/dL   Hgb urine dipstick NEGATIVE  NEGATIVE   Bilirubin Urine NEGATIVE  NEGATIVE   Ketones, ur NEGATIVE  NEGATIVE mg/dL   Protein, ur NEGATIVE  NEGATIVE mg/dL   Urobilinogen, UA 0.2  0.0 - 1.0 mg/dL  Nitrite POSITIVE (*) NEGATIVE   Leukocytes, UA NEGATIVE  NEGATIVE  URINE MICROSCOPIC-ADD ON     Status: Abnormal   Collection Time    09/27/13  2:18 PM      Result Value Range   Squamous Epithelial / LPF RARE  RARE   WBC, UA 0-2  <3 WBC/hpf   Bacteria, UA MANY (*) RARE  PROTIME-INR     Status: None   Collection Time    09/29/13  7:55 AM      Result Value Range   Prothrombin Time 13.5  11.6 - 15.2 seconds   INR 1.05  0.00 - 1.49  APTT     Status: None   Collection Time    09/29/13  7:55 AM      Result Value Range   aPTT 33  24 - 37 seconds    Ct Abdomen Pelvis W Contrast  09/27/2013   CLINICAL DATA:  Abdominal pain since left nephrectomy on 09/03/2013.  EXAM: CT ABDOMEN AND PELVIS WITH CONTRAST  TECHNIQUE: Multidetector CT imaging of the abdomen and pelvis was performed using the standard protocol following bolus administration of intravenous contrast.  CONTRAST:  50mL OMNIPAQUE IOHEXOL 300 MG/ML SOLN, 80mL OMNIPAQUE IOHEXOL 300 MG/ML SOLN  COMPARISON:  MRI of the abdomen dated 08/14/2013, PET-CT scan dated 08/06/2013, and CT scan abdomen dated 10/30/2010  FINDINGS: Patient has developed much more extensive pulmonary metastatic disease since the prior PET-CT of 08/06/2013. There are numerous bilateral pulmonary nodules at the lung bases. The largest nodule is at the left lung base posteriorly measuring 13 x 17 mm on image number 10 of series 2 signed report in. The largest nodule on the right is 12 x 10 mm on image number 11 of series 4.  The lesion in the anterior aspect left lobe of the liver has enlarged and now measures 27 x 19 mm. There is a new lesion in the dome of the liver measuring 9 mm.  Biliary tree appears normal. There is a 5 mm lymph node adjacent to the gallbladder on image number 31 of series 2.  Spleen, pancreas, right adrenal gland, and right kidney appear normal. The left adrenal gland is not visualized and may have been removed.  There is minimal postsurgical scar at the site of the previous nephrectomy.  The bowel appears normal.  There is extensive air in the subcutaneous fat superficial to the muscles of the left anterior and lateral abdominal wall. There is no pus collection. This amount of air in the subcutaneous fat is atypical.  There is a tiny amount of air in the bladder.  IMPRESSION: 1. Marked progression of pulmonary metastases and liver metastases since the prior PET-CT scan dated 08/06/2013. 2. Extensive air in the subcutaneous fat superficial to the muscles of the left side of the anterior lateral abdominal wall  without fluid collections to suggest an abscess. This amount of air is atypical. This is of unknown etiology. 3. Minimal postsurgical changes at the site of the previous nephrectomy.   Electronically Signed   By: Geanie Cooley M.D.   On: 09/27/2013 14:17    Review of Systems  Constitutional: Positive for weight loss. Negative for fever.  Respiratory: Positive for shortness of breath.   Cardiovascular: Positive for chest pain.  Gastrointestinal: Positive for abdominal pain. Negative for nausea and vomiting.  Genitourinary: Positive for frequency and flank pain.  Neurological: Positive for weakness. Negative for dizziness.  Psychiatric/Behavioral: Positive for substance abuse.  Smoker    Blood pressure 130/78, pulse 75, temperature 98.5 F (36.9 C), temperature source Oral, resp. rate 18, height 5\' 7"  (1.702 m), weight 125 lb (56.7 kg), SpO2 95.00%. Physical Exam  Constitutional: She is oriented to person, place, and time. She appears well-developed.  Cardiovascular: Normal rate, regular rhythm and normal heart sounds.   No murmur heard. Respiratory: Effort normal. She has wheezes.  GI: Soft. Bowel sounds are normal. There is no tenderness.  Musculoskeletal: Normal range of motion.  Neurological: She is alert and oriented to person, place, and time.  Skin: Skin is dry.  Psychiatric: She has a normal mood and affect. Her behavior is normal. Judgment and thought content normal.     Assessment/Plan Liver mass; lung nodules; progression of findings on recent CT compared to 07/2013 Scheduled now for liver mass biopsy Pt has hx lung and renal cell ca She is aware of procedure benefits and risks and agreeable to proceed Consent signed and in chart  Macil Crady A 09/29/2013, 9:20 AM

## 2013-09-29 NOTE — ED Notes (Signed)
pre sedation case interrupted. Now resumed in CT instead of [place]

## 2013-09-29 NOTE — Consult Note (Signed)
[facility] CONSULT NOTE  Patient Care Team: Gwen Pounds, MD as PCP - General (Internal Medicine)  CHIEF COMPLAINTS/PURPOSE OF CONSULTATION:  Kidney cancer, multiple bilateral lung metastasis and liver mass  HISTORY OF PRESENTING ILLNESS:  Kaylee [name] 72 y.o. female is here because of abnormal imaging study, abdominal pain and recent diagnosis of kidney cancer. This patient had background history of lung cancer diagnosed in 1985 and underwent lung resection. The subtype of lung cancer and staging was unknown. She never received adjuvant treatment. The patient is an active smoker. 2 months ago, she had a fall and was evaluated at urgent care. She had imaging study they came back abnormal. She was recommended a PET scan. The PET scan dated 07/21/2013 showed multiple, bilateral lung nodules as well as a large kidney mass with evidence of tumor extension into left renal vein. She was referred to the urologist. On 09/16/2013, she underwent robotic-assisted laparoscopic left radical nephrectomy. She was subsequently discharged on 09/17/2013. On 09/27/2013, she was readmitted to the hospital due to dizziness, frequent urination and difficulties tolerating oral diet. She was evaluated in the emergency department. At the ER, imaging study of the abdomen revealed significant progression and more extensive pulmonary metastasis as well as new lesions in the liver. She was admitted to the hospital for pain control, IV fluids as well as medication to control the nausea. Today, she underwent CT-guided biopsy confirmed the diagnosis of metastatic cancer.  MEDICAL HISTORY:  Past Medical History  Diagnosis Date  . Depression   . Back pain   . Lung cancer     s/p resection  . COPD (chronic obstructive pulmonary disease)   . Headache(784.0)     HX MIGRAINES   . GERD (gastroesophageal reflux disease)   . Renal cell cancer Left  . Frequency of urination   . Urgency of urination   .  Nocturia   . Hernia     L LOWER ABD  . Difficulty sleeping     SURGICAL HISTORY: Past Surgical History  Procedure Laterality Date  . Lung surgery  1985    RT UPPER LOBE REMOVED FOR CANCER - NOT FRUTHER TX  . Breast surgery  1988    AUGMENTATION  . Back surgery  1985  . Abdominal hysterectomy  1970  . Hand surgery      LEFT  . Robot assisted laparoscopic nephrectomy Left 09/15/2013    Procedure: ROBOTIC ASSISTED LAPAROSCOPIC NEPHRECTOMY;  Surgeon: Sebastian Ache, MD;  Location: [facility] ORS;  Service: Urology;  Laterality: Left;    SOCIAL HISTORY: History   Social History  . Marital Status: Widowed    Spouse Name: N/A    Number of Children: 1  . Years of Education: 12   Occupational History  . Prior Doctor, general practice work    Social History Main Topics  . Smoking status: Current Every Day Smoker -- 0.50 packs/day for 54 years    Types: Cigarettes  . Smokeless tobacco: Current User  . Alcohol Use: No  . Drug Use: No  . Sexual Activity: No   Other Topics Concern  . Not on file   Social History Narrative   Widowed since July 2009.  Lives alone.  Ambulates without assistance.     FAMILY HISTORY: Family History  Problem Relation Age of Onset  . Cancer Mother     Melanoma  . Heart disease Father   . Stroke Father   . Cancer Maternal Grandmother     Colon cancer  .  Cancer Brother     Colon cancer  . Cancer Sister     Adrenal gland cancer    ALLERGIES:  is allergic to penicillins and codeine.  MEDICATIONS:  Current Facility-Administered Medications  Medication Dose Route Frequency Provider Last Rate Last Dose  . acetaminophen (TYLENOL) tablet 650 mg  650 mg Oral Q6H PRN Maryruth Bun Rama, MD       Or  . acetaminophen (TYLENOL) suppository 650 mg  650 mg Rectal Q6H PRN Maryruth Bun Rama, MD      . ALPRAZolam Prudy Feeler) tablet 0.25 mg  0.25 mg Oral BID PRN Maryruth Bun Rama, MD   0.25 mg at 09/28/13 2313  . alum & mag hydroxide-simeth (MAALOX/MYLANTA) 200-200-20 MG/5ML  suspension 30 mL  30 mL Oral Q6H PRN Christina P Rama, MD      . ciprofloxacin (CIPRO) IVPB 400 mg  400 mg Intravenous Q12H Maryruth Bun Rama, MD   400 mg at 09/29/13 0449  . enoxaparin (LOVENOX) injection 40 mg  40 mg Subcutaneous Q24H Maryruth Bun Rama, MD   40 mg at 09/27/13 2237  . feeding supplement (ENSURE COMPLETE) (ENSURE COMPLETE) liquid 237 mL  237 mL Oral TID BM Lavena Bullion, RD      . fentaNYL (SUBLIMAZE) 0.05 MG/ML injection           . midazolam (VERSED) 2 MG/2ML injection           . morphine 4 MG/ML injection 4 mg  4 mg Intravenous Q2H PRN Maryruth Bun Rama, MD   4 mg at 09/28/13 4098  . ondansetron (ZOFRAN) tablet 4 mg  4 mg Oral Q6H PRN Maryruth Bun Rama, MD       Or  . ondansetron (ZOFRAN) injection 4 mg  4 mg Intravenous Q6H PRN Maryruth Bun Rama, MD   4 mg at 09/29/13 0748  . oxyCODONE-acetaminophen (PERCOCET/ROXICET) 5-325 MG per tablet 1 tablet  1 tablet Oral Q4H PRN Maryruth Bun Rama, MD   1 tablet at 09/28/13 1952  . pantoprazole (PROTONIX) EC tablet 40 mg  40 mg Oral Daily Maryruth Bun Rama, MD   40 mg at 09/28/13 0946  . PARoxetine (PAXIL) tablet 20 mg  20 mg Oral QPM Maryruth Bun Rama, MD   20 mg at 09/28/13 1815  . polyethylene glycol (MIRALAX / GLYCOLAX) packet 17 g  17 g Oral Daily Maryruth Bun Rama, MD   17 g at 09/28/13 0945  . senna-docusate (Senokot-S) tablet 1 tablet  1 tablet Oral BID Maryruth Bun Rama, MD   1 tablet at 09/28/13 1953    REVIEW OF SYSTEMS:   Constitutional: Denies fevers, chills or abnormal night sweats. She has lost about 20 pounds of weight over many months Eyes: Denies blurriness of vision, double vision or watery eyes Ears, nose, mouth, throat, and face: Denies mucositis or sore throat Respiratory: Denies cough, dyspnea or wheezes Cardiovascular: Denies palpitation, chest discomfort or lower extremity swelling Skin: Denies abnormal skin rashes Lymphatics: Denies new lymphadenopathy or easy bruising Neurological:Denies numbness, tingling or  new weaknesses Behavioral/Psych: Mood is stable, no new changes  All other systems were reviewed with the patient and are negative.  PHYSICAL EXAMINATION: ECOG PERFORMANCE STATUS: 2 - Symptomatic, <50% confined to bed  Filed Vitals:   09/29/13 1515  BP: 99/62  Pulse: 80  Temp: 97.7 F (36.5 C)  Resp: 20   Filed Weights   09/27/13 1802  Weight: 125 lb (56.7 kg)    GENERAL:alert, no distress and comfortable. She  looks thin and mildly cachectic SKIN: skin color, texture, turgor are normal, no rashes or significant lesions EYES: normal, conjunctiva are pink and non-injected, sclera clear OROPHARYNX:no exudate, no erythema and lips, buccal mucosa, and tongue normal  NECK: supple, thyroid normal size, non-tender, without nodularity LYMPH:  no palpable lymphadenopathy in the cervical, axillary or inguinal LUNGS: clear to auscultation and percussion with normal breathing effort HEART: regular rate & rhythm and no murmurs and no lower extremity edema ABDOMEN:abdomen soft, non-tender and normal bowel sounds. Mild tenderness some palpation along the surgical site  Musculoskeletal:no cyanosis of digits and no clubbing  PSYCH: alert & oriented x 3 with fluent speech NEURO: no focal motor/sensory deficits  LABORATORY DATA:  I have reviewed the data as listed Lab Results  Component Value Date   WBC 11.6* 09/27/2013   HGB 10.9* 09/27/2013   HCT 32.0* 09/27/2013   MCV 80.5 09/27/2013   PLT 440* 09/27/2013   RADIOGRAPHIC STUDIES: I have personally reviewed the radiological images as listed and agreed with the findings in the report. Ct Chest W Contrast  09/29/2013   CLINICAL DATA:  Cough. Lung cancer diagnosed 1985 status post right upper lobectomy. Left renal cancer diagnosed 2014 status post left nephrectomy.  EXAM: CT CHEST WITH CONTRAST  TECHNIQUE: Multidetector CT imaging of the chest was performed during intravenous contrast administration.  CONTRAST:  80mL OMNIPAQUE IOHEXOL 300 MG/ML   SOLN  COMPARISON:  CT abdomen pelvis dated 09/27/2013. PET-CT dated 08/06/2013.  FINDINGS: Status post right upper lobectomy.  Numerous bilateral pulmonary nodules/metastases throughout all lobes, progressed from prior PET-CT. Representative nodules include:  --12 x 10 mm nodule in the anterior left upper lobe (series 5/image 18), previously 6 mm  --9 x 8 mm nodule in the right middle lobe (series 5/image 20), previously 5 mm  --10 x 12 mm nodule at the right lung base (series 5/image 44), previously 6 mm  --13 x 17 mm nodule at the left lung base (series 5/image 49), previously 9 x 12 mm  Moderate paraseptal emphysematous changes. Left apical pleural parenchymal scarring. No pleural effusion or pneumothorax.  Visualized thyroid is unremarkable.  The heart is normal in size. No pericardial effusion. Mild coronary atherosclerosis. Atherosclerotic calcifications of the aortic arch.  Small mediastinal lymph nodes measuring up to 7 mm, within normal limits. No suspicious hilar or axillary lymphadenopathy.  Bilateral breast augmentation.  Visualized upper abdomen is notable for hepatic metastases (series 2/images 42 and 47), better evaluated on recent CT abdomen pelvis. Postsurgical changes/fluid related to prior left nephrectomy (series 2/image 58), incompletely visualized.  Mild degenerative changes of the visualized thoracolumbar spine.  IMPRESSION: Status post right upper lobectomy.  Numerous bilateral pulmonary nodules/metastases throughout all lobes, measuring up to 1.7 cm, progressed from recent prior PET-CT.  At least two hepatic metastases, better evaluated on recent CT abdomen/pelvis.  Status post left nephrectomy with associated postsurgical changes/fluid, incompletely visualized.   Electronically Signed   By: Charline Bills M.D.   On: 09/29/2013 11:21   Ct Abdomen Pelvis W Contrast  09/27/2013   CLINICAL DATA:  Abdominal pain since left nephrectomy on 09/03/2013.  EXAM: CT ABDOMEN AND PELVIS WITH  CONTRAST  TECHNIQUE: Multidetector CT imaging of the abdomen and pelvis was performed using the standard protocol following bolus administration of intravenous contrast.  CONTRAST:  50mL OMNIPAQUE IOHEXOL 300 MG/ML SOLN, 80mL OMNIPAQUE IOHEXOL 300 MG/ML SOLN  COMPARISON:  MRI of the abdomen dated 08/14/2013, PET-CT scan dated 08/06/2013, and CT scan abdomen dated 10/30/2010  FINDINGS: Patient has developed much more extensive pulmonary metastatic disease since the prior PET-CT of 08/06/2013. There are numerous bilateral pulmonary nodules at the lung bases. The largest nodule is at the left lung base posteriorly measuring 13 x 17 mm on image number 10 of series 2 signed report in. The largest nodule on the right is 12 x 10 mm on image number 11 of series 4.  The lesion in the anterior aspect left lobe of the liver has enlarged and now measures 27 x 19 mm. There is a new lesion in the dome of the liver measuring 9 mm.  Biliary tree appears normal. There is a 5 mm lymph node adjacent to the gallbladder on image number 31 of series 2.  Spleen, pancreas, right adrenal gland, and right kidney appear normal. The left adrenal gland is not visualized and may have been removed.  There is minimal postsurgical scar at the site of the previous nephrectomy.  The bowel appears normal.  There is extensive air in the subcutaneous fat superficial to the muscles of the left anterior and lateral abdominal wall. There is no pus collection. This amount of air in the subcutaneous fat is atypical.  There is a tiny amount of air in the bladder.  IMPRESSION: 1. Marked progression of pulmonary metastases and liver metastases since the prior PET-CT scan dated 08/06/2013. 2. Extensive air in the subcutaneous fat superficial to the muscles of the left side of the anterior lateral abdominal wall without fluid collections to suggest an abscess. This amount of air is atypical. This is of unknown etiology. 3. Minimal postsurgical changes at the site  of the previous nephrectomy.   Electronically Signed   By: Geanie Cooley M.D.   On: 09/27/2013 14:17   ASSESSMENT:  #1 recent diagnosis of kidney cancer #2 multiple pulmonary metastasis #3 probable liver metastasis #4 remote history of lung cancer PLAN:  I reviewed the most recent operative note. 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 By staging, I suspect she has T3, N0, M1 The findings of multiple, bilateral lung metastasis and liver metastasis are unlikely due to recurrence of lung cancer. Final biopsy from today is pending. I suspect the cause of her metastasis likely due to kidney cancer I told the patient she has metastatic stage IV kidney cancer, which is not curable. Systemic chemotherapy is used as a palliative option to extend life. I will order MRI of the head with contrast to complete her staging. The patient is less than 1 month from her most recent surgery. She is not ready for systemic chemotherapy.  I plan to see her next week in my clinic to discuss chemotherapy options and we will consent her in my clinic next week. In the meantime I recommend aggressive supportive care with IV fluid hydration, pain medicine as needed and anti-emetics as needed.  All questions were answered. The patient knows to call the clinic with any problems, questions or concerns.    Azeem Poorman, MD @T @ 3:41 PM

## 2013-09-29 NOTE — Procedures (Signed)
Technically successful CT guided biopsy of dominant hypoattentuating liver mass worrisome for a RCC met.  No immediate post procedural complications.

## 2013-09-30 ENCOUNTER — Inpatient Hospital Stay (HOSPITAL_COMMUNITY): Payer: Medicare HMO

## 2013-09-30 DIAGNOSIS — C787 Secondary malignant neoplasm of liver and intrahepatic bile duct: Secondary | ICD-10-CM

## 2013-09-30 MED ORDER — CIPROFLOXACIN HCL 500 MG PO TABS: 500.0000 mg | ORAL_TABLET | Freq: Two times a day (BID) | ORAL | Status: DC

## 2013-09-30 MED ORDER — GADOBENATE DIMEGLUMINE 529 MG/ML IV SOLN
10.0000 mL | Freq: Once | INTRAVENOUS | Status: AC | PRN
  Administered 2013-09-30: 10 mL via INTRAVENOUS

## 2013-09-30 MED ORDER — CIPROFLOXACIN HCL 500 MG PO TABS
500.0000 mg | ORAL_TABLET | Freq: Two times a day (BID) | ORAL | Status: DC
  Administered 2013-09-30 – 2013-10-01 (×2): 500 mg via ORAL
  Filled 2013-09-30 (×4): qty 1

## 2013-09-30 MED ORDER — ACETAMINOPHEN 325 MG PO TABS: 650.0000 mg | ORAL_TABLET | Freq: Once | ORAL | Status: DC

## 2013-09-30 NOTE — Progress Notes (Signed)
Kaylee [name]   DOB:06/14/1941   ZO#:[id number]    Subjective: She is feeling a bit better. She still has tenderness along the incision site, managed by current pain medicine. She is eating better. Denies any nausea or vomiting. Denies any urinary frequency or urgency.She denies any recent fever, chills, night sweats or abnormal weight loss  Objective:  Filed Vitals:   09/30/13 1356  BP: 130/72  Pulse: 62  Temp: 97.9 F (36.6 C)  Resp: 18     Intake/Output Summary (Last 24 hours) at 09/30/13 1645 Last data filed at 09/30/13 1401  Gross per 24 hour  Intake   1900 ml  Output   2450 ml  Net   -550 ml    GENERAL:alert, no distress and comfortable SKIN: skin color, texture, turgor are normal, no rashes or significant lesions EYES: normal, Conjunctiva are pink and non-injected, sclera clear OROPHARYNX:no exudate, no erythema and lips, buccal mucosa, and tongue normal  NECK: supple, thyroid normal size, non-tender, without nodularity LYMPH:  no palpable lymphadenopathy in the cervical, axillary or inguinal LUNGS: clear to auscultation and percussion with normal breathing effort HEART: regular rate & rhythm and no murmurs and no lower extremity edema ABDOMEN:abdomen soft, non-tender and normal bowel sounds. Well-healed surgical scar Musculoskeletal:no cyanosis of digits and no clubbing  NEURO: alert & oriented x 3 with fluent speech, no focal motor/sensory deficits   Labs:  Lab Results  Component Value Date   WBC 11.6* 09/27/2013   HGB 10.9* 09/27/2013   HCT 32.0* 09/27/2013   MCV 80.5 09/27/2013   PLT 440* 09/27/2013   NEUTROABS 8.7* 12/22/2008    Studies: I reviewed her MRI of the head which show no evidence of intracranial metastasis. 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 I have made an appointment to see her back in my clinic next week for further discussion. Continue supportive care for now. #2 metastatic disease to the lung #3 metastatic kidney cancer to the liver Overall, the metastatic disease is not causing any chest pain, shortness of breath, or abnormal liver function test. Continue supportive care. #4 UTI The patient is completing a course of antibiotics. She is improving.  I will sign off. She has appointment to see me next week. Please call if questions arise     [facility], Thelma Lorenzetti, MD 09/30/2013  4:45 PM

## 2013-09-30 NOTE — Discharge Summary (Signed)
Physician Discharge Summary  Kaylee [name] XBJ:[id number] DOB: Nov 05, 1940 DOA: 09/27/2013  PCP: Gwen Pounds, MD  Admit date: 09/27/2013 Discharge date: 09/30/2013  Recommendations for Outpatient Follow-up:  1. F/U arranged with Dr. Bertis Ruddy for consideration of chemotherapy.  Discharge Diagnoses:  Principal Problem:    Renal cell carcinoma Active Problems:    Depressive disorder, not elsewhere classified    Urinary tract infection    Dizziness    Anorexia    Abdominal pain    Normocytic anemia    Multiple lung nodules    Protein-calorie malnutrition, severe   Discharge Condition: Stable.  Diet recommendation: Regular.  History of present illness:  Kaylee [name] is an 72 y.o. 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 Berneice Heinrich who was admitted on 09/27/2013 with complaints of left flank pain, sharp, radiating to groin, pre-syncope, insomnia, anorexia, anxiety and polyuria. Upon initial evaluation in the ER, the patient was noted to have evidence of enlarging pulmonary and hepatic metastases as well as evidence of a urinary tract infection, as well as extensive air in the subcutaneous fat superficial to the muscles of the left anterior and lateral abdominal wall, but no evidence of abscess or pus.  Hospital Course by problem:  Principal Problem:  Renal cell carcinoma / abdominal pain  Pain likely from subcutaneous air. Unfortunately, scans also showed evidence of disease progression. Requested oncology evaluation. Discussed case with Dr. Welton Flakes who recommends obtaining a CT guided biopsy of one of the lung lesions. Underwent CT guided biopsy of a liver metastasis since biopsy of a lung lesion was considered to be more risky. 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 Treated pain with oxycodone and morphine as needed. Dr. Berneice Heinrich sent an email  informing him of her admission. Seen by Dr. Bertis Ruddy who recommended an MRI of the brain (performed 09/30/2013 and negative for metastatic involvement) and outpatient follow up for consideration of chemo once she has completely healed from her recent nephrectomy.  Active Problems:  Normocytic anemia  Likely from recent operative losses, hemoglobin was normal back in 2010. Anemia of chronic disease may also be contributory. No indication for transfusion while in the hospital. Anorexia / Severe malnutrition in the context of chronic illness  Likely cancer related or from post-op sequelae. Dietician saw patient 09/29/13, continue Ensure TID as recommended.  Depressive disorder, not elsewhere classified  Continue Xanax and Paxil.  Urinary tract infection  Urine cultures do not appear to have been sent. Empiric Cipro for a total course of 5 days of therapy prescribed.  Dizziness  Orthostatics show a drop in her systolic pressure 23 mm/Hg from sitting to standing. Hydrated.  Procedures:  CT guided liver biopsy done 09/29/2013.  Consultations:  Dr. Artis Delay, Oncology  Dr. Simonne Come, IR  Discharge Exam: Filed Vitals:   09/30/13 1356  BP: 130/72  Pulse: 62  Temp: 97.9 F (36.6 C)  Resp: 18   Filed Vitals:   09/29/13 1718 09/29/13 2106 09/30/13 0529 09/30/13 1356  BP: 113/65 109/62 129/72 130/72  Pulse: 89 73 76 62  Temp: 97.1 F (36.2 C) 98.2 F (36.8 C) 97.8 F (36.6 C) 97.9 F (36.6 C)  TempSrc: Oral Oral Oral Oral  Resp: 16 18 20 18   Height:      Weight:      SpO2: 95% 96% 92% 97%    Gen:  NAD Cardiovascular:  RRR, No M/R/G Respiratory:  Lungs CTAB Gastrointestinal: Abdomen soft, diffusely tender with normal bowel sounds. Extremities: No C/E/C   Discharge Instructions   Future Appointments Provider Department Dept Phone   10/07/2013 11:30 AM [facility] [phone]   10/07/2013 12:00 PM Artis Delay, MD  [facility] [phone]       Medication List    ASK your doctor about these medications       ALPRAZolam 0.25 MG tablet  Commonly known as:  XANAX  Take 0.25 mg by mouth 2 (two) times daily as needed for anxiety or sleep.     omeprazole 40 MG capsule  Commonly known as:  PRILOSEC  Take 40 mg by mouth daily.     oxyCODONE-acetaminophen 5-325 MG per tablet  Commonly known as:  PERCOCET/ROXICET  Take 1 tablet by mouth every 4 (four) hours as needed for severe pain. Postoperatively.     PARoxetine 20 MG tablet  Commonly known as:  PAXIL  Take 20 mg by mouth every evening.     polyethylene glycol packet  Commonly known as:  MIRALAX / GLYCOLAX  Take 17 g by mouth daily.     senna-docusate 8.6-50 MG per tablet  Commonly known as:  Senokot-S  Take 1 tablet by mouth 2 (two) times daily. While taking pain meds to prevent constipation.          The results of significant diagnostics from this hospitalization (including imaging, microbiology, ancillary and laboratory) are listed below for reference.    Significant Diagnostic Studies: Ct Chest W Contrast  09/29/2013   CLINICAL DATA:  Cough. Lung cancer diagnosed 1985 status post right upper lobectomy. Left renal cancer diagnosed 2014 status post left nephrectomy.  EXAM: CT CHEST WITH CONTRAST  TECHNIQUE: Multidetector CT imaging of the chest was performed during intravenous contrast administration.  CONTRAST:  80mL OMNIPAQUE IOHEXOL 300 MG/ML  SOLN  COMPARISON:  CT abdomen pelvis dated 09/27/2013. PET-CT dated 08/06/2013.  FINDINGS: Status post right upper lobectomy.  Numerous bilateral pulmonary nodules/metastases throughout all lobes, progressed from prior PET-CT. Representative nodules include:  --12 x 10 mm nodule in the anterior left upper lobe (series 5/image 18), previously 6 mm  --9 x 8 mm nodule in the right middle lobe (series 5/image 20), previously 5 mm  --10 x 12 mm nodule at the right lung  base (series 5/image 44), previously 6 mm  --13 x 17 mm nodule at the left lung base (series 5/image 49), previously 9 x 12 mm  Moderate paraseptal emphysematous changes. Left apical pleural parenchymal scarring. No pleural effusion or pneumothorax.  Visualized thyroid is unremarkable.  The heart is normal in size. No pericardial effusion. Mild coronary atherosclerosis. Atherosclerotic calcifications of the aortic arch.  Small mediastinal lymph nodes measuring up to 7 mm, within normal limits. No suspicious hilar or axillary lymphadenopathy.  Bilateral breast augmentation.  Visualized upper abdomen is notable for hepatic metastases (series 2/images 42 and 47), better evaluated on recent CT abdomen pelvis. Postsurgical changes/fluid related to prior left nephrectomy (series 2/image 58), incompletely visualized.  Mild degenerative changes of the visualized thoracolumbar spine.  IMPRESSION: Status post right upper lobectomy.  Numerous bilateral pulmonary nodules/metastases throughout all lobes, measuring up to 1.7 cm, progressed from recent prior PET-CT.  At least two hepatic metastases, better evaluated on recent CT abdomen/pelvis.  Status post left nephrectomy with associated postsurgical changes/fluid, incompletely visualized.   Electronically Signed   By: Charline Bills M.D.   On: 09/29/2013  11:21   Mr Laqueta Jean Wo Contrast  09/30/2013   CLINICAL DATA:  History of renal cell carcinoma and lung cancer. Dizziness. Staging for intracranial metastatic disease.  EXAM: MRI HEAD WITHOUT AND WITH CONTRAST  TECHNIQUE: Multiplanar, multiecho pulse sequences of the brain and surrounding structures were obtained without and with intravenous contrast.  CONTRAST:  10mL MULTIHANCE GADOBENATE DIMEGLUMINE 529 MG/ML IV SOLN  COMPARISON:  None.  FINDINGS: No acute infarct.  No intracranial mass or abnormal enhancement.  No intracranial hemorrhage.  Mild small vessel disease type changes.  Atrophy without hydrocephalus.  Major  intracranial vascular structures are patent.  Partial opacification mastoid air cells greater on left. No obstructing lesion posterior superior nasopharynx.  Cervical medullary junction, pituitary region, pineal region and orbital structures unremarkable.  IMPRESSION: No evidence of intracranial metastatic disease.  No acute infarct.  Small vessel disease type changes.  Global atrophy.  Partial opacification mastoid air cells greater on the left.   Electronically Signed   By: Bridgett Larsson M.D.   On: 09/30/2013 12:26   Ct Abdomen Pelvis W Contrast  09/27/2013   CLINICAL DATA:  Abdominal pain since left nephrectomy on 09/03/2013.  EXAM: CT ABDOMEN AND PELVIS WITH CONTRAST  TECHNIQUE: Multidetector CT imaging of the abdomen and pelvis was performed using the standard protocol following bolus administration of intravenous contrast.  CONTRAST:  50mL OMNIPAQUE IOHEXOL 300 MG/ML SOLN, 80mL OMNIPAQUE IOHEXOL 300 MG/ML SOLN  COMPARISON:  MRI of the abdomen dated 08/14/2013, PET-CT scan dated 08/06/2013, and CT scan abdomen dated 10/30/2010  FINDINGS: Patient has developed much more extensive pulmonary metastatic disease since the prior PET-CT of 08/06/2013. There are numerous bilateral pulmonary nodules at the lung bases. The largest nodule is at the left lung base posteriorly measuring 13 x 17 mm on image number 10 of series 2 signed report in. The largest nodule on the right is 12 x 10 mm on image number 11 of series 4.  The lesion in the anterior aspect left lobe of the liver has enlarged and now measures 27 x 19 mm. There is a new lesion in the dome of the liver measuring 9 mm.  Biliary tree appears normal. There is a 5 mm lymph node adjacent to the gallbladder on image number 31 of series 2.  Spleen, pancreas, right adrenal gland, and right kidney appear normal. The left adrenal gland is not visualized and may have been removed.  There is minimal postsurgical scar at the site of the previous nephrectomy.  The bowel  appears normal.  There is extensive air in the subcutaneous fat superficial to the muscles of the left anterior and lateral abdominal wall. There is no pus collection. This amount of air in the subcutaneous fat is atypical.  There is a tiny amount of air in the bladder.  IMPRESSION: 1. Marked progression of pulmonary metastases and liver metastases since the prior PET-CT scan dated 08/06/2013. 2. Extensive air in the subcutaneous fat superficial to the muscles of the left side of the anterior lateral abdominal wall without fluid collections to suggest an abscess. This amount of air is atypical. This is of unknown etiology. 3. Minimal postsurgical changes at the site of the previous nephrectomy.   Electronically Signed   By: Geanie Cooley M.D.   On: 09/27/2013 14:17   US Biopsy  09/29/2013   CLINICAL DATA:  History of left-sided renal cell carcinoma, post left-sided nephrectomy, now with CT findings worrisome for progression / development of hepatic and pulmonary metastases.  Please attempted ultrasound-guided liver lesion biopsy.  EXAM: ULTRASOUND BIOPSY CORE LIVER  COMPARISON:  Chest CT - earlier same day; CT abdomen pelvis -09/27/2013; PET-CT- 07/27/2013; abdominal MRI -08/14/2013  FINDINGS: Exhaustive ultrasound scanning was performed by the dictating radiologist however failed to definitively delineate the known dominant hyperenhancing mass involving the anterior segment of the dome of the right lobe of the liver, demonstrated on preprocedural imaging. As such, the patient was brought to CT for attempted CT-guided biopsy biopsy.  IMPRESSION: Known hyperenhancing dominant mass within the anterior segment of the dome of the right lobe of the liver is not apparent on ultrasound, as such, patient was escorted to CT to undergo attempted CT-guided biopsy.   Electronically Signed   By: Simonne Come M.D.   On: 09/29/2013 14:28   Ct Biopsy  09/29/2013   INDICATION: History of renal cell carcinoma, post nephrectomy,  now with recent findings worrisome for progression of pulmonary and hepatic metastatic disease.  EXAM: CT BIOPSY OF HYPERVASCULAR MASS WITHIN THE DOME OF THE RIGHT LOBE OF THE LIVER  MEDICATIONS: Fentanyl 200 mcg IV; Versed 2 mg IV  ANESTHESIA/SEDATION: Sedation time  25 minutes  CONTRAST:  None  COMPARISON:  Chest CT - 09/29/2013; CT abdomen pelvis -09/27/2013; abdominal MRI -08/14/2013; PET-CT - 08/06/2013  PROCEDURE: Informed consent was obtained from the patient following an explanation of the procedure, risks, benefits and alternatives. A time out was performed prior to the initiation of the procedure.  The patient was positioned supine on the CT table and a limited CT was performed for procedural planning demonstrating grossly unchanged appearance of an approximately 1.6 x 1.3 cm hypo attenuating lesion within the anterior aspect of the dome of the right lobe of the liver (image 2, series 2). Ultrasound scanning was again performed however again failed to delineate a definitive lesion at this location. As such, the procedure was attempted with CT fluoroscopic guidance.  The procedure was planned. The operative site was prepped and draped in the usual sterile fashion. Appropriate trajectory was confirmed with a 22 gauge spinal needle after the adjacent tissues were anesthetized with 1% Lidocaine with epinephrine. Under intermittent CT guidance, a 17 gauge coaxial needle was advanced into the peripheral aspect of the mass. Positioning was confirmed and 3 core needle biopsies were obtained with an 18 gauge core needle biopsy device. The co-axial needle was removed and hemostasis was achieved with manual compression.  A limited postprocedural CT was negative for hemorrhage or additional complication. A dressing was placed. The patient tolerated the procedure well without immediate postprocedural complication.  COMPLICATIONS: None immediate  IMPRESSION: Technically successful CT guided core needle biopsy ofdominant  hypo attenuating lesion within the anterior aspect of the dome of the right lobe of the liver.   Electronically Signed   By: Simonne Come M.D.   On: 09/29/2013 15:53    Labs:  Basic Metabolic Panel:  Recent Labs Lab 09/27/13 1232 09/27/13 1254  NA 137  --   K 4.5  --   CL 103  --   GLUCOSE 117*  --   BUN 17  --   CREATININE 1.10 0.95   GFR Estimated Creatinine Clearance: 46.5 ml/min (by C-G formula based on Cr of 0.95). Liver Function Tests:  Recent Labs Lab 09/27/13 1256  AST 17  ALT 10  ALKPHOS 99  BILITOT 0.3  PROT 7.5  ALBUMIN 3.1*    Recent Labs Lab 09/27/13 1256  LIPASE 16   Coagulation profile  Recent Labs Lab  09/29/13 0755  INR 1.05    CBC:  Recent Labs Lab 09/27/13 1225 09/27/13 1232  WBC 11.6*  --   HGB 10.2* 10.9*  HCT 31.7* 32.0*  MCV 80.5  --   PLT 440*  --    CBG:  Recent Labs Lab 09/27/13 1238  GLUCAP 108*    Time coordinating discharge: 35 minutes.  Signed:  RAMA,CHRISTINA  Pager [phone] [facility] 09/30/2013, 4:52 PM

## 2013-09-30 NOTE — Progress Notes (Signed)
This patient is receiving the antibiotic Cipro by the intravenous route. Based on criteria approved by the Pharmacy and Therapeutics Committee, and the Infectious Disease Division, the antibiotic is being converted to equivalent oral dose form. These criteria include: . Patient being treated for a respiratory tract infection, urinary tract infection, cellulitis, or Clostridium Difficile Associated Diarrhea . The patient is not neutropenic and does not exhibit a GI malabsorption state . The patient is eating (either orally or per tube) and/or has been taking other orally administered medications for at least 24 hours. . The patient is improving clinically (physician assessment and a 24-hour Tmax of < 100.5 F).  If you have questions about this conversion, please contact the pharmacy department. Thank you.  Clance Boll, PharmD, BCPS Pager: [phone] 09/30/2013 1:39 PM

## 2013-09-30 NOTE — Consult Note (Signed)
Reason for Consult:Metastatic Renal Cell Carcinoma  Referring Physician: Hillery Aldo MD  Kaylee [name] is an 72 y.o. female.   HPI:   1 - Metastatic Left Renal Cell Carcinoma - s/p robotic left radical nephrectomy 09/15/2013 for [id number] clear cell renal cell carcioma. Readmitted with non-specific complaints and ER imaging revealed rapid progression of pulmonary and hepatic mets. IR BX of liver 12/8 confirms metastatic RCC.  Medical oncology has now evaluated pt and agrees likely role for systemic therapy.  Cr <1.5.  Today Kaylee [name] is seen in consultation for above. Ambulating, tolerating diet, afebrile, no wound problems.   Past Medical History  Diagnosis Date  . Depression   . Back pain   . Lung cancer     s/p resection  . COPD (chronic obstructive pulmonary disease)   . Headache(784.0)     HX MIGRAINES   . GERD (gastroesophageal reflux disease)   . Renal cell cancer Left  . Frequency of urination   . Urgency of urination   . Nocturia   . Hernia     L LOWER ABD  . Difficulty sleeping     Past Surgical History  Procedure Laterality Date  . Lung surgery  1985    RT UPPER LOBE REMOVED FOR CANCER - NOT FRUTHER TX  . Breast surgery  1988    AUGMENTATION  . Back surgery  1985  . Abdominal hysterectomy  1970  . Hand surgery      LEFT  . Robot assisted laparoscopic nephrectomy Left 09/15/2013    Procedure: ROBOTIC ASSISTED LAPAROSCOPIC NEPHRECTOMY;  Surgeon: Sebastian Ache, MD;  Location: [facility];  Service: Urology;  Laterality: Left;    Family History  Problem Relation Age of Onset  . Cancer Mother     Melanoma  . Heart disease Father   . Stroke Father   . Cancer Maternal Grandmother     Colon cancer  . Cancer Brother     Colon cancer  . Cancer Sister     Adrenal gland cancer    Social History:  reports that Kaylee [name] has been smoking Cigarettes.  Kaylee [name] has a 27 pack-year smoking history. Kaylee [name] uses smokeless tobacco. Kaylee [name] reports that Kaylee [name] does not drink  alcohol or use illicit drugs.  Allergies:  Allergies  Allergen Reactions  . Penicillins     unknown  . Codeine Swelling and Rash    Medications: I have reviewed the patient's current medications.  Results for orders placed during the hospital encounter of 09/27/13 (from the past 48 hour(s))  PROTIME-INR     Status: None   Collection Time    09/29/13  7:55 AM      Result Value Range   Prothrombin Time 13.5  11.6 - 15.2 seconds   INR 1.05  0.00 - 1.49  APTT     Status: None   Collection Time    09/29/13  7:55 AM      Result Value Range   aPTT 33  24 - 37 seconds    Ct Chest W Contrast  09/29/2013   CLINICAL DATA:  Cough. Lung cancer diagnosed 1985 status post right upper lobectomy. Left renal cancer diagnosed 2014 status post left nephrectomy.  EXAM: CT CHEST WITH CONTRAST  TECHNIQUE: Multidetector CT imaging of the chest was performed during intravenous contrast administration.  CONTRAST:  80mL OMNIPAQUE IOHEXOL 300 MG/ML  SOLN  COMPARISON:  CT abdomen pelvis dated 09/27/2013. PET-CT dated 08/06/2013.  FINDINGS: Status post right upper lobectomy.  Numerous bilateral pulmonary nodules/metastases  throughout all lobes, progressed from prior PET-CT. Representative nodules include:  --12 x 10 mm nodule in the anterior left upper lobe (series 5/image 18), previously 6 mm  --9 x 8 mm nodule in the right middle lobe (series 5/image 20), previously 5 mm  --10 x 12 mm nodule at the right lung base (series 5/image 44), previously 6 mm  --13 x 17 mm nodule at the left lung base (series 5/image 49), previously 9 x 12 mm  Moderate paraseptal emphysematous changes. Left apical pleural parenchymal scarring. No pleural effusion or pneumothorax.  Visualized thyroid is unremarkable.  The heart is normal in size. No pericardial effusion. Mild coronary atherosclerosis. Atherosclerotic calcifications of the aortic arch.  Small mediastinal lymph nodes measuring up to 7 mm, within normal limits. No suspicious  hilar or axillary lymphadenopathy.  Bilateral breast augmentation.  Visualized upper abdomen is notable for hepatic metastases (series 2/images 42 and 47), better evaluated on recent CT abdomen pelvis. Postsurgical changes/fluid related to prior left nephrectomy (series 2/image 58), incompletely visualized.  Mild degenerative changes of the visualized thoracolumbar spine.  IMPRESSION: Status post right upper lobectomy.  Numerous bilateral pulmonary nodules/metastases throughout all lobes, measuring up to 1.7 cm, progressed from recent prior PET-CT.  At least two hepatic metastases, better evaluated on recent CT abdomen/pelvis.  Status post left nephrectomy with associated postsurgical changes/fluid, incompletely visualized.   Electronically Signed   By: Charline Bills M.D.   On: 09/29/2013 11:21   Mr Laqueta Jean ZO Contrast  09/30/2013   CLINICAL DATA:  History of renal cell carcinoma and lung cancer. Dizziness. Staging for intracranial metastatic disease.  EXAM: MRI HEAD WITHOUT AND WITH CONTRAST  TECHNIQUE: Multiplanar, multiecho pulse sequences of the brain and surrounding structures were obtained without and with intravenous contrast.  CONTRAST:  10mL MULTIHANCE GADOBENATE DIMEGLUMINE 529 MG/ML IV SOLN  COMPARISON:  None.  FINDINGS: No acute infarct.  No intracranial mass or abnormal enhancement.  No intracranial hemorrhage.  Mild small vessel disease type changes.  Atrophy without hydrocephalus.  Major intracranial vascular structures are patent.  Partial opacification mastoid air cells greater on left. No obstructing lesion posterior superior nasopharynx.  Cervical medullary junction, pituitary region, pineal region and orbital structures unremarkable.  IMPRESSION: No evidence of intracranial metastatic disease.  No acute infarct.  Small vessel disease type changes.  Global atrophy.  Partial opacification mastoid air cells greater on the left.   Electronically Signed   By: Bridgett Larsson M.D.   On: 09/30/2013  12:26   US Biopsy  09/29/2013   CLINICAL DATA:  History of left-sided renal cell carcinoma, post left-sided nephrectomy, now with CT findings worrisome for progression / development of hepatic and pulmonary metastases. Please attempted ultrasound-guided liver lesion biopsy.  EXAM: ULTRASOUND BIOPSY CORE LIVER  COMPARISON:  Chest CT - earlier same day; CT abdomen pelvis -09/27/2013; PET-CT- 07/27/2013; abdominal MRI -08/14/2013  FINDINGS: Exhaustive ultrasound scanning was performed by the dictating radiologist however failed to definitively delineate the known dominant hyperenhancing mass involving the anterior segment of the dome of the right lobe of the liver, demonstrated on preprocedural imaging. As such, the patient was brought to CT for attempted CT-guided biopsy biopsy.  IMPRESSION: Known hyperenhancing dominant mass within the anterior segment of the dome of the right lobe of the liver is not apparent on ultrasound, as such, patient was escorted to CT to undergo attempted CT-guided biopsy.   Electronically Signed   By: Simonne Come M.D.   On: 09/29/2013 14:28  Ct Biopsy  09/29/2013   INDICATION: History of renal cell carcinoma, post nephrectomy, now with recent findings worrisome for progression of pulmonary and hepatic metastatic disease.  EXAM: CT BIOPSY OF HYPERVASCULAR MASS WITHIN THE DOME OF THE RIGHT LOBE OF THE LIVER  MEDICATIONS: Fentanyl 200 mcg IV; Versed 2 mg IV  ANESTHESIA/SEDATION: Sedation time  25 minutes  CONTRAST:  None  COMPARISON:  Chest CT - 09/29/2013; CT abdomen pelvis -09/27/2013; abdominal MRI -08/14/2013; PET-CT - 08/06/2013  PROCEDURE: Informed consent was obtained from the patient following an explanation of the procedure, risks, benefits and alternatives. A time out was performed prior to the initiation of the procedure.  The patient was positioned supine on the CT table and a limited CT was performed for procedural planning demonstrating grossly unchanged appearance of an  approximately 1.6 x 1.3 cm hypo attenuating lesion within the anterior aspect of the dome of the right lobe of the liver (image 2, series 2). Ultrasound scanning was again performed however again failed to delineate a definitive lesion at this location. As such, the procedure was attempted with CT fluoroscopic guidance.  The procedure was planned. The operative site was prepped and draped in the usual sterile fashion. Appropriate trajectory was confirmed with a 22 gauge spinal needle after the adjacent tissues were anesthetized with 1% Lidocaine with epinephrine. Under intermittent CT guidance, a 17 gauge coaxial needle was advanced into the peripheral aspect of the mass. Positioning was confirmed and 3 core needle biopsies were obtained with an 18 gauge core needle biopsy device. The co-axial needle was removed and hemostasis was achieved with manual compression.  A limited postprocedural CT was negative for hemorrhage or additional complication. A dressing was placed. The patient tolerated the procedure well without immediate postprocedural complication.  COMPLICATIONS: None immediate  IMPRESSION: Technically successful CT guided core needle biopsy ofdominant hypo attenuating lesion within the anterior aspect of the dome of the right lobe of the liver.   Electronically Signed   By: Simonne Come M.D.   On: 09/29/2013 15:53    Review of Systems  Constitutional: Positive for weight loss and malaise/fatigue. Negative for fever and chills.  HENT: Negative.   Eyes: Negative.   Respiratory: Negative.   Cardiovascular: Negative.   Gastrointestinal: Positive for constipation. Negative for nausea and vomiting.  Genitourinary: Negative.   Musculoskeletal: Negative.   Skin: Negative.   Neurological: Negative.   Endo/Heme/Allergies: Negative.   Psychiatric/Behavioral: Negative.    Blood pressure 130/72, pulse 62, temperature 97.9 F (36.6 C), temperature source Oral, resp. rate 18, height 5\' 7"  (1.702 m),  weight 56.7 kg (125 lb), SpO2 97.00%. Physical Exam  Constitutional: Kaylee [name] is oriented to person, place, and time. Kaylee [name] appears well-developed and well-nourished.  Thin, in good spirits, at baseline  HENT:  Head: Normocephalic and atraumatic.  Eyes: Pupils are equal, round, and reactive to light.  Neck: Normal range of motion. Neck supple.  Cardiovascular: Normal rate.   Respiratory: Effort normal.  GI: Soft. Bowel sounds are normal. Kaylee [name] exhibits no distension. There is no tenderness. There is no rebound and no guarding.  Recent port sites and extraction sites c/d/i.  Genitourinary:  No CVAT  Musculoskeletal: Normal range of motion.  Neurological: Kaylee [name] is alert and oriented to person, place, and time.  Skin: Skin is warm and dry.  Psychiatric: Kaylee [name] has a normal mood and affect. Her behavior is normal. Judgment and thought content normal.    Assessment/Plan:  1 - Metastatic Left Renal Cell Carcinoma - Now  BX-proven metastatic and s/p cytoreductive nephrectomy last month. Pt with very good understanding of incurable nature of disease but role for systemic therapies in further palliation and slow disease progression. Fortunately her overal renal function remains excellent and Kaylee [name] has high performance status.  I agree with starting systemic therapy at descretion of medical oncology as soon as about 4- 6 weeks post-op, as Kaylee [name] is healing very well.   Call anytime with questions. Kaylee [name] will f/u with me as scheduled on 12/16 in office.  Adysson Revelle 09/30/2013, 5:24 PM

## 2013-09-30 NOTE — Progress Notes (Signed)
Blood orders were entered on this patient in error from night shift RN, discontinued when found  [employer]

## 2013-10-01 NOTE — Progress Notes (Signed)
No acute events overnight Okay for d/c home with Rolluing walker and Home healht PT  Pleas Koch, MD [facility] [phone]

## 2013-10-01 NOTE — Evaluation (Signed)
Physical Therapy Evaluation Patient Details Name: Kaylee [name] MRN: [id number] DOB: [birth year] Today's Date: 10/01/2013 Time: 1140-1156 PT Time Calculation (min): 16 min  PT Assessment / Plan / Recommendation History of Present Illness  72 yo female admitted with renal cell cancer, dizziness. Pt lives alone  Clinical Impression  On eval, pt required Min assist for mobility-able to ambulate ~100 feet. Pt is unsteady and at risk for falls. Encouraged pt to use walker and to have HHPT work with her-pt agreeable to this (notified MD of need for HHPT and walker). Discussed need for 24 hour supervision however pt would not agree to this-"I don't want anyone else around. My sister will be with me in the evenings." Plan is for d/c home later today.     PT Assessment  All further PT needs can be met in the next venue of care    Follow Up Recommendations  Home health PT;Supervision/Assistance - 24 hour (needs 24 hour supervision but pt states she doesn't want someone around all the time. will have sister in evenings-refuses anymore supervision)    Does the patient have the potential to tolerate intense rehabilitation      Barriers to Discharge        Equipment Recommendations  Rolling walker with 5" wheels    Recommendations for Other Services     Frequency      Precautions / Restrictions Precautions Precautions: Fall Precaution Comments: orthostatic per chart Restrictions Weight Bearing Restrictions: No   Pertinent Vitals/Pain No c/o pain      Mobility  Bed Mobility Bed Mobility: Supine to Sit;Sit to Supine Supine to Sit: 6: Modified independent (Device/Increase time) Sit to Supine: 6: Modified independent (Device/Increase time) Details for Bed Mobility Assistance: encouraged pt to sit EOB for several minutes before standing up Transfers Transfers: Sit to Stand;Stand to Sit Sit to Stand: 4: Min guard;From bed Stand to Sit: 4: Min guard;To bed Details for Transfer  Assistance: close guard for safety. unsteady. encouraged pt to stand at EOB for several minutes before taking steps/walking Ambulation/Gait Ambulation/Gait Assistance: 4: Min assist Ambulation Distance (Feet): 100 Feet Assistive device: 1 person hand held assist Ambulation/Gait Assistance Details: Assist to steady throughout ambulation. Fatigues easily.  Gait Pattern: Step-through pattern    Exercises     PT Diagnosis: Difficulty walking;Generalized weakness  PT Problem List: Decreased balance;Decreased mobility;Decreased activity tolerance;Decreased strength PT Treatment Interventions:       PT Goals(Current goals can be found in the care plan section) Acute Rehab PT Goals PT Goal Formulation: No goals set, d/c therapy  Visit Information  Last PT Received On: 10/01/13 Assistance Needed: +1 History of Present Illness: 72 yo female admitted with renal cell cancer, dizziness. Pt lives alone       Prior Functioning  Home Living Family/patient expects to be discharged to:: Private residence Living Arrangements: Alone Available Help at Discharge: Family (sister in evenings; pt states she doesn't want anyone else with her during day) Type of Home: House Home Access: Stairs to enter Secretary/administrator of Steps: 1 Home Layout: One level Home Equipment: None Prior Function Level of Independence: Independent Communication Communication: No difficulties    Cognition  Cognition Arousal/Alertness: Awake/alert Behavior During Therapy: WFL for tasks assessed/performed Overall Cognitive Status: Within Functional Limits for tasks assessed    Extremity/Trunk Assessment Upper Extremity Assessment Upper Extremity Assessment: Generalized weakness Lower Extremity Assessment Lower Extremity Assessment: Generalized weakness Cervical / Trunk Assessment Cervical / Trunk Assessment: Normal   Balance Balance Balance Assessed: Yes  Dynamic Standing Balance Dynamic Standing - Balance  Support: No upper extremity supported;During functional activity Dynamic Standing - Level of Assistance: 4: Min assist  End of Session PT - End of Session Equipment Utilized During Treatment: Gait belt Activity Tolerance: Patient limited by fatigue;Other (comment) (limited by dizziness) Patient left: in bed;with call bell/phone within reach;with family/visitor present  GP     Rebeca Alert, MPT Pager: [phone]

## 2013-10-03 ENCOUNTER — Inpatient Hospital Stay (HOSPITAL_COMMUNITY)
Admission: EM | Admit: 2013-10-03 | Discharge: 2013-10-10 | DRG: 312 | Disposition: A | Discharge status: Skilled Nursing Facility | Payer: Medicare HMO | Attending: Internal Medicine | Admitting: Internal Medicine

## 2013-10-03 ENCOUNTER — Encounter (HOSPITAL_COMMUNITY): Payer: Self-pay | Admitting: Emergency Medicine

## 2013-10-03 ENCOUNTER — Emergency Department (HOSPITAL_COMMUNITY): Payer: Medicare HMO

## 2013-10-03 DIAGNOSIS — Z800 Family history of malignant neoplasm of digestive organs: Secondary | ICD-10-CM

## 2013-10-03 DIAGNOSIS — K219 Gastro-esophageal reflux disease without esophagitis: Secondary | ICD-10-CM

## 2013-10-03 DIAGNOSIS — C787 Secondary malignant neoplasm of liver and intrahepatic bile duct: Secondary | ICD-10-CM | POA: Diagnosis present

## 2013-10-03 DIAGNOSIS — K5730 Diverticulosis of large intestine without perforation or abscess without bleeding: Secondary | ICD-10-CM

## 2013-10-03 DIAGNOSIS — Z860100 Personal history of colon polyps, unspecified: Secondary | ICD-10-CM

## 2013-10-03 DIAGNOSIS — E43 Unspecified severe protein-calorie malnutrition: Secondary | ICD-10-CM

## 2013-10-03 DIAGNOSIS — F17200 Nicotine dependence, unspecified, uncomplicated: Secondary | ICD-10-CM | POA: Diagnosis present

## 2013-10-03 DIAGNOSIS — R079 Chest pain, unspecified: Secondary | ICD-10-CM

## 2013-10-03 DIAGNOSIS — F3289 Other specified depressive episodes: Secondary | ICD-10-CM

## 2013-10-03 DIAGNOSIS — K922 Gastrointestinal hemorrhage, unspecified: Secondary | ICD-10-CM

## 2013-10-03 DIAGNOSIS — M549 Dorsalgia, unspecified: Secondary | ICD-10-CM

## 2013-10-03 DIAGNOSIS — R1310 Dysphagia, unspecified: Secondary | ICD-10-CM | POA: Diagnosis present

## 2013-10-03 DIAGNOSIS — Z8249 Family history of ischemic heart disease and other diseases of the circulatory system: Secondary | ICD-10-CM

## 2013-10-03 DIAGNOSIS — R634 Abnormal weight loss: Secondary | ICD-10-CM

## 2013-10-03 DIAGNOSIS — Z823 Family history of stroke: Secondary | ICD-10-CM

## 2013-10-03 DIAGNOSIS — Z808 Family history of malignant neoplasm of other organs or systems: Secondary | ICD-10-CM

## 2013-10-03 DIAGNOSIS — E871 Hypo-osmolality and hyponatremia: Secondary | ICD-10-CM

## 2013-10-03 DIAGNOSIS — R42 Dizziness and giddiness: Secondary | ICD-10-CM

## 2013-10-03 DIAGNOSIS — D630 Anemia in neoplastic disease: Secondary | ICD-10-CM | POA: Diagnosis present

## 2013-10-03 DIAGNOSIS — R109 Unspecified abdominal pain: Secondary | ICD-10-CM | POA: Diagnosis present

## 2013-10-03 DIAGNOSIS — Z902 Acquired absence of lung [part of]: Secondary | ICD-10-CM

## 2013-10-03 DIAGNOSIS — C649 Malignant neoplasm of unspecified kidney, except renal pelvis: Secondary | ICD-10-CM

## 2013-10-03 DIAGNOSIS — R252 Cramp and spasm: Secondary | ICD-10-CM

## 2013-10-03 DIAGNOSIS — J441 Chronic obstructive pulmonary disease with (acute) exacerbation: Secondary | ICD-10-CM

## 2013-10-03 DIAGNOSIS — I951 Orthostatic hypotension: Principal | ICD-10-CM

## 2013-10-03 DIAGNOSIS — R918 Other nonspecific abnormal finding of lung field: Secondary | ICD-10-CM

## 2013-10-03 DIAGNOSIS — R350 Frequency of micturition: Secondary | ICD-10-CM | POA: Diagnosis present

## 2013-10-03 DIAGNOSIS — H8110 Benign paroxysmal vertigo, unspecified ear: Secondary | ICD-10-CM | POA: Diagnosis present

## 2013-10-03 DIAGNOSIS — D649 Anemia, unspecified: Secondary | ICD-10-CM | POA: Diagnosis present

## 2013-10-03 DIAGNOSIS — E785 Hyperlipidemia, unspecified: Secondary | ICD-10-CM

## 2013-10-03 DIAGNOSIS — Z91199 Patient's noncompliance with other medical treatment and regimen due to unspecified reason: Secondary | ICD-10-CM

## 2013-10-03 DIAGNOSIS — Z905 Acquired absence of kidney: Secondary | ICD-10-CM

## 2013-10-03 DIAGNOSIS — I959 Hypotension, unspecified: Secondary | ICD-10-CM

## 2013-10-03 DIAGNOSIS — N12 Tubulo-interstitial nephritis, not specified as acute or chronic: Secondary | ICD-10-CM | POA: Diagnosis present

## 2013-10-03 DIAGNOSIS — N390 Urinary tract infection, site not specified: Secondary | ICD-10-CM

## 2013-10-03 DIAGNOSIS — J209 Acute bronchitis, unspecified: Secondary | ICD-10-CM

## 2013-10-03 DIAGNOSIS — R197 Diarrhea, unspecified: Secondary | ICD-10-CM | POA: Diagnosis not present

## 2013-10-03 DIAGNOSIS — J4489 Other specified chronic obstructive pulmonary disease: Secondary | ICD-10-CM | POA: Diagnosis present

## 2013-10-03 DIAGNOSIS — IMO0002 Reserved for concepts with insufficient information to code with codable children: Secondary | ICD-10-CM

## 2013-10-03 DIAGNOSIS — Z9119 Patient's noncompliance with other medical treatment and regimen: Secondary | ICD-10-CM

## 2013-10-03 DIAGNOSIS — F411 Generalized anxiety disorder: Secondary | ICD-10-CM

## 2013-10-03 DIAGNOSIS — J449 Chronic obstructive pulmonary disease, unspecified: Secondary | ICD-10-CM | POA: Diagnosis present

## 2013-10-03 DIAGNOSIS — F329 Major depressive disorder, single episode, unspecified: Secondary | ICD-10-CM | POA: Diagnosis present

## 2013-10-03 DIAGNOSIS — Z85118 Personal history of other malignant neoplasm of bronchus and lung: Secondary | ICD-10-CM

## 2013-10-03 DIAGNOSIS — F4321 Adjustment disorder with depressed mood: Secondary | ICD-10-CM

## 2013-10-03 DIAGNOSIS — R630 Anorexia: Secondary | ICD-10-CM

## 2013-10-03 DIAGNOSIS — A498 Other bacterial infections of unspecified site: Secondary | ICD-10-CM | POA: Diagnosis present

## 2013-10-03 DIAGNOSIS — N179 Acute kidney failure, unspecified: Secondary | ICD-10-CM | POA: Diagnosis present

## 2013-10-03 DIAGNOSIS — E876 Hypokalemia: Secondary | ICD-10-CM | POA: Diagnosis not present

## 2013-10-03 DIAGNOSIS — Z880 Allergy status to penicillin: Secondary | ICD-10-CM

## 2013-10-03 DIAGNOSIS — E119 Type 2 diabetes mellitus without complications: Secondary | ICD-10-CM

## 2013-10-03 DIAGNOSIS — Z79899 Other long term (current) drug therapy: Secondary | ICD-10-CM

## 2013-10-03 DIAGNOSIS — C7800 Secondary malignant neoplasm of unspecified lung: Secondary | ICD-10-CM | POA: Diagnosis present

## 2013-10-03 DIAGNOSIS — K4090 Unilateral inguinal hernia, without obstruction or gangrene, not specified as recurrent: Secondary | ICD-10-CM

## 2013-10-03 LAB — COMPREHENSIVE METABOLIC PANEL
ALT: 8 U/L (ref 0–35)
AST: 13 U/L (ref 0–37)
Albumin: 3.4 g/dL — ABNORMAL LOW (ref 3.5–5.2)
CO2: 22 mEq/L (ref 19–32)
Calcium: 9.6 mg/dL (ref 8.4–10.5)
GFR calc non Af Amer: 43 mL/min — ABNORMAL LOW (ref >90)
Glucose, Bld: 155 mg/dL — ABNORMAL HIGH (ref 70–99)
Potassium: 4.3 mEq/L (ref 3.5–5.1)
Sodium: 129 mEq/L — ABNORMAL LOW (ref 135–145)

## 2013-10-03 LAB — CBC WITH DIFFERENTIAL/PLATELET
Basophils Absolute: 0.1 10*3/uL (ref 0.0–0.1)
Eosinophils Relative: 3 % (ref 0–5)
Lymphocytes Relative: 22 % (ref 12–46)
MCV: 80.2 fL (ref 78.0–100.0)
Neutro Abs: 7.7 10*3/uL (ref 1.7–7.7)
Neutrophils Relative %: 67 % (ref 43–77)
Platelets: 353 10*3/uL (ref 150–400)
RDW: 15.9 % — ABNORMAL HIGH (ref 11.5–15.5)
WBC: 11.5 10*3/uL — ABNORMAL HIGH (ref 4.0–10.5)

## 2013-10-03 MED ORDER — ALPRAZOLAM 0.25 MG PO TABS
0.2500 mg | ORAL_TABLET | Freq: Two times a day (BID) | ORAL | Status: DC | PRN
  Administered 2013-10-05 – 2013-10-09 (×6): 0.25 mg via ORAL
  Filled 2013-10-03 (×6): qty 1

## 2013-10-03 MED ORDER — ONDANSETRON HCL 4 MG/2ML IJ SOLN
4.0000 mg | Freq: Four times a day (QID) | INTRAMUSCULAR | Status: DC | PRN
  Administered 2013-10-04 – 2013-10-09 (×4): 4 mg via INTRAVENOUS
  Filled 2013-10-03 (×3): qty 2

## 2013-10-03 MED ORDER — SODIUM CHLORIDE 0.9 % IV SOLN
INTRAVENOUS | Status: DC
  Administered 2013-10-03: 18:00:00 via INTRAVENOUS

## 2013-10-03 MED ORDER — CIPROFLOXACIN HCL 500 MG PO TABS
500.0000 mg | ORAL_TABLET | Freq: Two times a day (BID) | ORAL | Status: AC
  Administered 2013-10-03 – 2013-10-04 (×2): 500 mg via ORAL
  Filled 2013-10-03 (×2): qty 1

## 2013-10-03 MED ORDER — ONDANSETRON HCL 4 MG PO TABS: 4.0000 mg | ORAL_TABLET | Freq: Four times a day (QID) | ORAL | Status: DC | PRN

## 2013-10-03 MED ORDER — OXYCODONE-ACETAMINOPHEN 5-325 MG PO TABS
1.0000 | ORAL_TABLET | ORAL | Status: DC | PRN
  Administered 2013-10-03 – 2013-10-10 (×3): 1 via ORAL
  Filled 2013-10-03 (×3): qty 1

## 2013-10-03 MED ORDER — ACETAMINOPHEN 325 MG PO TABS
650.0000 mg | ORAL_TABLET | Freq: Four times a day (QID) | ORAL | Status: DC | PRN
  Administered 2013-10-04 – 2013-10-09 (×9): 650 mg via ORAL
  Filled 2013-10-03 (×9): qty 2

## 2013-10-03 MED ORDER — ACETAMINOPHEN 650 MG RE SUPP: 650.0000 mg | Freq: Four times a day (QID) | RECTAL | Status: DC | PRN

## 2013-10-03 MED ORDER — POLYETHYLENE GLYCOL 3350 17 G PO PACK
17.0000 g | PACK | Freq: Every day | ORAL | Status: DC
  Administered 2013-10-03 – 2013-10-04 (×2): 17 g via ORAL
  Filled 2013-10-03 (×2): qty 1

## 2013-10-03 MED ORDER — SODIUM CHLORIDE 0.9 % IV BOLUS (SEPSIS)
500.0000 mL | Freq: Once | INTRAVENOUS | Status: AC
  Administered 2013-10-03: 500 mL via INTRAVENOUS

## 2013-10-03 MED ORDER — SODIUM CHLORIDE 0.9 % IJ SOLN
3.0000 mL | Freq: Two times a day (BID) | INTRAMUSCULAR | Status: DC
  Administered 2013-10-05 – 2013-10-08 (×5): 3 mL via INTRAVENOUS

## 2013-10-03 MED ORDER — SODIUM CHLORIDE 0.9 % IV SOLN
INTRAVENOUS | Status: AC
  Administered 2013-10-03: 21:00:00 via INTRAVENOUS

## 2013-10-03 MED ORDER — SENNOSIDES-DOCUSATE SODIUM 8.6-50 MG PO TABS
1.0000 | ORAL_TABLET | Freq: Two times a day (BID) | ORAL | Status: DC
  Administered 2013-10-03 – 2013-10-04 (×2): 1 via ORAL
  Filled 2013-10-03 (×3): qty 1

## 2013-10-03 MED ORDER — PAROXETINE HCL 20 MG PO TABS
20.0000 mg | ORAL_TABLET | Freq: Every evening | ORAL | Status: DC
  Administered 2013-10-03 – 2013-10-09 (×7): 20 mg via ORAL
  Filled 2013-10-03 (×8): qty 1

## 2013-10-03 MED ORDER — PANTOPRAZOLE SODIUM 40 MG PO TBEC
80.0000 mg | DELAYED_RELEASE_TABLET | Freq: Every day | ORAL | Status: DC
  Administered 2013-10-03 – 2013-10-04 (×2): 80 mg via ORAL
  Filled 2013-10-03 (×2): qty 2

## 2013-10-03 MED ORDER — ENOXAPARIN SODIUM 40 MG/0.4ML ~~LOC~~ SOLN
40.0000 mg | SUBCUTANEOUS | Status: DC
  Administered 2013-10-03 – 2013-10-09 (×7): 40 mg via SUBCUTANEOUS
  Filled 2013-10-03 (×8): qty 0.4

## 2013-10-03 NOTE — ED Notes (Signed)
Pt was discharged from hospital home on Wed. Pt's home health RN took BP while lying in bed 122/90's, then when pt sat 85/45, then 65/40 and recommended coming to ED for further eval.

## 2013-10-03 NOTE — Progress Notes (Signed)
   CARE MANAGEMENT ED NOTE 10/03/2013  Patient:  Kaylee [name],Kaylee [name]   Account Number:  [id number]  Date Initiated:  10/03/2013  Documentation initiated by:  Radford Pax  Subjective/Objective Assessment:   Patient prsents to ED with dizziness and generalized weakness.     Subjective/Objective Assessment Detail:   Patient has history of renal cell carcinoma and lung cancer.     Action/Plan:   Action/Plan Detail:   Anticipated DC Date:       Status Recommendation to Physician:   Result of Recommendation:    Other ED Services  Consult Working Plan    DC Planning Services  CM consult  Other    Choice offered to / List presented to:            Status of service:  Completed, signed off  ED Comments:   ED Comments Detail:  [facility] consulted to see patient regarding possible home health needs.  EDCM spoke to patient and her sisters Aggie Cosier and Harriett Sine.  As per patient, her sister Harriett Sine is the person we want to call if, "Anything is going on with me." Harriett Sine phone number is [phone].  Patient lives at home. Paktient's sister Aggie Cosier has been staying with her. Patient is currently receiving home health services with [facility].  Today was the first day PT came out to see her.  She currently is only receiving PT with [facility] as per patient.  Patient claims she was supposed to receive a walker from [facility] but has not received it yet.  Patient has a shower chair at home.  Patient requesting PT evaluation. PT is ordered.  Patient also interested in possible rehab placement on discharge.  [facility] will consult social worker. Explained to patient and family that if she is discharged home, she may also receive a Child psychotherapist with [facility] to assist with placement from home if needed.  Patient and family thankful for information.

## 2013-10-03 NOTE — ED Notes (Signed)
Bed: YN82 Expected date:  Expected time:  Means of arrival:  Comments: Pt still in rm

## 2013-10-03 NOTE — ED Provider Notes (Signed)
CSN: [id number]     Arrival date & time 10/03/13  1423 History   First MD Initiated Contact with Patient 10/03/13 1457     Chief Complaint  Patient presents with  . orthostatic hypotension   . Weakness   (Consider location/radiation/quality/duration/timing/severity/associated sxs/prior Treatment) HPI Comments: Patient brought to the hospital by EMS after home health nurse found the patient to be profoundly orthostatic. Patient has a competition recently past medical history. She underwent a nephrectomy on November 24 secondary to carcinoma. She had repeat hospitalization several days after discharge and stayed until 2 days ago. Since going home she is progressively worsening generalized weakness. Has not been able to get up out of bed because every time she sits up or moves she gets profoundly dizzy. She reports she has not been eating or drinking much, but has not had any nausea, vomiting or diarrhea. Patient continues to complain of pain from the surgery.  Patient is a 72 y.o. female presenting with weakness.  Weakness Associated symptoms include abdominal pain.    Past Medical History  Diagnosis Date  . Depression   . Back pain   . Lung cancer     s/p resection  . COPD (chronic obstructive pulmonary disease)   . Headache(784.0)     HX MIGRAINES   . GERD (gastroesophageal reflux disease)   . Renal cell cancer Left  . Frequency of urination   . Urgency of urination   . Nocturia   . Hernia     L LOWER ABD  . Difficulty sleeping    Past Surgical History  Procedure Laterality Date  . Lung surgery  1985    RT UPPER LOBE REMOVED FOR CANCER - NOT FRUTHER TX  . Breast surgery  1988    AUGMENTATION  . Back surgery  1985  . Abdominal hysterectomy  1970  . Hand surgery      LEFT  . Robot assisted laparoscopic nephrectomy Left 09/15/2013    Procedure: ROBOTIC ASSISTED LAPAROSCOPIC NEPHRECTOMY;  Surgeon: Sebastian Ache, MD;  Location: [facility];  Service: Urology;  Laterality: Left;    Family History  Problem Relation Age of Onset  . Cancer Mother     Melanoma  . Heart disease Father   . Stroke Father   . Cancer Maternal Grandmother     Colon cancer  . Cancer Brother     Colon cancer  . Cancer Sister     Adrenal gland cancer   History  Substance Use Topics  . Smoking status: Current Every Day Smoker -- 0.50 packs/day for 54 years    Types: Cigarettes  . Smokeless tobacco: Current User  . Alcohol Use: No   OB History   Grav Para Term Preterm Abortions TAB SAB Ect Mult Living                 Review of Systems  Gastrointestinal: Positive for abdominal pain and constipation.  Neurological: Positive for dizziness and weakness.  All other systems reviewed and are negative.    Allergies  Penicillins and Codeine  Home Medications   Current Outpatient Rx  Name  Route  Sig  Dispense  Refill  . ALPRAZolam (XANAX) 0.25 MG tablet   Oral   Take 0.25 mg by mouth 2 (two) times daily as needed for anxiety or sleep.          . ciprofloxacin (CIPRO) 500 MG tablet   Oral   Take 1 tablet (500 mg total) by mouth 2 (two) times daily.  4 tablet   0   . omeprazole (PRILOSEC) 40 MG capsule   Oral   Take 40 mg by mouth daily.         Marland Kitchen oxyCODONE-acetaminophen (PERCOCET/ROXICET) 5-325 MG per tablet   Oral   Take 1 tablet by mouth every 4 (four) hours as needed for severe pain. Postoperatively.   40 tablet   0   . PARoxetine (PAXIL) 20 MG tablet   Oral   Take 20 mg by mouth every evening.         . polyethylene glycol (MIRALAX / GLYCOLAX) packet   Oral   Take 17 g by mouth daily.         Marland Kitchen senna-docusate (SENOKOT-S) 8.6-50 MG per tablet   Oral   Take 1 tablet by mouth 2 (two) times daily. While taking pain meds to prevent constipation.   30 tablet   1    BP 91/59  Pulse 101  Temp(Src) 97.4 F (36.3 C) (Oral)  Resp 17  SpO2 96% Physical Exam  Constitutional: She is oriented to person, place, and time. She appears well-developed and  well-nourished. No distress.  HENT:  Head: Normocephalic and atraumatic.  Right Ear: Hearing normal.  Left Ear: Hearing normal.  Nose: Nose normal.  Mouth/Throat: Oropharynx is clear and moist and mucous membranes are normal.  Eyes: Conjunctivae and EOM are normal. Pupils are equal, round, and reactive to light.  Neck: Normal range of motion. Neck supple.  Cardiovascular: Regular rhythm, S1 normal and S2 normal.  Exam reveals no gallop and no friction rub.   No murmur heard. Pulmonary/Chest: Effort normal and breath sounds normal. No respiratory distress. She exhibits no tenderness.  Abdominal: Soft. Normal appearance and bowel sounds are normal. There is no hepatosplenomegaly. There is generalized tenderness. There is no rebound, no guarding, no tenderness at McBurney's point and negative Murphy's sign. No hernia.  Musculoskeletal: Normal range of motion.  Neurological: She is alert and oriented to person, place, and time. She has normal strength. No cranial nerve deficit or sensory deficit. Coordination normal. GCS eye subscore is 4. GCS verbal subscore is 5. GCS motor subscore is 6.  Skin: Skin is warm, dry and intact. No rash noted. No cyanosis.  Healing trocar sites, no erythema or drainage  Psychiatric: She has a normal mood and affect. Her speech is normal and behavior is normal. Thought content normal.    ED Course  Procedures (including critical care time) Labs Review Labs Reviewed  CBC WITH DIFFERENTIAL - Abnormal; Notable for the following:    WBC 11.5 (*)    Hemoglobin 11.1 (*)    HCT 35.3 (*)    MCH 25.2 (*)    RDW 15.9 (*)    All other components within normal limits  COMPREHENSIVE METABOLIC PANEL - Abnormal; Notable for the following:    Sodium 129 (*)    Chloride 94 (*)    Glucose, Bld 155 (*)    Creatinine, Ser 1.20 (*)    Albumin 3.4 (*)    GFR calc non Af Amer 43 (*)    GFR calc Af Amer 50 (*)    All other components within normal limits  TROPONIN I   URINALYSIS, ROUTINE W REFLEX MICROSCOPIC   Imaging Review Dg Abd Acute W/chest  10/03/2013   CLINICAL DATA:  Status post left nephrectomy on 09/15/2013, weakness, orthostatic hypotension, abdominal discomfort  EXAM: ACUTE ABDOMEN SERIES (ABDOMEN 2 VIEW & CHEST 1 VIEW)  COMPARISON:  CT chest dated 09/29/2013.  CT abdomen pelvis dated 09/29/2013.  FINDINGS: Nodularity in the lateral right mid lung, corresponding to suspected metastases on CT. Additional scattered left lung nodules are not well visualized radiographically. No pleural effusion or pneumothorax.  The heart is normal in size. Surgical clips in the right perihilar region.  Bilateral breast augmentation.  Nonobstructive bowel gas pattern.  Surgical clips in the left mid abdomen related to prior nephrectomy.  No evidence of free air on the lateral decubitus view.  Mild degenerative changes of the visualized thoracolumbar spine.  IMPRESSION: Pulmonary nodularity in the right mid lung, better visualized on prior CT. Additional known left lung nodules are not well visualized radiographically.  No evidence of small bowel obstruction or free air.   Electronically Signed   By: Charline Bills M.D.   On: 10/03/2013 16:05    EKG Interpretation    Date/Time:  Friday October 03 2013 15:05:46 EST Ventricular Rate:  89 PR Interval:  132 QRS Duration: 90 QT Interval:  371 QTC Calculation: 451 R Axis:   80 Text Interpretation:  Sinus rhythm Minimal ST elevation, inferior leads No significant change since last tracing Confirmed by POLLINA  MD, CHRISTOPHER ([id number]) on 10/03/2013 4:27:19 PM            MDM  Diagnoses: Orthostatic hypotension  Present to the ER for evaluation of orthostatic hypotension. Patient has had complex recent past medical history. She has been out of the hospital for 2 days and reports poor by mouth intake. She has no significant dizziness and feeling like she is going to pass out when she tries to get out of bed. Home  health nurse documented significant orthostasis earlier today. Blood work was unremarkable. Blood pressure after a liter of fluid is now normotensive at rest with normal heart rate, but she does still significantly drop her blood pressure with sitting and standing. She will require hospitalization for further management.    Gilda Crease, MD 10/03/13 [phone]

## 2013-10-03 NOTE — H&P (Signed)
[facility]  History and Physical  Kaylee [name] UEA:[id number] DOB: 18-Jan-1941 DOA: 10/03/2013  Referring physician: EDP PCP: Kaylee Pounds, MD  Outpatient Specialists:  1. Urology: Dr. Berneice [name] 2. Oncology: Dr. Bertis [name]  Chief Complaint: Dizziness and upright position.  HPI: Kaylee [name] is a 72 y.o. female was recently discharged from the hospital on 10/01/13 and returns on 10/03/13 due to complaints of dizziness in the upright position, anorexia and generalized weakness. Patient has history of recently diagnosed renal cell carcinoma/renal vein involvement, status post left radical nephrectomy on 09/15/13, depression, anxiety, COPD, DM 2. During recent hospitalization between 12/6-12/10, she presented with left flank pain, presyncope, anorexia and polyuria. She was found to have enlarging pulmonary and hepatic metastasis along with UTI. She underwent a liver biopsy which confirmed metastasis to the liver. MRI brain was negative for metastasis. She was discharged with outpatient followup with oncology and urology. Since discharge, patient has continued to have anorexia but denies nausea, vomiting or diarrhea. No fevers. Occasional chills. She complains of dizziness in the upright position but denies syncope or falls. Her sister Ms. Kaylee [name] has been staying with her most of the time since hospital discharge. She complains of some abdominal pain only on coughing or movement attempts such as getting up from bed. Today when home health PT went to evaluate her, they found her to be orthostatic, PCPs office was contacted and patient was advised to come to the ED for further management. In the ED, orthostatic hypotension was redemonstrated, sodium 129, chloride 94, glucose 155, creatinine 1.2, albumin 3.4, hemoglobin 11.1 and acute abdominal series did not show acute findings. Hospitalist admission was requested.   Review of Systems: All systems reviewed and apart from history of  presenting illness, are negative.  Past Medical History  Diagnosis Date  . Depression   . Back pain   . Lung cancer     s/p resection  . COPD (chronic obstructive pulmonary disease)   . Headache(784.0)     HX MIGRAINES   . GERD (gastroesophageal reflux disease)   . Renal cell cancer Left  . Frequency of urination   . Urgency of urination   . Nocturia   . Hernia     L LOWER ABD  . Difficulty sleeping    Past Surgical History  Procedure Laterality Date  . Lung surgery  1985    RT UPPER LOBE REMOVED FOR CANCER - NOT FRUTHER TX  . Breast surgery  1988    AUGMENTATION  . Back surgery  1985  . Abdominal hysterectomy  1970  . Hand surgery      LEFT  . Robot assisted laparoscopic nephrectomy Left 09/15/2013    Procedure: ROBOTIC ASSISTED LAPAROSCOPIC NEPHRECTOMY;  Surgeon: Kaylee Ache, MD;  Location: [facility];  Service: Urology;  Laterality: Left;   Social History:  reports that she has been smoking Cigarettes.  She has a 27 pack-year smoking history. She uses smokeless tobacco. She reports that she does not drink alcohol or use illicit drugs.   Allergies  Allergen Reactions  . Penicillins     unknown  . Codeine Swelling and Rash    Family History  Problem Relation Age of Onset  . Cancer Mother     Melanoma  . Heart disease Father   . Stroke Father   . Cancer Maternal Grandmother     Colon cancer  . Cancer Brother     Colon cancer  . Cancer Sister     Adrenal gland cancer  Prior to Admission medications   Medication Sig Start Date End Date Taking? Authorizing Provider  ALPRAZolam (XANAX) 0.25 MG tablet Take 0.25 mg by mouth 2 (two) times daily as needed for anxiety or sleep.    Yes Historical Provider, MD  ciprofloxacin (CIPRO) 500 MG tablet Take 500 mg by mouth 2 (two) times daily. 09/30/13  Yes Kaylee P Rama, MD  omeprazole (PRILOSEC) 40 MG capsule Take 40 mg by mouth daily.   Yes Historical Provider, MD  oxyCODONE-acetaminophen (PERCOCET/ROXICET) 5-325 MG  per tablet Take 1 tablet by mouth every 4 (four) hours as needed for severe pain. Postoperatively. 09/17/13  Yes Kaylee Ache, MD  PARoxetine (PAXIL) 20 MG tablet Take 20 mg by mouth every evening.   Yes Historical Provider, MD  polyethylene glycol (MIRALAX / GLYCOLAX) packet Take 17 g by mouth daily.   Yes Historical Provider, MD  senna-docusate (SENOKOT-S) 8.6-50 MG per tablet Take 1 tablet by mouth 2 (two) times daily. While taking pain meds to prevent constipation. 09/17/13  Yes Kaylee Ache, MD   Physical Exam: Filed Vitals:   10/03/13 1448 10/03/13 1730 10/03/13 1731 10/03/13 1732  BP: 91/59 114/61 94/62 83/52   Pulse: 101 88 96 105  Temp: 97.4 F (36.3 C)     TempSrc: Oral     Resp: 17     SpO2: 96%      Patient was examined with a female nurse and her 2 sisters in the room.  General exam: Moderately built and frail female patient, lying comfortably supine on the gurney in no obvious distress.  Head, eyes and ENT: Nontraumatic and normocephalic. Pupils equally reacting to light and accommodation. Oral mucosa slightly dry.  Neck: Supple. No JVD, carotid bruit or thyromegaly.  Lymphatics: No lymphadenopathy.  Respiratory system: Clear to auscultation. No increased work of breathing. Liver biopsy site underneath the right breast clean and intact.  Cardiovascular system: S1 and S2 heard, RRR. No JVD, murmurs, gallops, clicks or pedal edema.  Gastrointestinal system: Abdomen is nondistended, soft and mild tenderness, mostly at sites of recent surgery but without any other acute findings. No rigidity, guarding or rebound. Surgical sites seem to be healing well.. Normal bowel sounds heard. No organomegaly or masses appreciated.  Central nervous system: Alert and oriented. No focal neurological deficits.  Extremities: Symmetric 5 x 5 power. Peripheral pulses symmetrically felt.   Skin: No rashes or acute findings.  Musculoskeletal system: Negative exam.  Psychiatry: Pleasant  and cooperative.   Labs on Admission:  Basic Metabolic Panel:  Recent Labs Lab 09/27/13 1232 09/27/13 1254 10/03/13 1505  NA 137  --  129*  K 4.5  --  4.3  CL 103  --  94*  CO2  --   --  22  GLUCOSE 117*  --  155*  BUN 17  --  22  CREATININE 1.10 0.95 1.20*  CALCIUM  --   --  9.6   Liver Function Tests:  Recent Labs Lab 09/27/13 1256 10/03/13 1505  AST 17 13  ALT 10 8  ALKPHOS 99 112  BILITOT 0.3 0.4  PROT 7.5 8.2  ALBUMIN 3.1* 3.4*    Recent Labs Lab 09/27/13 1256  LIPASE 16   No results found for this basename: AMMONIA,  in the last 168 hours CBC:  Recent Labs Lab 09/27/13 1225 09/27/13 1232 10/03/13 1505  WBC 11.6*  --  11.5*  NEUTROABS  --   --  7.7  HGB 10.2* 10.9* 11.1*  HCT 31.7* 32.0* 35.3*  MCV 80.5  --  80.2  PLT 440*  --  353   Cardiac Enzymes:  Recent Labs Lab 10/03/13 1505  TROPONINI <0.30    BNP (last 3 results) No results found for this basename: PROBNP,  in the last 8760 hours CBG:  Recent Labs Lab 09/27/13 1238  GLUCAP 108*    Radiological Exams on Admission: Dg Abd Acute W/chest  10/03/2013   CLINICAL DATA:  Status post left nephrectomy on 09/15/2013, weakness, orthostatic hypotension, abdominal discomfort  EXAM: ACUTE ABDOMEN SERIES (ABDOMEN 2 VIEW & CHEST 1 VIEW)  COMPARISON:  CT chest dated 09/29/2013. CT abdomen pelvis dated 09/29/2013.  FINDINGS: Nodularity in the lateral right mid lung, corresponding to suspected metastases on CT. Additional scattered left lung nodules are not well visualized radiographically. No pleural effusion or pneumothorax.  The heart is normal in size. Surgical clips in the right perihilar region.  Bilateral breast augmentation.  Nonobstructive bowel gas pattern.  Surgical clips in the left mid abdomen related to prior nephrectomy.  No evidence of free air on the lateral decubitus view.  Mild degenerative changes of the visualized thoracolumbar spine.  IMPRESSION: Pulmonary nodularity in the  right mid lung, better visualized on prior CT. Additional known left lung nodules are not well visualized radiographically.  No evidence of small bowel obstruction or free air.   Electronically Signed   By: Charline Bills M.D.   On: 10/03/2013 16:05    EKG: Independently reviewed. Sinus rhythm, normal axis without acute changes.  Assessment/Plan Principal Problem:   Orthostatic hypotension Active Problems:   DIABETES MELLITUS, TYPE II   HYPERLIPIDEMIA   Depressive disorder, not elsewhere classified   GERD   BACKACHE NOS   Urinary tract infection   Renal cell carcinoma   Dizziness   Anorexia   Abdominal pain   Normocytic anemia   Protein-calorie malnutrition, severe   Dehydration with hyponatremia   Orthostatic hypotension - DD: Dehydration, deconditioning versus other etiology-? Paraneoplastic manifestation and autonomic dysfunction - Admit to telemetry. Briefly hydrate with IV fluids and place bilateral lower extremity compression stockings - Monitor closely. - Patient and family counseled extensively regarding maneuvers to minimize symptoms from orthostatic hypotension such as gradual movement from laying to sitting and sitting to standing. He verbalized understanding.  Dehydration with hyponatremia - Briefly hydrate with IV normal saline. - If sodium does not correct despite IV hydration, other possibility could be malignancy related SIADH.  Chronic anemia - Stable. Follow CBC  Recently diagnosed UTI - Complete course of oral Cipro.  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 As per patient and family, she wasn't supposed to start chemotherapy for a couple of weeks post surgery. - Patient's abdominal pain is appropriate from recent surgery. No acute abdominal findings.  Anxiety and depression - Continue home medications.  Anorexia/severe malnutrition in the context of chronic illness - Nutritionist had  seen patient during recent hospitalization. Continue ensure 3 times a day.  COPD - Stable. Tobacco cessation counseled.  Type II DM - Patient denies. Check CBGs.     Code Status: Full  Family Communication: Discussed with patient's 2 sisters at bedside.  Disposition Plan: Home when medically stable.   Time spent: 55 minutes  Loring Liskey, MD, FACP, FHM. [facility] Pager [phone]  If 7PM-7AM, please contact night-coverage [email or web address] Password [facility] 10/03/2013, 6:33 PM

## 2013-10-04 DIAGNOSIS — I959 Hypotension, unspecified: Secondary | ICD-10-CM

## 2013-10-04 LAB — URINE MICROSCOPIC-ADD ON

## 2013-10-04 LAB — CBC
HCT: 30.3 % — ABNORMAL LOW (ref 36.0–46.0)
MCHC: 32 g/dL (ref 30.0–36.0)
Platelets: 312 10*3/uL (ref 150–400)
RBC: 3.76 MIL/uL — ABNORMAL LOW (ref 3.87–5.11)
RDW: 16 % — ABNORMAL HIGH (ref 11.5–15.5)
WBC: 9.8 10*3/uL (ref 4.0–10.5)

## 2013-10-04 LAB — GLUCOSE, CAPILLARY
Glucose-Capillary: 135 mg/dL — ABNORMAL HIGH (ref 70–99)
Glucose-Capillary: 133 mg/dL — ABNORMAL HIGH (ref 70–99)

## 2013-10-04 LAB — BASIC METABOLIC PANEL
BUN: 20 mg/dL (ref 6–23)
Chloride: 104 mEq/L (ref 96–112)
Creatinine, Ser: 1 mg/dL (ref 0.50–1.10)
GFR calc Af Amer: 63 mL/min — ABNORMAL LOW (ref >90)
GFR calc non Af Amer: 54 mL/min — ABNORMAL LOW (ref >90)
Sodium: 134 mEq/L — ABNORMAL LOW (ref 135–145)

## 2013-10-04 LAB — URINALYSIS, ROUTINE W REFLEX MICROSCOPIC
Bilirubin Urine: NEGATIVE
Glucose, UA: NEGATIVE mg/dL
Ketones, ur: NEGATIVE mg/dL
Nitrite: NEGATIVE
Urobilinogen, UA: 1 mg/dL (ref 0.0–1.0)
pH: 6.5 (ref 5.0–8.0)

## 2013-10-04 LAB — TROPONIN I: Troponin I: <0.3 ng/mL (ref <0.30)

## 2013-10-04 MED ORDER — SODIUM CHLORIDE 0.9 % IV SOLN: INTRAVENOUS | Status: AC

## 2013-10-04 MED ORDER — FLUDROCORTISONE ACETATE 0.1 MG PO TABS
0.1000 mg | ORAL_TABLET | Freq: Two times a day (BID) | ORAL | Status: DC
  Administered 2013-10-05 – 2013-10-07 (×6): 0.1 mg via ORAL
  Filled 2013-10-04 (×7): qty 1

## 2013-10-04 MED ORDER — SODIUM CHLORIDE 1 G PO TABS
1.0000 g | ORAL_TABLET | Freq: Two times a day (BID) | ORAL | Status: DC
  Administered 2013-10-04: 1 g via ORAL
  Filled 2013-10-04 (×4): qty 1

## 2013-10-04 NOTE — Care Management Note (Signed)
UR complete    Jamyria Ozanich,MSN,RN [phone] 

## 2013-10-04 NOTE — Progress Notes (Signed)
Jream Broyles Poupard ZOX:[id number] DOB: 1941-04-12 DOA: 10/03/2013 PCP: Gwen Pounds, MD  Brief narrative: 72 y.o. 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   During recent hospitalization between 12/6-12/10, presented with left flank pain, presyncope, anorexia and polyuria. She was found to have enlarging pulmonary and hepatic metastasis along with UTI.  Liver biopsy which confirmed metastasis to the liver. MRI brain was negative for metastasis. She was discharged with outpatient followup with oncology and urology ED=orthostatic hypotension was redemonstrated, sodium 129, chloride 94, glucose 155, creatinine 1.2, albumin 3.4, hemoglobin 11.1 and acute abdominal series did not show acute findings.  Past medical history-As per Problem list Chart reviewed as below- 11/16/12=Robot assist Lap-Nephrectomy [L] Known Dysphagia/Gerd-Sees Dr. Jannette Fogo 9/11/9 H/x Lung Ca 1985, type,staging unknown-Lobectomy RU Chronic ddd Hernia followed by Dr. Harlon Flor   Consultants:  None as yet-Dr. Bertis Ruddy will be informed on return 10/06/13  Procedures:  Acute abd series 12/12  Antibiotics:  Completed prior Cipro 10/03/13   Subjective  Feels better with IVF.  States her dizzy spells got a lot worse after Nephrectomy Not really eating much.  This seems to be chronic No Sob, CP,,N Doesn't wish anymore of the laxative    Objective    Interim History: None  Telemetry: NSR  Objective: Filed Vitals:   10/04/13 0500 10/04/13 0536 10/04/13 0538 10/04/13 0541  BP:  119/66 103/64 90/57  Pulse:  78 87 94  Temp:  98.4 F (36.9 C)    TempSrc:  Oral    Resp:  18    Height:      Weight: 55.2 kg (121 lb  11.1 oz)     SpO2:  96%      Intake/Output Summary (Last 24 hours) at 10/04/13 1232 Last data filed at 10/04/13 0900  Gross per 24 hour  Intake    240 ml  Output    426 ml  Net   -186 ml    Exam:  General: EOMI, NCAT Respiratory-clear, no added sound Abdomen: soft, Nt/ND Neuro nad  Data Reviewed: Basic Metabolic Panel:  Recent Labs Lab 09/27/13 1254 10/03/13 1505 10/04/13 0510  NA  --  129* 134*  K  --  4.3 4.3  CL  --  94* 104  CO2  --  22 23  GLUCOSE  --  155* 113*  BUN  --  22 20  CREATININE 0.95 1.20* 1.00  CALCIUM  --  9.6 8.6   Liver Function Tests:  Recent Labs Lab 09/27/13 1256 10/03/13 1505  AST 17 13  ALT 10 8  ALKPHOS 99 112  BILITOT 0.3 0.4  PROT 7.5 8.2  ALBUMIN 3.1* 3.4*    Recent Labs Lab 09/27/13 1256  LIPASE 16   No results found for this basename: AMMONIA,  in the last 168 hours CBC:  Recent Labs Lab 10/03/13 1505 10/04/13 0510  WBC 11.5* 9.8  NEUTROABS 7.7  --   HGB 11.1* 9.7*  HCT 35.3* 30.3*  MCV 80.2 80.6  PLT 353 312   Cardiac Enzymes:  Recent Labs Lab 10/03/13 1505  TROPONINI <0.30   BNP: No components found with this basename: POCBNP,  CBG:  Recent Labs Lab 09/27/13 1238  GLUCAP 108*    No results found for  this or any previous visit (from the past 240 hour(s)).   Studies:              All Imaging reviewed and is as per above notation   Scheduled Meds: . enoxaparin (LOVENOX) injection  40 mg Subcutaneous Q24H  . pantoprazole  80 mg Oral Daily  . PARoxetine  20 mg Oral QPM  . polyethylene glycol  17 g Oral Daily  . senna-docusate  1 tablet Oral BID  . sodium chloride  3 mL Intravenous Q12H   Continuous Infusions:    Assessment/Plan: 1. Symptomatic orthostatic hypotension-liberalize diet, added salt tablets 2 g twice a day, add Florinef 0.1 twice a day.  Will monitor. Unlikely SIADH currently as the rest of the patient's electrolytes are within normal range, and the sodium deficit has been  corrected. We'll continue IV saline 75 cc per hour for another 24 hours 2. Mild renal insufficiency/AKI on admission-Repleted with Iv saline.  Secondary to poor by mouth intake and salt wasting from hypovolemic hyponatremia  3. Recent stage IV metastatic renal cell carcinoma resection- ECOG 1-will discuss with both urology and oncology on 12/15 4. 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 Iatrogenic diarrhea, not infectious-hold on MiraLax and senna 6. Depression-continue Paxil 20 every afternoon, Xanax 0.25 twice a day when necessary 7. Urgency and frequency of urination-finished course of by mouth ciprofloxacin 12/12.  No further reason/rationale for current treatment 8. Severe protein energy malnutrition Body mass index is 19.06 kg/(m^2).-reinforced diet to improve ECoG status 9. Stage II gold COPD-no need for inhalers currently 10. Impaired glucose tolerance-would not aggressively monitor unless a.m. blood sugars above 200  Code Status: Full  Family Communication: None available currently-will update in a.m.  Disposition Plan: Inpatient   Pleas Koch, MD  [facility] Pager [phone] 10/04/2013, 12:32 PM    LOS: 1 day

## 2013-10-04 NOTE — Progress Notes (Addendum)
10/04/2013 1400 NCM spoke to pt and she is active with [facility]. Will need resumption on care orders from attending. Pt may benefit from [facility] RN to monitor BP and meds. Will notify MD. Pt lives alone but her sister stays with her quite often. Pt states she does need RW for [facility] and order was placed. She is not sure who is suppose to deliver. Explained NCM will follow up with [employer]. Contacted [employer] and they received request on 12/10. Explained RW will need to be delivered to hospital. Faxed orders to [facility] for RW. Requested RW delivery to hospital today.  Isidoro Donning RN CCM Case Mgmt phone [phone]

## 2013-10-04 NOTE — Evaluation (Signed)
Physical Therapy Evaluation Patient Details Name: Kaylee [name] MRN: [id number] DOB: 1941/08/07 Today's Date: 10/04/2013 Time: 1250-1330 PT Time Calculation (min): 40 min  PT Assessment / Plan / Recommendation History of Present Illness   Kaylee [name] is a 72 y.o. female was recently discharged from the hospital on 10/01/13 and returns on 10/03/13 due to complaints of dizziness in the upright position, anorexia and generalized weakness. 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 Her sister [name] has been staying with her most of the time since hospital discharge. When home health PT went to evaluate her, they found her to be orthostatic, PCPs office was contacted and patient was advised to come to the ED for further management. In the ED, orthostatic hypotension was redemonstrated, sodium 129, chloride 94,   Clinical Impression  Pt presents with decreased balance, decreased tolerance to upright, urgency for BM(n BSC x 3 in 30") Pt had  Dizziness and loss of balance during transfers. Pt reports a h/o vertigo many years ago. Pt will benefit from PT to address problems listed. Pt has limited caregiver support. Recommend SNF, pt agreeable.  PT Assessment  Patient needs continued PT services    Follow Up Recommendations  SNF    Does the patient have the potential to tolerate intense rehabilitation      Barriers to Discharge Decreased caregiver support      Equipment Recommendations  Rolling walker with 5" wheels    Recommendations for Other Services     Frequency Min 3X/week    Precautions / Restrictions Precautions Precautions: Fall Precaution Comments: orthostatic per chart   Pertinent Vitals/Pain Supine 123/65 Sitting 105/69  standing 122/64 R arm w/ small cuff.      Mobility  Bed Mobility Supine to Sit: 5:  Supervision Sit to Supine: 5: Supervision Details for Bed Mobility Assistance: encouraged pt to sit EOB for several minutes before standing up Transfers Transfers: Stand Pivot Transfers Sit to Stand: 3: Mod assist;From bed;From chair/3-in-1;With upper extremity assist Stand to Sit: To chair/3-in-1;To bed;3: Mod assist Stand Pivot Transfers: 3: Mod assist Details for Transfer Assistance: close guard for safety. unsteady. Pt moves quickly which enhances unsteadiness/ Pt transferred to [facility] x 2 during PT eval due to BM urgency/incontinence. Pt lost balance x 1 when standing for BP, pt stated ashe felt like she was swimmiy headed. Ambulation/Gait Ambulation/Gait Assistance: Not tested (comment)    Exercises     PT Diagnosis: Difficulty walking;Generalized weakness  PT Problem List:   PT Treatment Interventions: DME instruction;Gait training;Functional mobility training;Therapeutic activities;Therapeutic exercise;Patient/family education     PT Goals(Current goals can be found in the care plan section) Acute Rehab PT Goals Patient Stated Goal: I need to go to rehab. I PT Goal Formulation: With patient Time For Goal Achievement: 10/18/13 Potential to Achieve Goals: Good  Visit Information  Last PT Received On: 10/04/13 Assistance Needed: +2 (to walk.) History of Present Illness:  Kaylee [name] is a 72 y.o. female was recently discharged from the hospital on 10/01/13 and returns on 10/03/13 due to complaints of dizziness in the upright position, anorexia and generalized weakness. 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 Her sister [name] has been staying with her most of the time since hospital discharge. When home health PT went to evaluate  her, they found her to be orthostatic, PCPs office was contacted and patient was advised to come to  the ED for further management. In the ED, orthostatic hypotension was redemonstrated, sodium 129, chloride 94,        Prior Functioning  Home Living Family/patient expects to be discharged to:: Private residence Living Arrangements: Other relatives Available Help at Discharge: Family Type of Home: House Home Access: Stairs to enter Secretary/administrator of Steps: 1 Home Layout: One level Home Equipment: None Additional Comments: was to get a RW but has not yet. Prior Function Level of Independence: Needs assistance Gait / Transfers Assistance Needed: since DC on12/10, pt has requiored assistance to get up to bathroom. Verylimited activity.    Cognition       Extremity/Trunk Assessment     Holiday representative Standing - Balance Support: During functional activity Static Standing - Level of Assistance: 3: Mod assist Static Standing - Comment/# of Minutes: pt stands with fleexed knees. requires UEsupport for stability.  End of Session PT - End of Session Activity Tolerance: Patient limited by fatigue;Treatment limited secondary to medical complications (Comment) Patient left: in bed;with call bell/phone within reach Nurse Communication: Mobility status  GP Functional Assessment Tool Used: clinical judgement Functional Limitation: Mobility: Walking and moving around Mobility: Walking and Moving Around Current Status (R6045): At least 60 percent but less than 80 percent impaired, limited or restricted Mobility: Walking and Moving Around Goal Status [phone]): 0 percent impaired, limited or restricted   Rada Hay 10/04/2013, 2:36 PM Blanchard Kelch PT [phone]

## 2013-10-05 DIAGNOSIS — K922 Gastrointestinal hemorrhage, unspecified: Secondary | ICD-10-CM | POA: Diagnosis present

## 2013-10-05 DIAGNOSIS — E43 Unspecified severe protein-calorie malnutrition: Secondary | ICD-10-CM | POA: Diagnosis present

## 2013-10-05 LAB — COMPREHENSIVE METABOLIC PANEL
AST: 12 U/L (ref 0–37)
Albumin: 2.8 g/dL — ABNORMAL LOW (ref 3.5–5.2)
CO2: 23 mEq/L (ref 19–32)
Calcium: 8.7 mg/dL (ref 8.4–10.5)
Creatinine, Ser: 0.94 mg/dL (ref 0.50–1.10)
GFR calc Af Amer: 68 mL/min — ABNORMAL LOW (ref >90)
GFR calc non Af Amer: 58 mL/min — ABNORMAL LOW (ref >90)
Glucose, Bld: 160 mg/dL — ABNORMAL HIGH (ref 70–99)

## 2013-10-05 LAB — CBC
Hemoglobin: 9.5 g/dL — ABNORMAL LOW (ref 12.0–15.0)
MCH: 26 pg (ref 26.0–34.0)
MCHC: 32.5 g/dL (ref 30.0–36.0)
MCV: 80 fL (ref 78.0–100.0)
Platelets: 301 10*3/uL (ref 150–400)
RBC: 3.65 MIL/uL — ABNORMAL LOW (ref 3.87–5.11)

## 2013-10-05 LAB — GLUCOSE, CAPILLARY
Glucose-Capillary: 119 mg/dL — ABNORMAL HIGH (ref 70–99)
Glucose-Capillary: 206 mg/dL — ABNORMAL HIGH (ref 70–99)
Glucose-Capillary: 148 mg/dL — ABNORMAL HIGH (ref 70–99)

## 2013-10-05 MED ORDER — ENSURE COMPLETE PO LIQD
237.0000 mL | Freq: Three times a day (TID) | ORAL | Status: DC
  Administered 2013-10-05 – 2013-10-10 (×14): 237 mL via ORAL
  Filled 2013-10-05: qty 237

## 2013-10-05 MED ORDER — MIDODRINE HCL 2.5 MG PO TABS
2.5000 mg | ORAL_TABLET | Freq: Three times a day (TID) | ORAL | Status: DC
  Administered 2013-10-05 – 2013-10-07 (×5): 2.5 mg via ORAL
  Filled 2013-10-05 (×8): qty 1

## 2013-10-05 NOTE — Progress Notes (Signed)
INITIAL NUTRITION ASSESSMENT  DOCUMENTATION CODES Per approved criteria  -Severe malnutrition in the context of chronic illness  Pt meets criteria for severe MALNUTRITION in the context of chronic illness as evidenced by wt loss of 14% in 2 months and reported intake meeting <75% of estimated needs for >1 month.  INTERVENTION: Ensure Complete po TID, each supplement provides 350 kcal and 13 grams of protein  NUTRITION DIAGNOSIS: Inadequate oral intake related to liver cancer as evidenced by wt loss and reported intake less than estimated needs.   Goal: Pt to meet >/= 90% of their estimated nutrition needs   Monitor:  Wt, po intake, acceptance of supplements, labs  Reason for Assessment: MST  72 y.o. female  Admitting Dx: Orthostatic hypotension  ASSESSMENT: 72 y.o. 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   Pt reports that her usual body weight was 149-155 lbs about 2 years ago, but that she weighed 139 lbs less than two months ago. Her current body weight is 120 lbs. She said that her appetite has been poor, and that she has been eating very little for the past several months. She drinks ensure once per day at home. RD recommended that pt drink Ensure at home 2-3 times each day.   Height: Ht Readings from Last 1 Encounters:  10/03/13 5\' 7"  (1.702 m)    Weight: Wt Readings from Last 1 Encounters:  10/05/13 120 lb 5.9 oz (54.6 kg)    Ideal Body Weight: 61.6 kg  % Ideal Body Weight: 89%  Wt Readings from Last 10 Encounters:  10/05/13 120 lb 5.9 oz (54.6 kg)  09/27/13 125 lb (56.7 kg)  09/15/13 137 lb 5.6 oz (62.3 kg)  09/15/13 137 lb 5.6 oz (62.3 kg)  09/09/13 128 lb 4 oz (58.174  kg)  09/03/13 129 lb 6.4 oz (58.695 kg)  04/20/10 130 lb (58.968 kg)  12/21/09 136 lb (61.689 kg)  12/22/08 134 lb 8 oz (61.009 kg)  08/06/08 133 lb (60.328 kg)    Usual Body Weight: 139 lbs two months ago  % Usual Body Weight: 86%  BMI:  Body mass index is 18.85 kg/(m^2).  Estimated Nutritional Needs: Kcal: 1400-1650 Protein: 65-75 g Fluid: >1.7 L  Skin: WNL  Diet Order: General  EDUCATION NEEDS: -Education needs addressed   Intake/Output Summary (Last 24 hours) at 10/05/13 1234 Last data filed at 10/05/13 1205  Gross per 24 hour  Intake    720 ml  Output   1254 ml  Net   -534 ml    Last BM: 12/14   Labs:   Recent Labs Lab 10/03/13 1505 10/04/13 0510 10/05/13 0536  NA 129* 134* 137  K 4.3 4.3 4.0  CL 94* 104 106  CO2 22 23 23   BUN 22 20 12   CREATININE 1.20* 1.00 0.94  CALCIUM 9.6 8.6 8.7  GLUCOSE 155* 113* 160*    CBG (last 3)   Recent Labs  10/04/13 2200 10/05/13 0810 10/05/13 1200  GLUCAP 113* 119* 122*    Scheduled Meds: . enoxaparin (LOVENOX) injection  40 mg Subcutaneous Q24H  . fludrocortisone  0.1 mg Oral BID  . PARoxetine  20 mg Oral QPM  . sodium chloride  3 mL Intravenous Q12H  . sodium chloride  1 g Oral BID WC    Continuous Infusions:   Past Medical History  Diagnosis Date  . Depression   . Back pain   . Lung cancer     s/p resection  . COPD (chronic obstructive pulmonary disease)   . Headache(784.0)     HX MIGRAINES   . GERD (gastroesophageal reflux disease)   . Renal cell cancer Left  . Frequency of urination   . Urgency of urination   . Nocturia   . Hernia     L LOWER ABD  . Difficulty sleeping     Past Surgical History  Procedure Laterality Date  . Lung surgery  1985    RT UPPER LOBE REMOVED FOR CANCER - NOT FRUTHER TX  . Breast surgery  1988    AUGMENTATION  . Back surgery  1985  . Abdominal hysterectomy  1970  . Hand surgery      LEFT  . Robot assisted laparoscopic nephrectomy Left 09/15/2013     Procedure: ROBOTIC ASSISTED LAPAROSCOPIC NEPHRECTOMY;  Surgeon: Sebastian Ache, MD;  Location: [facility];  Service: Urology;  Laterality: Left;    Ebbie Latus RD, LDN

## 2013-10-05 NOTE — Progress Notes (Signed)
Kaylee [name] ZOX:[id number] DOB: May 28, 1941 DOA: 10/03/2013 PCP: Gwen Pounds, MD  Brief narrative: 72 y.o. 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   During recent hospitalization between 12/6-12/10, presented with left flank pain, presyncope, anorexia and polyuria. She was found to have enlarging pulmonary and hepatic metastasis along with UTI.  Liver biopsy which confirmed metastasis to the liver. MRI brain was negative for metastasis. She was discharged with outpatient followup with oncology and urology ED=orthostatic hypotension was redemonstrated, sodium 129, chloride 94, glucose 155, creatinine 1.2, albumin 3.4, hemoglobin 11.1 and acute abdominal series did not show acute findings. She was still orthostatic even after admission and was placed on IV saline 75 cc per hour This was discontinued 12/14 however patient is noncompliant with sodium chloride tablets and Florinef was added to her medication.  Past medical history-As per Problem list Chart reviewed as below- 11/16/12=Robot assist Lap-Nephrectomy [L] Known Dysphagia/Gerd-Sees Dr. Jannette Fogo 9/11/9 H/x Lung Ca 1985, type,staging unknown-Lobectomy RU Chronic ddd Hernia followed by Dr. Harlon Flor   Consultants:  None as yet-Dr. Bertis Ruddy will be informed on return 10/06/13  Procedures:  Acute abd series 12/12  Antibiotics:  Completed prior Cipro 10/03/13   Subjective  Feels better with IVF.  Dizzy spells still present on sitting up but better able to manage them now and realizes she needs to sit for a couple of minutes for moving around   Objective    Interim History: None  Telemetry: NSR  Objective: Filed Vitals:    10/05/13 0518 10/05/13 0520 10/05/13 0522 10/05/13 0523  BP: 120/58 97/56 90/57  108/60  Pulse: 73 83 87 95  Temp: 98.2 F (36.8 C)     TempSrc: Oral     Resp: 18 18 19 20   Height:      Weight: 54.6 kg (120 lb 5.9 oz)     SpO2: 97% 100% 98% 97%    Intake/Output Summary (Last 24 hours) at 10/05/13 1030 Last data filed at 10/05/13 0855  Gross per 24 hour  Intake    720 ml  Output   1054 ml  Net   -334 ml    Exam:  General: EOMI, NCAT Respiratory-clear, no added sound Abdomen: soft, Nt/ND Neuro nad  Data Reviewed: Basic Metabolic Panel:  Recent Labs Lab 10/03/13 1505 10/04/13 0510 10/05/13 0536  NA 129* 134* 137  K 4.3 4.3 4.0  CL 94* 104 106  CO2 22 23 23   GLUCOSE 155* 113* 160*  BUN 22 20 12   CREATININE 1.20* 1.00 0.94  CALCIUM 9.6 8.6 8.7   Liver Function Tests:  Recent Labs Lab 10/03/13 1505 10/05/13 0536  AST 13 12  ALT 8 7  ALKPHOS 112 88  BILITOT 0.4 0.2*  PROT 8.2 6.5  ALBUMIN 3.4* 2.8*   No results found for this basename: LIPASE, AMYLASE,  in the last 168 hours No results found for this basename: AMMONIA,  in the last 168 hours CBC:  Recent Labs Lab 10/03/13 1505 10/04/13 0510 10/05/13 0536  WBC 11.5* 9.8 8.6  NEUTROABS 7.7  --   --   HGB 11.1* 9.7* 9.5*  HCT 35.3* 30.3* 29.2*  MCV 80.2 80.6 80.0  PLT 353 312 301   Cardiac Enzymes:  Recent Labs Lab 10/03/13  1505 10/04/13 1300  TROPONINI <0.30 <0.30   BNP: No components found with this basename: POCBNP,  CBG:  Recent Labs Lab 10/04/13 1250 10/04/13 1724 10/04/13 2200 10/05/13 0810  GLUCAP 135* 133* 113* 119*    Recent Results (from the past 240 hour(s))  URINE CULTURE     Status: None   Collection Time    10/04/13  3:28 AM      Result Value Range Status   Specimen Description URINE, CLEAN CATCH   Final   Special Requests NONE   Final   Culture  Setup Time     Final   Value: 10/04/2013 11:39     Performed at [facility] Count     Final    Value: >=100,000 COLONIES/ML     Performed at [facility]   Culture     Final   Value: ESCHERICHIA COLI     Performed at [facility]   Report Status PENDING   Incomplete     Studies:              All Imaging reviewed and is as per above notation   Scheduled Meds: . enoxaparin (LOVENOX) injection  40 mg Subcutaneous Q24H  . fludrocortisone  0.1 mg Oral BID  . PARoxetine  20 mg Oral QPM  . sodium chloride  3 mL Intravenous Q12H  . sodium chloride  1 g Oral BID WC   Continuous Infusions:    Assessment/Plan: 1. Symptomatic orthostatic hypotension-liberalize diet, cannot tolerate salt tablets  add Florinef 0.1 twice a day 12/13.  Will monitor. Unlikely SIADH currently as the rest of the patient's electrolytes are within normal range, and the sodium deficit has been corrected. Saline lock 12/14 2. Mild renal insufficiency/AKI on admission-Repleted with Iv saline.  Secondary to poor by mouth intake and salt wasting from hypovolemic hyponatremia  3. Recent stage IV metastatic renal cell carcinoma resection- ECOG 1-will discuss with both urology and oncology on 12/15 4. 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 Iatrogenic diarrhea, not infectious-hold on MiraLax and senna 6. Depression-continue Paxil 20 every afternoon, Xanax 0.25 twice a day when necessary-became anxious overnight 12/13 7. Urgency and frequency of urination-finished course of by mouth ciprofloxacin 12/12.  No further reason/rationale for current treatment 8. Severe protein energy malnutrition Body mass index is 18.85 kg/(m^2).-reinforced diet to improve ECoG status 9. Stage II gold COPD-no need for inhalers currently 10. Impaired glucose tolerance-would not aggressively monitor unless a.m. blood sugars above 200  Code Status: Full  Family Communication: Patient able to update her family fully-nurse in room throughout exam Disposition Plan: Inpatient   Pleas Koch, MD  [facility] Pager [phone] 10/05/2013, 10:30 AM    LOS: 2 days

## 2013-10-05 NOTE — Progress Notes (Signed)
Clinical Social Work Department CLINICAL SOCIAL WORK PLACEMENT NOTE 10/05/2013  Patient:  Kaylee [name], Kaylee [name]  Account Number:  [id number] Admit date:  10/03/2013  Clinical Social Worker:  Doroteo Glassman  Date/time:  10/05/2013 03:36 PM  Clinical Social Work is seeking post-discharge placement for this patient at the following level of care:   SKILLED NURSING   (*CSW will update this form in Epic as items are completed)   10/05/2013  Patient/family provided with [facility] Department of Clinical Social Work's list of facilities offering this level of care within the geographic area requested by the patient (or if unable, by the patient's family).  10/05/2013  Patient/family informed of their freedom to choose among providers that offer the needed level of care, that participate in Medicare, Medicaid or managed care program needed by the patient, have an available bed and are willing to accept the patient.  10/05/2013  Patient/family informed of [facility]' ownership interest in [facility], as well as of the fact that they are under no obligation to receive care at this facility.  PASARR submitted to EDS on 10/05/2013 PASARR number received from EDS on 10/05/2013  FL2 transmitted to all facilities in geographic area requested by pt/family on  10/05/2013 FL2 transmitted to all facilities within larger geographic area on   Patient informed that his/her managed care company has contracts with or will negotiate with  certain facilities, including the following:     Patient/family informed of bed offers received:   Patient chooses bed at  Physician recommends and patient chooses bed at    Patient to be transferred to  on   Patient to be transferred to facility by   The following physician request were entered in Epic:   Additional Comments:  Providence Crosby, Theresia Majors Clinical Social Work [phone]

## 2013-10-05 NOTE — Progress Notes (Signed)
Clinical Social Work Department BRIEF PSYCHOSOCIAL ASSESSMENT 10/05/2013  Patient:  Kaylee [name], Kaylee [name]     Account Number:  [id number]     Admit date:  10/03/2013  Clinical Social Worker:  Doroteo Glassman  Date/Time:  10/05/2013 03:33 PM  Referred by:  Physician  Date Referred:  10/05/2013 Referred for  SNF Placement   Other Referral:   Interview type:  Patient Other interview type:   Pt's sisters at bedside    PSYCHOSOCIAL DATA Living Status:  ALONE Admitted from facility:   Level of care:   Primary support name:  Kaylee [name] Primary support relationship to patient:  SIBLING Degree of support available:   strong    CURRENT CONCERNS Current Concerns  Post-Acute Placement   Other Concerns:    SOCIAL WORK ASSESSMENT / PLAN Met with Pt and sisters at bedside.  Pt has a strong support system.    Pt reported that, although she lives at home, her sister has been staying with her to assist her.  She feels, now, though, that she needs to go to rehab upon d/c.  Sisters agree.    Pt wanting [facility] or [facility].    CSW discussed the SNF process and provided Pt with a SNF list.    CSW thanked Pt and her family for their time.   Assessment/plan status:  Psychosocial Support/Ongoing Assessment of Needs Other assessment/ plan:   Information/referral to community resources:   SNF list    PATIENT'S/FAMILY'S RESPONSE TO PLAN OF CARE: Pt is hopeful for [facility], as her family knows the [facility].  Pt understands rehab is necessary and is in agreement.    Pt was cooperative and very pleasant.    Pt and family thanked CSW for time and assistance.   Providence Crosby, LCSWA Clinical Social Work [phone]

## 2013-10-05 NOTE — Progress Notes (Signed)
Utilization Review completed.  

## 2013-10-06 ENCOUNTER — Other Ambulatory Visit: Payer: Self-pay | Admitting: Hematology and Oncology

## 2013-10-06 LAB — URINE CULTURE

## 2013-10-06 LAB — GLUCOSE, CAPILLARY
Glucose-Capillary: 121 mg/dL — ABNORMAL HIGH (ref 70–99)
Glucose-Capillary: 135 mg/dL — ABNORMAL HIGH (ref 70–99)

## 2013-10-06 MED ORDER — VITAMINS A & D EX OINT
TOPICAL_OINTMENT | CUTANEOUS | Status: AC
  Administered 2013-10-06: 1
  Filled 2013-10-06: qty 5

## 2013-10-06 MED ORDER — FOSFOMYCIN TROMETHAMINE 3 G PO PACK
3.0000 g | PACK | Freq: Once | ORAL | Status: AC
  Administered 2013-10-06: 3 g via ORAL
  Filled 2013-10-06: qty 3

## 2013-10-06 NOTE — Progress Notes (Signed)
Kaylee [name] ZOX:[id number] DOB: June 28, 1941 DOA: 10/03/2013 PCP: Gwen Pounds, MD  Brief narrative: 72 y.o. 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   During recent hospitalization between 12/6-12/10, presented with left flank pain, presyncope, anorexia and polyuria. She was found to have enlarging pulmonary and hepatic metastasis along with UTI.  Liver biopsy which confirmed metastasis to the liver. MRI brain was negative for metastasis. She was discharged with outpatient followup with oncology and urology ED=orthostatic hypotension was redemonstrated, sodium 129, chloride 94, glucose 155, creatinine 1.2, albumin 3.4, hemoglobin 11.1 and acute abdominal series did not show acute findings. She was still orthostatic even after admission and was placed on IV saline 75 cc per hour This was discontinued 12/14 however patient is noncompliant with sodium chloride tablets and Florinef was added to her medication.  Past medical history-As per Problem list Chart reviewed as below- 11/16/12=Robot assist Lap-Nephrectomy [L] Known Dysphagia/Gerd-Sees Dr. Jannette Fogo 9/11/9 H/x Lung Ca 1985, type,staging unknown-Lobectomy RU Chronic ddd Hernia followed by Dr. Harlon Flor   Consultants:  None as yet-Dr. Bertis Ruddy will be informed on return 10/06/13  Procedures:  Acute abd series 12/12  Antibiotics:  Completed prior Cipro 10/03/13   Subjective  Feels better overall. No nausea no vomiting but still dizzy States still feels orthostatic Denies chest pain Denies shortness of breath  Objective    Interim History: None  Telemetry: NSR  Objective: Filed Vitals:   10/06/13 0637 10/06/13 1300  10/06/13 1315 10/06/13 1330  BP: 94/56 113/61 98/65 85/48   Pulse: 106 72 72 57  Temp:  98.2 F (36.8 C)  98.2 F (36.8 C)  TempSrc:  Oral  Oral  Resp:  18    Height:      Weight:      SpO2: 99% 97%  97%    Intake/Output Summary (Last 24 hours) at 10/06/13 1751 Last data filed at 10/06/13 1726  Gross per 24 hour  Intake    920 ml  Output   1081 ml  Net   -161 ml    Exam:  General: EOMI, NCAT Respiratory-clear, no added sound Abdomen: soft, Nt/ND Neuro nad  Data Reviewed: Basic Metabolic Panel:  Recent Labs Lab 10/03/13 1505 10/04/13 0510 10/05/13 0536  NA 129* 134* 137  K 4.3 4.3 4.0  CL 94* 104 106  CO2 22 23 23   GLUCOSE 155* 113* 160*  BUN 22 20 12   CREATININE 1.20* 1.00 0.94  CALCIUM 9.6 8.6 8.7   Liver Function Tests:  Recent Labs Lab 10/03/13 1505 10/05/13 0536  AST 13 12  ALT 8 7  ALKPHOS 112 88  BILITOT 0.4 0.2*  PROT 8.2 6.5  ALBUMIN 3.4* 2.8*   No results found for this basename: LIPASE, AMYLASE,  in the last 168 hours No results found for this basename: AMMONIA,  in the last 168 hours CBC:  Recent Labs Lab 10/03/13 1505 10/04/13 0510 10/05/13 0536  WBC 11.5* 9.8 8.6  NEUTROABS 7.7  --   --   HGB 11.1* 9.7* 9.5*  HCT 35.3* 30.3* 29.2*  MCV 80.2 80.6 80.0  PLT 353 312 301   Cardiac Enzymes:  Recent Labs Lab 10/03/13 1505 10/04/13 1300  TROPONINI <0.30 <0.30   BNP: No components found with this  basename: POCBNP,  CBG:  Recent Labs Lab 10/05/13 1621 10/05/13 2137 10/06/13 0817 10/06/13 1146 10/06/13 1635  GLUCAP 206* 148* 121* 182* 135*    Recent Results (from the past 240 hour(s))  URINE CULTURE     Status: None   Collection Time    10/04/13  3:28 AM      Result Value Range Status   Specimen Description URINE, CLEAN CATCH   Final   Special Requests NONE   Final   Culture  Setup Time     Final   Value: 10/04/2013 11:39     Performed at [facility] Count     Final   Value: >=100,000  COLONIES/ML     Performed at [facility]   Culture     Final   Value: ESCHERICHIA COLI     Performed at [facility]   Report Status 10/06/2013 FINAL   Final   Organism ID, Bacteria ESCHERICHIA COLI   Final     Studies:              All Imaging reviewed and is as per above notation   Scheduled Meds: . enoxaparin (LOVENOX) injection  40 mg Subcutaneous Q24H  . feeding supplement (ENSURE COMPLETE)  237 mL Oral TID BM  . fludrocortisone  0.1 mg Oral BID  . midodrine  2.5 mg Oral TID WC  . PARoxetine  20 mg Oral QPM  . sodium chloride  3 mL Intravenous Q12H   Continuous Infusions:    Assessment/Plan: 1. Symptomatic orthostatic hypotension-liberalize diet, cannot tolerate salt tablets  add Florinef 0.1 twice a day 12/13. Added Midodrin 2.5 3 times a day.   Unlikely SIADH currently as the rest of the patient's electrolytes are within normal range, and the sodium deficit has been corrected. Saline lock 12/14.   2. Resistant Escherichia coli pyelonephritis fosfomycin x1 given-completed a course of ciprofloxacin 10/03/13 3. Mild renal insufficiency/AKI on admission-Repleted with Iv saline.  Secondary to poor by mouth intake and salt wasting from hypovolemic hyponatremia   4. Recent stage IV metastatic renal cell carcinoma resection- ECOG 1-will discuss with both urology and oncology on 12/15-appreciate oncology input 5. 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 Iatrogenic diarrhea, not infectious-hold on MiraLax and senna 7. Depression-continue Paxil 20 every afternoon, Xanax 0.25 twice a day when necessary-became anxious overnight 12/13 8. Severe protein energy malnutrition Body mass index is 18.61 kg/(m^2).-reinforced diet to improve ECoG status 9. Stage II gold COPD-no need for inhalers currently 10. Impaired glucose tolerance-would not aggressively monitor unless a.m. blood sugars above 200  Code Status: Full  Family Communication: Patient  able to update her family fully Disposition Plan: Inpatient, likely discharge a.m.   Pleas Koch, MD  [facility] Pager [phone] 10/06/2013, 5:51 PM    LOS: 3 days

## 2013-10-06 NOTE — Progress Notes (Signed)
Physical Therapy Treatment Patient Details Name: DENECE SHEARER MRN: [id number] DOB: 02/16/1941 Today's Date: 10/06/2013 Time: 4098-1191 PT Time Calculation (min): 30 min  PT Assessment / Plan / Recommendation  History of Present Illness  Nai MAKENZIE WEISNER is a 72 y.o. female was recently discharged from the hospital on 10/01/13 and returns on 10/03/13 due to complaints of dizziness in the upright position, anorexia and generalized weakness. 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 Her sister [name] has been staying with her most of the time since hospital discharge. When home health PT went to evaluate her, they found her to be orthostatic, PCPs office was contacted and patient was advised to come to the ED for further management. In the ED, orthostatic hypotension was redemonstrated, sodium 129, chloride 94,    PT Comments   Per RN pt continues to have orthostatic hypotension, so attempted UE exercises upon sitting EOB to assist with increasing BP prior to standing and pt able to tolerate short distance ambulation today.  Pt reports her dizziness is worst with positional changes.  Pt also reports hx of vertigo which she states her dizziness feels similar to her last vertigo occurrence.  May attempt some vestibular testing if still having dizziness next session.   Follow Up Recommendations  SNF     Does the patient have the potential to tolerate intense rehabilitation     Barriers to Discharge        Equipment Recommendations  Rolling walker with 5" wheels    Recommendations for Other Services    Frequency Min 3X/week   Progress towards PT Goals Progress towards PT goals: Progressing toward goals  Plan Current plan remains appropriate    Precautions / Restrictions Precautions Precautions: Fall Precaution Comments: orthostatic  hypotension, may also have vestibular component   Pertinent Vitals/Pain No pain    Mobility  Bed Mobility Bed Mobility: Supine to Sit Supine to Sit: 5: Supervision Details for Bed Mobility Assistance: pt aware to sit for a few minutes until dizziness subsided Transfers Transfers: Sit to Stand;Stand to Sit Sit to Stand: 4: Min assist;With upper extremity assist;From bed;From chair/3-in-1 Stand to Sit: 4: Min assist;With upper extremity assist;To chair/3-in-1;To bed Details for Transfer Assistance: required to attempts to stand due to too dizzy upon initial attempt, had pt perform a few UE exercises to assist with increasing BP as nsg student just took orthostatics Ambulation/Gait Ambulation/Gait Assistance: 4: Min Environmental consultant (Feet): 20 Feet (x2) Assistive device: Rolling walker Ambulation/Gait Assistance Details: assist to steady, increased dizziness as distance increased, pt encouraged to take seated rest break, pt very happy to be up walking however aware of need for assist due to imbalance and dizziness, RN followed with recliner for safety Gait Pattern: Step-through pattern;Narrow base of support;Scissoring;Decreased stride length    Exercises General Exercises - Lower Extremity Ankle Circles/Pumps: AROM;Both;20 reps Heel Slides: AROM;Both;15 reps;Supine Hip ABduction/ADduction: AROM;Both;15 reps;Supine Straight Leg Raises: AROM;Both;15 reps;Supine Hip Flexion/Marching: AROM;Both;15 reps   PT Diagnosis:    PT Problem List:   PT Treatment Interventions:     PT Goals (current goals can now be found in the care plan section)    Visit Information  Last PT Received On: 10/06/13 Assistance Needed: +2 (ambulation) History of Present Illness:  LAURE LEONE is a 72 y.o. female was recently discharged from the hospital on 10/01/13 and returns on 10/03/13 due to complaints  of dizziness in the upright position, anorexia and generalized weakness. Patient has  history of recently diagnosed renal cell carcinoma/renal vein involvement, status post left radical nephrectomy on 09/15/13.She complains of dizziness in the upright position. Her sister [name] has been staying with her most of the time since hospital discharge. When home health PT went to evaluate her, they found her to be orthostatic, PCPs office was contacted and patient was advised to come to the ED for further management. In the ED, orthostatic hypotension was redemonstrated, sodium 129, chloride 94,     Subjective Data      Cognition  Cognition Arousal/Alertness: Awake/alert Behavior During Therapy: WFL for tasks assessed/performed Overall Cognitive Status: Within Functional Limits for tasks assessed    Balance     End of Session PT - End of Session Equipment Utilized During Treatment: Gait belt Activity Tolerance: Patient limited by fatigue;Treatment limited secondary to medical complications (Comment) Patient left: with call bell/phone within reach;in chair (pt aware of need for assist out of recliner)   GP     Laquan Beier,KATHrine E 10/06/2013, 5:01 PM Zenovia Jarred, PT, DPT 10/06/2013 Pager: [phone]

## 2013-10-06 NOTE — Progress Notes (Signed)
Provided patient with bed offers. She is agreeable to [facility]. Notified [facility]. Submitted information for precert with [facility].  Garl Speigner C. Laquana Villari MSW, LCSW [phone]

## 2013-10-06 NOTE — Care Management Note (Addendum)
    Page 1 of 2   10/10/2013     12:57:02 PM   CARE MANAGEMENT NOTE 10/10/2013  Patient:  Kaylee [name],Kaylee [name]   Account Number:  [id number]  Date Initiated:  10/04/2013  Documentation initiated by:  DAVIS,TYMEEKA  Subjective/Objective Assessment:   72 yo female admitted with hypotension.PCP: Gwen Pounds, MD     Action/Plan:   Home when stable   Anticipated DC Date:  10/10/2013   Anticipated DC Plan:  SKILLED NURSING FACILITY      DC Planning Services  CM consult      [facility] Choice  Resumption Of Svcs/PTA Provider   Choice offered to / List presented to:  C-1 Patient        HH arranged  HH-2 PT      [facility] agency  [facility]   Status of service:  Completed, signed off Medicare Important Message given?   (If response is "NO", the following Medicare IM given date fields will be blank) Date Medicare IM given:   Date Additional Medicare IM given:    Discharge Disposition:  SKILLED NURSING FACILITY  Per UR Regulation:  Reviewed for med. necessity/level of care/duration of stay  If discussed at Long Length of Stay Meetings, dates discussed:    Comments:  10/08/13 Akashdeep Chuba RN,BSN NCM [phone] ORTHOSTATIC.D/C SNF.  10/06/13 Niyam Bisping RN,BSN NCM [phone] PT-SNF.  10/04/2013 1400 NCM spoke to pt and she is active with [facility]. Will resumption on care orders from attending. Pt may benefit from [facility] RN to monitor BP and meds. Will notify MD. Pt lives alone but her sister stays with her quite often. Pt states she does need RW for [facility] and order was placed. She is not sure who is suppose to deliver. Explained NCM will follow up with [facility]. Contacted [facility] and they received request on 12/10. Explained RW will need to be delivered to hospital. Faxed orders to [facility] for RW. Requested RW delivery to hospital today.  Isidoro Donning RN CCM Case Mgmt phone [phone]  10/04/13 Roland Earl [phone] UR complete

## 2013-10-07 ENCOUNTER — Ambulatory Visit: Payer: Medicare HMO

## 2013-10-07 ENCOUNTER — Ambulatory Visit: Payer: Medicare HMO | Admitting: Hematology and Oncology

## 2013-10-07 LAB — GLUCOSE, CAPILLARY
Glucose-Capillary: 159 mg/dL — ABNORMAL HIGH (ref 70–99)
Glucose-Capillary: 173 mg/dL — ABNORMAL HIGH (ref 70–99)
Glucose-Capillary: 170 mg/dL — ABNORMAL HIGH (ref 70–99)

## 2013-10-07 MED ORDER — FLUDROCORTISONE ACETATE 0.1 MG PO TABS
0.3000 mg | ORAL_TABLET | Freq: Three times a day (TID) | ORAL | Status: DC
  Administered 2013-10-07 – 2013-10-10 (×9): 0.3 mg via ORAL
  Filled 2013-10-07 (×12): qty 3

## 2013-10-07 MED ORDER — CALCIUM CARBONATE ANTACID 500 MG PO CHEW
400.0000 mg | CHEWABLE_TABLET | Freq: Three times a day (TID) | ORAL | Status: DC | PRN
  Administered 2013-10-07 – 2013-10-09 (×4): 400 mg via ORAL
  Filled 2013-10-07 (×5): qty 2

## 2013-10-07 MED ORDER — FLUDROCORTISONE ACETATE 0.1 MG PO TABS
0.2000 mg | ORAL_TABLET | Freq: Once | ORAL | Status: AC
  Administered 2013-10-07: 11:00:00 0.2 mg via ORAL
  Filled 2013-10-07: qty 2

## 2013-10-07 MED ORDER — MIDODRINE HCL 5 MG PO TABS
5.0000 mg | ORAL_TABLET | Freq: Three times a day (TID) | ORAL | Status: DC
  Administered 2013-10-07 – 2013-10-08 (×4): 5 mg via ORAL
  Filled 2013-10-07 (×6): qty 1

## 2013-10-07 NOTE — Progress Notes (Signed)
Auth obtained from [place]. Patient's sister to sign paperwork at [facility] at 230pm. Awaiting MD decision on if patient will discharge today or tomorrow.  Mitzy Naron C. Toluwani Yadav MSW, LCSW [phone]

## 2013-10-07 NOTE — Progress Notes (Signed)
Physical Therapy Treatment Patient Details Name: Kaylee [name] MRN: [id number] DOB: 07-12-1941 Today's Date: 10/07/2013 Time: 4098-1191 PT Time Calculation (min): 32 min  PT Assessment / Plan / Recommendation  History of Present Illness  Kaylee [name] is a 72 y.o. female was recently discharged from the hospital on 10/01/13 and returns on 10/03/13 due to complaints of dizziness in the upright position, anorexia and generalized weakness. 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 Her sister Ms. Kaylee [name] has been staying with her most of the time since hospital discharge. When home health PT went to evaluate her, they found her to be orthostatic, PCPs office was contacted and patient was advised to come to the ED for further management. In the ED, orthostatic hypotension was redemonstrated, sodium 129, chloride 94,    PT Comments   Vestibular assessment performed with pt.  Pt reports dizziness with head shaking and VOR only.  Performed Gilberto Better on left and right however no nystagmus observed despite pt reporting dizziness.  Also tested R horizontal canal which was positive for nystagmus so performed BBQ roll with pt as treatment.  Pt then ambulated in hallway and became dizzy during ambulation again today however RN reports pt still with orthostatic hypotension this morning.  Pt presents with R horizontal canal BPPV and would benefit from ST-SNF prior to home.  Will check back tomorrow if pt remains in acute for follow up on BPPV.   Follow Up Recommendations  SNF     Does the patient have the potential to tolerate intense rehabilitation     Barriers to Discharge        Equipment Recommendations  Rolling walker with 5" wheels    Recommendations for Other Services    Frequency Min 3X/week   Progress towards PT Goals Progress towards  PT goals: Progressing toward goals  Plan Current plan remains appropriate    Precautions / Restrictions Precautions Precautions: Fall Precaution Comments: orthostatic hypotension, R horizontal canal BPPV   Pertinent Vitals/Pain Orthostatic hypotension per RN    Mobility  Bed Mobility Bed Mobility: Supine to Sit Supine to Sit: 5: Supervision Sit to Supine: 5: Supervision Details for Bed Mobility Assistance: pt aware to sit for a few minutes until dizziness subsided Transfers Transfers: Sit to Stand;Stand to Sit Sit to Stand: With upper extremity assist;From bed;From chair/3-in-1;4: Min guard Stand to Sit: With upper extremity assist;To chair/3-in-1;To bed;4: Min guard Details for Transfer Assistance: pt assisted to [facility], reports no dizziness with quick transfer Ambulation/Gait Ambulation/Gait Assistance: 4: Min guard Ambulation Distance (Feet): 40 Feet (x2) Assistive device: Rolling walker Ambulation/Gait Assistance Details: pt became dizzy after 40 feet requiring seated rest break, had pt perform UE exercises to help with dizziness, no physical assist required today to steady, followed with recliner for safety Gait Pattern: Step-through pattern;Narrow base of support;Decreased stride length    Exercises     PT Diagnosis:    PT Problem List:   PT Treatment Interventions:     PT Goals (current goals can now be found in the care plan section)    Visit Information  Last PT Received On: 10/07/13 Assistance Needed: +2 (for chair during ambulation) History of Present Illness:  Kaylee [name] is a 72 y.o. female was recently discharged from the hospital on 10/01/13 and returns on 10/03/13 due to complaints of dizziness in the upright position, anorexia and generalized weakness. Patient has  history of recently diagnosed renal cell carcinoma/renal vein involvement, status post left radical nephrectomy on 09/15/13.She complains of dizziness in the upright position. Her sister [name] has been staying with her most of the time since hospital discharge. When home health PT went to evaluate her, they found her to be orthostatic, PCPs office was contacted and patient was advised to come to the ED for further management. In the ED, orthostatic hypotension was redemonstrated, sodium 129, chloride 94,     Subjective Data      Cognition  Cognition Arousal/Alertness: Awake/alert Behavior During Therapy: WFL for tasks assessed/performed Overall Cognitive Status: Within Functional Limits for tasks assessed    Balance     End of Session PT - End of Session Equipment Utilized During Treatment: Gait belt Activity Tolerance: Other (comment) (limited by orthostatic hypotension) Patient left: in bed;with call bell/phone within reach   GP     [facility] E 10/07/2013, 10:58 AM Zenovia Jarred, PT, DPT 10/07/2013 Pager: [phone]

## 2013-10-07 NOTE — Progress Notes (Signed)
Kaylee [name] VQQ:[id number] DOB: 06/26/1941 DOA: 10/03/2013 PCP: Gwen Pounds, MD  Brief narrative: 72 y.o. 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   During recent hospitalization between 12/6-12/10, presented with left flank pain, presyncope, anorexia and polyuria. She was found to have enlarging pulmonary and hepatic metastasis along with UTI.  Liver biopsy which confirmed metastasis to the liver. MRI brain was negative for metastasis. She was discharged with outpatient followup with oncology and urology ED=orthostatic hypotension was redemonstrated, sodium 129, chloride 94, glucose 155, creatinine 1.2, albumin 3.4, hemoglobin 11.1 and acute abdominal series did not show acute findings. She was still orthostatic even after admission and was placed on IV saline 75 cc per hour This was discontinued 12/14 however patient is noncompliant with sodium chloride tablets and Florinef was added to her medication. As she still remained orthostatic Midrin was added as well and both these medications were maxed out 12/16 pm  Past medical history-As per Problem list Chart reviewed as below- 11/16/12=Robot assist Lap-Nephrectomy [L] Known Dysphagia/Gerd-Sees Dr. Jannette Fogo 9/11/9 H/x Lung Ca 1985, type,staging unknown-Lobectomy RU Chronic ddd Hernia followed by Dr. Harlon Flor   Consultants:  None as yet-Dr. Bertis Ruddy will be informed on return 10/06/13  Procedures:  Acute abd series 12/12  Antibiotics:  Completed prior Cipro 10/03/13   Subjective  Still dizzy on standing with orthostasis her blood pressure 70/50  increased her medications Florinef and Midrin to max dosing this morning     Objective    Interim History: None  Telemetry: NSR  Objective: Filed Vitals:   10/07/13 0443 10/07/13 1354 10/07/13 1356 10/07/13 1358  BP: 67/47 121/54 101/64 76/50  Pulse: 82 62 96 79  Temp:  98.1 F (36.7 C)    TempSrc:  Oral    Resp:  20    Height:      Weight:      SpO2: 97% 98%      Intake/Output Summary (Last 24 hours) at 10/07/13 1451 Last data filed at 10/07/13 1355  Gross per 24 hour  Intake    440 ml  Output   1000 ml  Net   -560 ml    Exam:  General: EOMI, NCAT Respiratory-clear, no added sound Abdomen: soft, Nt/ND Neuro nad  Data Reviewed: Basic Metabolic Panel:  Recent Labs Lab 10/03/13 1505 10/04/13 0510 10/05/13 0536  NA 129* 134* 137  K 4.3 4.3 4.0  CL 94* 104 106  CO2 22 23 23   GLUCOSE 155* 113* 160*  BUN 22 20 12   CREATININE 1.20* 1.00 0.94  CALCIUM 9.6 8.6 8.7   Liver Function Tests:  Recent Labs Lab 10/03/13 1505 10/05/13 0536  AST 13 12  ALT 8 7  ALKPHOS 112 88  BILITOT 0.4 0.2*  PROT 8.2 6.5  ALBUMIN 3.4* 2.8*   No results found for this basename: LIPASE, AMYLASE,  in the last 168 hours No results found for this basename: AMMONIA,  in the last 168 hours CBC:  Recent Labs Lab 10/03/13 1505 10/04/13 0510 10/05/13 0536  WBC 11.5* 9.8 8.6  NEUTROABS 7.7  --   --   HGB 11.1* 9.7* 9.5*  HCT 35.3* 30.3* 29.2*  MCV 80.2 80.6 80.0  PLT 353 312 301   Cardiac Enzymes:  Recent Labs Lab 10/03/13 1505 10/04/13 1300  TROPONINI <0.30 <0.30   BNP: No components found with this basename: POCBNP,  CBG:  Recent Labs Lab 10/06/13 1146 10/06/13 1635 10/06/13 2134 10/07/13 0734 10/07/13 1218  GLUCAP 182* 135* 205* 118* 159*    Recent Results (from the past 240 hour(s))  URINE CULTURE     Status: None   Collection Time    10/04/13  3:28 AM      Result Value Range Status   Specimen Description URINE, CLEAN CATCH   Final   Special Requests NONE   Final   Culture  Setup Time     Final   Value: 10/04/2013  11:39     Performed at [facility] Count     Final   Value: >=100,000 COLONIES/ML     Performed at [facility]   Culture     Final   Value: ESCHERICHIA COLI     Performed at [facility]   Report Status 10/06/2013 FINAL   Final   Organism ID, Bacteria ESCHERICHIA COLI   Final     Studies:              All Imaging reviewed and is as per above notation   Scheduled Meds: . enoxaparin (LOVENOX) injection  40 mg Subcutaneous Q24H  . feeding supplement (ENSURE COMPLETE)  237 mL Oral TID BM  . fludrocortisone  0.3 mg Oral TID  . midodrine  5 mg Oral TID WC  . PARoxetine  20 mg Oral QPM  . sodium chloride  3 mL Intravenous Q12H   Continuous Infusions:    Assessment/Plan: 1. Symptomatic orthostatic hypotension-liberalize diet, cannot tolerate salt tablets  increase Midrin and Florinef to max possible dosages   Unlikely SIADH currently as the rest of the patient's electrolytes are within normal range, and the sodium deficit has been corrected. Saline lock 12/14.   2. Benign positional vertigo- physical therapy physical therapy felt that the patient had some benign positional vertigo symptoms as well and will work with her on this 3. Resistant Escherichia coli pyelonephritis fosfomycin x1 given-completed a course of ciprofloxacin 10/03/13 4. Mild renal insufficiency/AKI on admission-Repleted with Iv saline.  Secondary to poor by mouth intake and salt wasting from hypovolemic hyponatremia   5. Recent stage IV metastatic renal cell carcinoma resection- ECOG 1-will discuss with both urology and oncology on 12/15-appreciate oncology input 6. 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 Iatrogenic diarrhea, not infectious-hold on MiraLax and senna 8. Depression-continue Paxil 20 every afternoon, Xanax 0.25 twice a day when necessary-became anxious overnight 12/13 9. Severe protein energy malnutrition Body mass index is 18.61  kg/(m^2).-reinforced diet to improve ECoG status 10. Stage II gold COPD-no need for inhalers currently 11. Impaired glucose tolerance-would not aggressively monitor unless a.m. blood sugars above 200  Code Status: Full  Family Communication: Discussed with sister and telephone 12/16 Disposition Plan: Inpatient, likely discharge to SNF when patient is able to have elevated pressures and tolerate  Pleas Koch, MD  [facility] Pager [phone] 10/07/2013, 2:51 PM    LOS: 4 days

## 2013-10-07 NOTE — Progress Notes (Signed)
Clinical Social Work  Per MD, patient not medically stable to DC today. CSW called and left a message with Janie at [facility] in order to update her on patient's DC plans.   Unk Lightning, LCSW (Coverage for [employer])

## 2013-10-07 NOTE — Progress Notes (Signed)
Received referral for [facility] Care Management services on behalf of COPD GOLD program. It appears discharge plan is for SNF at this time. Therefore, [facility] Care Management not appropriate at this time. [facility] Care Management appropriate if discharge plan is home.  Raiford Noble, MSN- Ed, RN,BSN- [facility] [phone]

## 2013-10-08 DIAGNOSIS — E43 Unspecified severe protein-calorie malnutrition: Secondary | ICD-10-CM

## 2013-10-08 DIAGNOSIS — F329 Major depressive disorder, single episode, unspecified: Secondary | ICD-10-CM

## 2013-10-08 LAB — GLUCOSE, CAPILLARY: Glucose-Capillary: 191 mg/dL — ABNORMAL HIGH (ref 70–99)

## 2013-10-08 MED ORDER — SODIUM CHLORIDE 0.9 % IV SOLN
INTRAVENOUS | Status: DC
  Administered 2013-10-08: 75 mL/h via INTRAVENOUS
  Administered 2013-10-09 (×2): via INTRAVENOUS

## 2013-10-08 MED ORDER — ALBUMIN HUMAN 5 % IV SOLN
25.0000 g | Freq: Once | INTRAVENOUS | Status: AC
  Administered 2013-10-09: 01:00:00 25 g via INTRAVENOUS
  Filled 2013-10-08: qty 500

## 2013-10-08 MED ORDER — MIDODRINE HCL 5 MG PO TABS
10.0000 mg | ORAL_TABLET | Freq: Three times a day (TID) | ORAL | Status: DC
  Administered 2013-10-08 – 2013-10-09 (×4): 10 mg via ORAL
  Filled 2013-10-08 (×5): qty 2

## 2013-10-08 MED ORDER — ALBUMIN HUMAN 5 % IV SOLN
25.0000 g | Freq: Once | INTRAVENOUS | Status: DC
  Filled 2013-10-08: qty 500

## 2013-10-08 NOTE — Progress Notes (Signed)
[facility] PROGRESS NOTE  Danisa Kopec Rullo ZOX:[id number] DOB: 1941/07/03 DOA: 10/03/2013 PCP: Gwen Pounds, MD  Assessment/Plan: Orthostatic hypotension -Transfuse 500 cc albumin today -Restart IV fluids -Increase midodrine to 10mg  tid -Continue Florinef -Liberalize diet -Unable to tolerate soft tablets -Check TSH -Check a.m. Cortisol Benign positional vertigo -PT felt patient had some benign positional vertigo symptoms -Give a trial of meclizine if no improvement Metastatic renal cell carcinoma, stage IV -Status post cytoreductive nephrectomy November 2014 -Patient will followup with Dr. Berneice Heinrich after d/c Impaired glucose tolerance -Hemoglobin A1c -Given clinical situation, would not restrict diet at this time Anemia of chronic disease  -Remains stable  -continue to monitor  Depression  - continue Paxil  COPD  -Stable  Family Communication:   Pt at beside Disposition Plan:   Home when medically stable       Procedures/Studies: Ct Chest W Contrast  09/29/2013   CLINICAL DATA:  Cough. Lung cancer diagnosed 1985 status post right upper lobectomy. Left renal cancer diagnosed 2014 status post left nephrectomy.  EXAM: CT CHEST WITH CONTRAST  TECHNIQUE: Multidetector CT imaging of the chest was performed during intravenous contrast administration.  CONTRAST:  80mL OMNIPAQUE IOHEXOL 300 MG/ML  SOLN  COMPARISON:  CT abdomen pelvis dated 09/27/2013. PET-CT dated 08/06/2013.  FINDINGS: Status post right upper lobectomy.  Numerous bilateral pulmonary nodules/metastases throughout all lobes, progressed from prior PET-CT. Representative nodules include:  --12 x 10 mm nodule in the anterior left upper lobe (series 5/image 18), previously 6 mm  --9 x 8 mm nodule in the right middle lobe (series 5/image 20), previously 5 mm  --10 x 12 mm nodule at the right lung base (series 5/image 44), previously 6 mm  --13 x 17 mm nodule at the left lung base (series 5/image 49), previously 9 x  12 mm  Moderate paraseptal emphysematous changes. Left apical pleural parenchymal scarring. No pleural effusion or pneumothorax.  Visualized thyroid is unremarkable.  The heart is normal in size. No pericardial effusion. Mild coronary atherosclerosis. Atherosclerotic calcifications of the aortic arch.  Small mediastinal lymph nodes measuring up to 7 mm, within normal limits. No suspicious hilar or axillary lymphadenopathy.  Bilateral breast augmentation.  Visualized upper abdomen is notable for hepatic metastases (series 2/images 42 and 47), better evaluated on recent CT abdomen pelvis. Postsurgical changes/fluid related to prior left nephrectomy (series 2/image 58), incompletely visualized.  Mild degenerative changes of the visualized thoracolumbar spine.  IMPRESSION: Status post right upper lobectomy.  Numerous bilateral pulmonary nodules/metastases throughout all lobes, measuring up to 1.7 cm, progressed from recent prior PET-CT.  At least two hepatic metastases, better evaluated on recent CT abdomen/pelvis.  Status post left nephrectomy with associated postsurgical changes/fluid, incompletely visualized.   Electronically Signed   By: Charline Bills M.D.   On: 09/29/2013 11:21   Mr Laqueta Jean WJ Contrast  09/30/2013   CLINICAL DATA:  History of renal cell carcinoma and lung cancer. Dizziness. Staging for intracranial metastatic disease.  EXAM: MRI HEAD WITHOUT AND WITH CONTRAST  TECHNIQUE: Multiplanar, multiecho pulse sequences of the brain and surrounding structures were obtained without and with intravenous contrast.  CONTRAST:  10mL MULTIHANCE GADOBENATE DIMEGLUMINE 529 MG/ML IV SOLN  COMPARISON:  None.  FINDINGS: No acute infarct.  No intracranial mass or abnormal enhancement.  No intracranial hemorrhage.  Mild small vessel disease type changes.  Atrophy without hydrocephalus.  Major intracranial vascular structures are patent.  Partial opacification mastoid air cells greater on left. No obstructing lesion  posterior superior nasopharynx.  Cervical medullary junction, pituitary region, pineal region and orbital structures unremarkable.  IMPRESSION: No evidence of intracranial metastatic disease.  No acute infarct.  Small vessel disease type changes.  Global atrophy.  Partial opacification mastoid air cells greater on the left.   Electronically Signed   By: Bridgett Larsson M.D.   On: 09/30/2013 12:26   Ct Abdomen Pelvis W Contrast  09/27/2013   CLINICAL DATA:  Abdominal pain since left nephrectomy on 09/03/2013.  EXAM: CT ABDOMEN AND PELVIS WITH CONTRAST  TECHNIQUE: Multidetector CT imaging of the abdomen and pelvis was performed using the standard protocol following bolus administration of intravenous contrast.  CONTRAST:  50mL OMNIPAQUE IOHEXOL 300 MG/ML SOLN, 80mL OMNIPAQUE IOHEXOL 300 MG/ML SOLN  COMPARISON:  MRI of the abdomen dated 08/14/2013, PET-CT scan dated 08/06/2013, and CT scan abdomen dated 10/30/2010  FINDINGS: Patient has developed much more extensive pulmonary metastatic disease since the prior PET-CT of 08/06/2013. There are numerous bilateral pulmonary nodules at the lung bases. The largest nodule is at the left lung base posteriorly measuring 13 x 17 mm on image number 10 of series 2 signed report in. The largest nodule on the right is 12 x 10 mm on image number 11 of series 4.  The lesion in the anterior aspect left lobe of the liver has enlarged and now measures 27 x 19 mm. There is a new lesion in the dome of the liver measuring 9 mm.  Biliary tree appears normal. There is a 5 mm lymph node adjacent to the gallbladder on image number 31 of series 2.  Spleen, pancreas, right adrenal gland, and right kidney appear normal. The left adrenal gland is not visualized and may have been removed.  There is minimal postsurgical scar at the site of the previous nephrectomy.  The bowel appears normal.  There is extensive air in the subcutaneous fat superficial to the muscles of the left anterior and lateral  abdominal wall. There is no pus collection. This amount of air in the subcutaneous fat is atypical.  There is a tiny amount of air in the bladder.  IMPRESSION: 1. Marked progression of pulmonary metastases and liver metastases since the prior PET-CT scan dated 08/06/2013. 2. Extensive air in the subcutaneous fat superficial to the muscles of the left side of the anterior lateral abdominal wall without fluid collections to suggest an abscess. This amount of air is atypical. This is of unknown etiology. 3. Minimal postsurgical changes at the site of the previous nephrectomy.   Electronically Signed   By: Geanie Cooley M.D.   On: 09/27/2013 14:17   US Biopsy  09/29/2013   CLINICAL DATA:  History of left-sided renal cell carcinoma, post left-sided nephrectomy, now with CT findings worrisome for progression / development of hepatic and pulmonary metastases. Please attempted ultrasound-guided liver lesion biopsy.  EXAM: ULTRASOUND BIOPSY CORE LIVER  COMPARISON:  Chest CT - earlier same day; CT abdomen pelvis -09/27/2013; PET-CT- 07/27/2013; abdominal MRI -08/14/2013  FINDINGS: Exhaustive ultrasound scanning was performed by the dictating radiologist however failed to definitively delineate the known dominant hyperenhancing mass involving the anterior segment of the dome of the right lobe of the liver, demonstrated on preprocedural imaging. As such, the patient was brought to CT for attempted CT-guided biopsy biopsy.  IMPRESSION: Known hyperenhancing dominant mass within the anterior segment of the dome of the right lobe of the liver is not apparent on ultrasound, as such, patient was escorted to CT to undergo attempted CT-guided biopsy.   Electronically  Signed   By: Simonne Come M.D.   On: 09/29/2013 14:28   Ct Biopsy  09/29/2013   INDICATION: History of renal cell carcinoma, post nephrectomy, now with recent findings worrisome for progression of pulmonary and hepatic metastatic disease.  EXAM: CT BIOPSY OF  HYPERVASCULAR MASS WITHIN THE DOME OF THE RIGHT LOBE OF THE LIVER  MEDICATIONS: Fentanyl 200 mcg IV; Versed 2 mg IV  ANESTHESIA/SEDATION: Sedation time  25 minutes  CONTRAST:  None  COMPARISON:  Chest CT - 09/29/2013; CT abdomen pelvis -09/27/2013; abdominal MRI -08/14/2013; PET-CT - 08/06/2013  PROCEDURE: Informed consent was obtained from the patient following an explanation of the procedure, risks, benefits and alternatives. A time out was performed prior to the initiation of the procedure.  The patient was positioned supine on the CT table and a limited CT was performed for procedural planning demonstrating grossly unchanged appearance of an approximately 1.6 x 1.3 cm hypo attenuating lesion within the anterior aspect of the dome of the right lobe of the liver (image 2, series 2). Ultrasound scanning was again performed however again failed to delineate a definitive lesion at this location. As such, the procedure was attempted with CT fluoroscopic guidance.  The procedure was planned. The operative site was prepped and draped in the usual sterile fashion. Appropriate trajectory was confirmed with a 22 gauge spinal needle after the adjacent tissues were anesthetized with 1% Lidocaine with epinephrine. Under intermittent CT guidance, a 17 gauge coaxial needle was advanced into the peripheral aspect of the mass. Positioning was confirmed and 3 core needle biopsies were obtained with an 18 gauge core needle biopsy device. The co-axial needle was removed and hemostasis was achieved with manual compression.  A limited postprocedural CT was negative for hemorrhage or additional complication. A dressing was placed. The patient tolerated the procedure well without immediate postprocedural complication.  COMPLICATIONS: None immediate  IMPRESSION: Technically successful CT guided core needle biopsy ofdominant hypo attenuating lesion within the anterior aspect of the dome of the right lobe of the liver.   Electronically  Signed   By: Simonne Come M.D.   On: 09/29/2013 15:53   Dg Abd Acute W/chest  10/03/2013   CLINICAL DATA:  Status post left nephrectomy on 09/15/2013, weakness, orthostatic hypotension, abdominal discomfort  EXAM: ACUTE ABDOMEN SERIES (ABDOMEN 2 VIEW & CHEST 1 VIEW)  COMPARISON:  CT chest dated 09/29/2013. CT abdomen pelvis dated 09/29/2013.  FINDINGS: Nodularity in the lateral right mid lung, corresponding to suspected metastases on CT. Additional scattered left lung nodules are not well visualized radiographically. No pleural effusion or pneumothorax.  The heart is normal in size. Surgical clips in the right perihilar region.  Bilateral breast augmentation.  Nonobstructive bowel gas pattern.  Surgical clips in the left mid abdomen related to prior nephrectomy.  No evidence of free air on the lateral decubitus view.  Mild degenerative changes of the visualized thoracolumbar spine.  IMPRESSION: Pulmonary nodularity in the right mid lung, better visualized on prior CT. Additional known left lung nodules are not well visualized radiographically.  No evidence of small bowel obstruction or free air.   Electronically Signed   By: Charline Bills M.D.   On: 10/03/2013 16:05         Subjective: Patient states that her dizziness is grossly syndrome. He denies any fevers, chills, chest discomfort, shortness breath, nausea, vomiting, diarrhea, vomiting, dysuria, hematuria. No rashes.   Objective: Filed Vitals:   10/08/13 1610 10/08/13 0447 10/08/13 0449 10/08/13 1300  BP:  124/55 99/64 80/52  114/55  Pulse: 74 81 92 61  Temp: 98 F (36.7 C)   98.1 F (36.7 C)  TempSrc: Oral   Oral  Resp: 18     Height:      Weight: 52.6 kg (115 lb 15.4 oz)     SpO2: 96%   96%    Intake/Output Summary (Last 24 hours) at 10/08/13 1547 Last data filed at 10/08/13 0700  Gross per 24 hour  Intake    920 ml  Output    200 ml  Net    720 ml   Weight change:  Exam:   General:  Pt is alert, follows commands  appropriately, not in acute distress  HEENT: No icterus, No thrush, [place]/AT  Cardiovascular: RRR, S1/S2, no rubs, no gallops  Respiratory: diminished breath sounds at the bases. Clear to auscultation otherwise. No wheezing.  Abdomen: Soft/+BS, non tender, non distended, no guarding  Extremities: trace edema, No lymphangitis, No petechiae, No rashes, no synovitis  Data Reviewed: Basic Metabolic Panel:  Recent Labs Lab 10/03/13 1505 10/04/13 0510 10/05/13 0536  NA 129* 134* 137  K 4.3 4.3 4.0  CL 94* 104 106  CO2 22 23 23   GLUCOSE 155* 113* 160*  BUN 22 20 12   CREATININE 1.20* 1.00 0.94  CALCIUM 9.6 8.6 8.7   Liver Function Tests:  Recent Labs Lab 10/03/13 1505 10/05/13 0536  AST 13 12  ALT 8 7  ALKPHOS 112 88  BILITOT 0.4 0.2*  PROT 8.2 6.5  ALBUMIN 3.4* 2.8*   No results found for this basename: LIPASE, AMYLASE,  in the last 168 hours No results found for this basename: AMMONIA,  in the last 168 hours CBC:  Recent Labs Lab 10/03/13 1505 10/04/13 0510 10/05/13 0536  WBC 11.5* 9.8 8.6  NEUTROABS 7.7  --   --   HGB 11.1* 9.7* 9.5*  HCT 35.3* 30.3* 29.2*  MCV 80.2 80.6 80.0  PLT 353 312 301   Cardiac Enzymes:  Recent Labs Lab 10/03/13 1505 10/04/13 1300  TROPONINI <0.30 <0.30   BNP: No components found with this basename: POCBNP,  CBG:  Recent Labs Lab 10/07/13 1218 10/07/13 1707 10/07/13 2134 10/08/13 0741 10/08/13 1142  GLUCAP 159* 173* 170* 107* 191*    Recent Results (from the past 240 hour(s))  URINE CULTURE     Status: None   Collection Time    10/04/13  3:28 AM      Result Value Range Status   Specimen Description URINE, CLEAN CATCH   Final   Special Requests NONE   Final   Culture  Setup Time     Final   Value: 10/04/2013 11:39     Performed at [facility] Count     Final   Value: >=100,000 COLONIES/ML     Performed at [facility]   Culture     Final   Value: ESCHERICHIA COLI     Performed at  [facility]   Report Status 10/06/2013 FINAL   Final   Organism ID, Bacteria ESCHERICHIA COLI   Final     Scheduled Meds: . albumin human  25 g Intravenous Once  . enoxaparin (LOVENOX) injection  40 mg Subcutaneous Q24H  . feeding supplement (ENSURE COMPLETE)  237 mL Oral TID BM  . fludrocortisone  0.3 mg Oral TID  . midodrine  10 mg Oral TID WC  . PARoxetine  20 mg Oral QPM  . sodium chloride  3 mL Intravenous Q12H   Continuous Infusions: . sodium chloride       Marvene Strohm, DO  [facility] Pager [phone]  If 7PM-7AM, please contact night-coverage [email or web address] Password [email or web address] 10/08/2013, 3:47 PM   LOS: 5 days

## 2013-10-08 NOTE — Progress Notes (Signed)
Physical Therapy Treatment Patient Details Name: Kaylee [name] MRN: [id number] DOB: [birth year] Today's Date: 10/08/2013 Time: 4098-1191 PT Time Calculation (min): 16 min  PT Assessment / Plan / Recommendation  History of Present Illness  Kaylee [name] is a 72 y.o. female was recently discharged from the hospital on 10/01/13 and returns on 10/03/13 due to complaints of dizziness in the upright position, anorexia and generalized weakness. 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 Her sister Ms. Kaylee [name] has been staying with her most of the time since hospital discharge. When home health PT went to evaluate her, they found her to be orthostatic, PCPs office was contacted and patient was advised to come to the ED for further management. In the ED, orthostatic hypotension was redemonstrated, sodium 129, chloride 94,    PT Comments   Pt reports no symptoms of dizziness with bed mobility however reports still having dizziness upon standing and ambulation.  Pt eager to ambulate today and reports walking "feels good."  Pt hopeful to d/c to SNF today.   Follow Up Recommendations  SNF     Does the patient have the potential to tolerate intense rehabilitation     Barriers to Discharge        Equipment Recommendations  Rolling walker with 5" wheels    Recommendations for Other Services    Frequency Min 3X/week   Progress towards PT Goals Progress towards PT goals: Progressing toward goals  Plan Current plan remains appropriate    Precautions / Restrictions Precautions Precautions: Fall Precaution Comments: orthostatic hypotension, R horizontal canal BPPV   Pertinent Vitals/Pain Per docflowsheets, continues to have orthostatic hypotension    Mobility  Bed Mobility Bed Mobility: Not assessed Details for Bed Mobility Assistance: pt up  in recliner on arrival Transfers Transfers: Sit to Stand;Stand to Sit Sit to Stand: With upper extremity assist;From chair/3-in-1;4: Min guard Stand to Sit: With upper extremity assist;To chair/3-in-1;4: Min guard Details for Transfer Assistance: verbal cue for hand placement upon initial rise Ambulation/Gait Ambulation/Gait Assistance: 4: Min guard Ambulation Distance (Feet): 70 Feet (x2) Assistive device: Rolling walker Ambulation/Gait Assistance Details: pt able to tolerate increase in distance today however still became dizzy requiring seated rest break Gait Pattern: Step-through pattern;Narrow base of support;Decreased stride length    Exercises     PT Diagnosis:    PT Problem List:   PT Treatment Interventions:     PT Goals (current goals can now be found in the care plan section)    Visit Information  Last PT Received On: 10/08/13 Assistance Needed: +2 (for recliner during ambulation) History of Present Illness:  Kaylee [name] is a 72 y.o. female was recently discharged from the hospital on 10/01/13 and returns on 10/03/13 due to complaints of dizziness in the upright position, anorexia and generalized weakness. 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 Her sister Ms. Kaylee [name] has been staying with her most of the time since hospital discharge. When home health PT went to evaluate her, they found her to be orthostatic, PCPs office was contacted and patient was advised to come to the ED for further management. In the ED, orthostatic hypotension was redemonstrated, sodium 129, chloride 94,     Subjective Data      Cognition  Cognition Arousal/Alertness: Awake/alert Behavior During Therapy:  WFL for tasks assessed/performed Overall Cognitive Status: Within Functional Limits for tasks assessed    Balance     End of Session PT -  End of Session Activity Tolerance: Other (comment) (limited by dizziness (orthostatic hypotension)) Patient left: with call bell/phone within reach;in chair   GP     Kaylee [name],Kaylee [name] 10/08/2013, 1:19 PM Kaylee [name], PT, DPT 10/08/2013 Pager: [phone]

## 2013-10-09 DIAGNOSIS — C649 Malignant neoplasm of unspecified kidney, except renal pelvis: Secondary | ICD-10-CM

## 2013-10-09 LAB — GLUCOSE, CAPILLARY
Glucose-Capillary: 143 mg/dL — ABNORMAL HIGH (ref 70–99)
Glucose-Capillary: 116 mg/dL — ABNORMAL HIGH (ref 70–99)
Glucose-Capillary: 123 mg/dL — ABNORMAL HIGH (ref 70–99)

## 2013-10-09 LAB — CBC
HCT: 29.3 % — ABNORMAL LOW (ref 36.0–46.0)
MCH: 25.7 pg — ABNORMAL LOW (ref 26.0–34.0)
MCHC: 31.7 g/dL (ref 30.0–36.0)
MCV: 80.9 fL (ref 78.0–100.0)
Platelets: 274 10*3/uL (ref 150–400)
RDW: 16.1 % — ABNORMAL HIGH (ref 11.5–15.5)
WBC: 9.9 10*3/uL (ref 4.0–10.5)

## 2013-10-09 LAB — HEPATIC FUNCTION PANEL
ALT: 7 U/L (ref 0–35)
Alkaline Phosphatase: 78 U/L (ref 39–117)
Bilirubin, Direct: <0.1 mg/dL (ref 0.0–0.3)
Total Bilirubin: 0.2 mg/dL — ABNORMAL LOW (ref 0.3–1.2)

## 2013-10-09 LAB — BASIC METABOLIC PANEL
CO2: 26 mEq/L (ref 19–32)
Calcium: 9.4 mg/dL (ref 8.4–10.5)
Creatinine, Ser: 0.83 mg/dL (ref 0.50–1.10)
Sodium: 137 mEq/L (ref 135–145)

## 2013-10-09 MED ORDER — MIDODRINE HCL 5 MG PO TABS
10.0000 mg | ORAL_TABLET | ORAL | Status: DC
  Administered 2013-10-09: 10 mg via ORAL
  Filled 2013-10-09 (×7): qty 2

## 2013-10-09 MED ORDER — ALBUMIN HUMAN 5 % IV SOLN
25.0000 g | Freq: Once | INTRAVENOUS | Status: AC
  Administered 2013-10-09: 16:00:00 25 g via INTRAVENOUS
  Filled 2013-10-09: qty 500

## 2013-10-09 MED ORDER — POTASSIUM CHLORIDE CRYS ER 20 MEQ PO TBCR
40.0000 meq | EXTENDED_RELEASE_TABLET | Freq: Once | ORAL | Status: AC
  Administered 2013-10-09: 16:00:00 40 meq via ORAL
  Filled 2013-10-09: qty 2

## 2013-10-09 NOTE — Progress Notes (Signed)
[facility] PROGRESS NOTE  Kaylee [name]:[id number] DOB: August 15, 1941 DOA: 10/03/2013 PCP: Kaylee Pounds, MD  Interim history  72 y.o. ?, known recent diag metastatic stg IV renal cell carcinoma,s/p cytoreductive nephrectomy  Recently discharged 10/01/13 returned 10/03/13 due to complaints of dizziness in the upright position, anorexia , generalized weakness.  During recent hospitalization between 12/6-12/10, presented with left flank pain, presyncope, anorexia and polyuria. She was found to have enlarging pulmonary and hepatic metastasis along with UTI. Liver biopsy which confirmed metastasis to the liver. MRI brain was negative for metastasis. She was discharged with outpatient followup with oncology and urology  ED=orthostatic hypotension was redemonstrated, sodium 129, chloride 94, glucose 155, creatinine 1.2, albumin 3.4, hemoglobin 11.1 and acute abdominal series did not show acute findings.  She was still orthostatic even after admission and was placed on IV saline 75 cc per hour. Patient was also given 1 g of IV albumin. Midodrine and Florinef have been maxed out. The patient remains orthostatic.  Pt canot tolerate salt tabs. Cardiology was consulted for any additional recommendation.   Assessment/Plan: Orthostatic hypotension  -Transfuse 500 cc albumin 12/17 and 12/18 -Restart IV NS -Increase midodrine to 10mg  tid  -Continue Florinef  -Liberalize diet  -Unable to tolerate soft tablets  -Check TSH--1.997 -Check a.m. Cortisol--pening -remains orthostatic 12/18  Benign positional vertigo  -PT felt patient had some benign positional vertigo symptoms  -Give a trial of meclizine if no improvement  Metastatic renal cell carcinoma, stage IV  -Status post cytoreductive nephrectomy November 2014  -Patient will followup with Dr. Berneice Heinrich after d/c  Impaired glucose tolerance  -Hemoglobin A1c  -Given clinical situation, would not restrict diet at this time  Anemia of chronic  disease  -Remains stable  -continue to monitor  Depression  - continue Paxil  COPD  -Stable  Hypokalemia -Replete -Check magnesium Family Communication: Pt at beside  Disposition Plan: Home when medically stable        Procedures/Studies: Ct Chest W Contrast  09/29/2013   CLINICAL DATA:  Cough. Lung cancer diagnosed 1985 status post right upper lobectomy. Left renal cancer diagnosed 2014 status post left nephrectomy.  EXAM: CT CHEST WITH CONTRAST  TECHNIQUE: Multidetector CT imaging of the chest was performed during intravenous contrast administration.  CONTRAST:  80mL OMNIPAQUE IOHEXOL 300 MG/ML  SOLN  COMPARISON:  CT abdomen pelvis dated 09/27/2013. PET-CT dated 08/06/2013.  FINDINGS: Status post right upper lobectomy.  Numerous bilateral pulmonary nodules/metastases throughout all lobes, progressed from prior PET-CT. Representative nodules include:  --12 x 10 mm nodule in the anterior left upper lobe (series 5/image 18), previously 6 mm  --9 x 8 mm nodule in the right middle lobe (series 5/image 20), previously 5 mm  --10 x 12 mm nodule at the right lung base (series 5/image 44), previously 6 mm  --13 x 17 mm nodule at the left lung base (series 5/image 49), previously 9 x 12 mm  Moderate paraseptal emphysematous changes. Left apical pleural parenchymal scarring. No pleural effusion or pneumothorax.  Visualized thyroid is unremarkable.  The heart is normal in size. No pericardial effusion. Mild coronary atherosclerosis. Atherosclerotic calcifications of the aortic arch.  Small mediastinal lymph nodes measuring up to 7 mm, within normal limits. No suspicious hilar or axillary lymphadenopathy.  Bilateral breast augmentation.  Visualized upper abdomen is notable for hepatic metastases (series 2/images 42 and 47), better evaluated on recent CT abdomen pelvis. Postsurgical changes/fluid related to prior left nephrectomy (series 2/image 58), incompletely visualized.  Mild degenerative changes  of  the visualized thoracolumbar spine.  IMPRESSION: Status post right upper lobectomy.  Numerous bilateral pulmonary nodules/metastases throughout all lobes, measuring up to 1.7 cm, progressed from recent prior PET-CT.  At least two hepatic metastases, better evaluated on recent CT abdomen/pelvis.  Status post left nephrectomy with associated postsurgical changes/fluid, incompletely visualized.   Electronically Signed   By: Charline Bills M.D.   On: 09/29/2013 11:21   [name] Contrast  09/30/2013   CLINICAL DATA:  History of renal cell carcinoma and lung cancer. Dizziness. Staging for intracranial metastatic disease.  EXAM: MRI HEAD WITHOUT AND WITH CONTRAST  TECHNIQUE: Multiplanar, multiecho pulse sequences of the brain and surrounding structures were obtained without and with intravenous contrast.  CONTRAST:  10mL MULTIHANCE GADOBENATE DIMEGLUMINE 529 MG/ML IV SOLN  COMPARISON:  None.  FINDINGS: No acute infarct.  No intracranial mass or abnormal enhancement.  No intracranial hemorrhage.  Mild small vessel disease type changes.  Atrophy without hydrocephalus.  Major intracranial vascular structures are patent.  Partial opacification mastoid air cells greater on left. No obstructing lesion posterior superior nasopharynx.  Cervical medullary junction, pituitary region, pineal region and orbital structures unremarkable.  IMPRESSION: No evidence of intracranial metastatic disease.  No acute infarct.  Small vessel disease type changes.  Global atrophy.  Partial opacification mastoid air cells greater on the left.   Electronically Signed   By: Bridgett Larsson M.D.   On: 09/30/2013 12:26   Ct Abdomen Pelvis W Contrast  09/27/2013   CLINICAL DATA:  Abdominal pain since left nephrectomy on 09/03/2013.  EXAM: CT ABDOMEN AND PELVIS WITH CONTRAST  TECHNIQUE: Multidetector CT imaging of the abdomen and pelvis was performed using the standard protocol following bolus administration of intravenous contrast.  CONTRAST:   50mL OMNIPAQUE IOHEXOL 300 MG/ML SOLN, 80mL OMNIPAQUE IOHEXOL 300 MG/ML SOLN  COMPARISON:  MRI of the abdomen dated 08/14/2013, PET-CT scan dated 08/06/2013, and CT scan abdomen dated 10/30/2010  FINDINGS: Patient has developed much more extensive pulmonary metastatic disease since the prior PET-CT of 08/06/2013. There are numerous bilateral pulmonary nodules at the lung bases. The largest nodule is at the left lung base posteriorly measuring 13 x 17 mm on image number 10 of series 2 signed report in. The largest nodule on the right is 12 x 10 mm on image number 11 of series 4.  The lesion in the anterior aspect left lobe of the liver has enlarged and now measures 27 x 19 mm. There is a new lesion in the dome of the liver measuring 9 mm.  Biliary tree appears normal. There is a 5 mm lymph node adjacent to the gallbladder on image number 31 of series 2.  Spleen, pancreas, right adrenal gland, and right kidney appear normal. The left adrenal gland is not visualized and may have been removed.  There is minimal postsurgical scar at the site of the previous nephrectomy.  The bowel appears normal.  There is extensive air in the subcutaneous fat superficial to the muscles of the left anterior and lateral abdominal wall. There is no pus collection. This amount of air in the subcutaneous fat is atypical.  There is a tiny amount of air in the bladder.  IMPRESSION: 1. Marked progression of pulmonary metastases and liver metastases since the prior PET-CT scan dated 08/06/2013. 2. Extensive air in the subcutaneous fat superficial to the muscles of the left side of the anterior lateral abdominal wall without fluid collections to suggest an abscess. This amount of air  is atypical. This is of unknown etiology. 3. Minimal postsurgical changes at the site of the previous nephrectomy.   Electronically Signed   By: Geanie Cooley M.D.   On: 09/27/2013 14:17   US Biopsy  09/29/2013   CLINICAL DATA:  History of left-sided renal cell  carcinoma, post left-sided nephrectomy, now with CT findings worrisome for progression / development of hepatic and pulmonary metastases. Please attempted ultrasound-guided liver lesion biopsy.  EXAM: ULTRASOUND BIOPSY CORE LIVER  COMPARISON:  Chest CT - earlier same day; CT abdomen pelvis -09/27/2013; PET-CT- 07/27/2013; abdominal MRI -08/14/2013  FINDINGS: Exhaustive ultrasound scanning was performed by the dictating radiologist however failed to definitively delineate the known dominant hyperenhancing mass involving the anterior segment of the dome of the right lobe of the liver, demonstrated on preprocedural imaging. As such, the patient was brought to CT for attempted CT-guided biopsy biopsy.  IMPRESSION: Known hyperenhancing dominant mass within the anterior segment of the dome of the right lobe of the liver is not apparent on ultrasound, as such, patient was escorted to CT to undergo attempted CT-guided biopsy.   Electronically Signed   By: Simonne Come M.D.   On: 09/29/2013 14:28   Ct Biopsy  09/29/2013   INDICATION: History of renal cell carcinoma, post nephrectomy, now with recent findings worrisome for progression of pulmonary and hepatic metastatic disease.  EXAM: CT BIOPSY OF HYPERVASCULAR MASS WITHIN THE DOME OF THE RIGHT LOBE OF THE LIVER  MEDICATIONS: Fentanyl 200 mcg IV; Versed 2 mg IV  ANESTHESIA/SEDATION: Sedation time  25 minutes  CONTRAST:  None  COMPARISON:  Chest CT - 09/29/2013; CT abdomen pelvis -09/27/2013; abdominal MRI -08/14/2013; PET-CT - 08/06/2013  PROCEDURE: Informed consent was obtained from the patient following an explanation of the procedure, risks, benefits and alternatives. A time out was performed prior to the initiation of the procedure.  The patient was positioned supine on the CT table and a limited CT was performed for procedural planning demonstrating grossly unchanged appearance of an approximately 1.6 x 1.3 cm hypo attenuating lesion within the anterior aspect of the  dome of the right lobe of the liver (image 2, series 2). Ultrasound scanning was again performed however again failed to delineate a definitive lesion at this location. As such, the procedure was attempted with CT fluoroscopic guidance.  The procedure was planned. The operative site was prepped and draped in the usual sterile fashion. Appropriate trajectory was confirmed with a 22 gauge spinal needle after the adjacent tissues were anesthetized with 1% Lidocaine with epinephrine. Under intermittent CT guidance, a 17 gauge coaxial needle was advanced into the peripheral aspect of the mass. Positioning was confirmed and 3 core needle biopsies were obtained with an 18 gauge core needle biopsy device. The co-axial needle was removed and hemostasis was achieved with manual compression.  A limited postprocedural CT was negative for hemorrhage or additional complication. A dressing was placed. The patient tolerated the procedure well without immediate postprocedural complication.  COMPLICATIONS: None immediate  IMPRESSION: Technically successful CT guided core needle biopsy ofdominant hypo attenuating lesion within the anterior aspect of the dome of the right lobe of the liver.   Electronically Signed   By: Simonne Come M.D.   On: 09/29/2013 15:53   Dg Abd Acute W/chest  10/03/2013   CLINICAL DATA:  Status post left nephrectomy on 09/15/2013, weakness, orthostatic hypotension, abdominal discomfort  EXAM: ACUTE ABDOMEN SERIES (ABDOMEN 2 VIEW & CHEST 1 VIEW)  COMPARISON:  CT chest dated 09/29/2013. CT  abdomen pelvis dated 09/29/2013.  FINDINGS: Nodularity in the lateral right mid lung, corresponding to suspected metastases on CT. Additional scattered left lung nodules are not well visualized radiographically. No pleural effusion or pneumothorax.  The heart is normal in size. Surgical clips in the right perihilar region.  Bilateral breast augmentation.  Nonobstructive bowel gas pattern.  Surgical clips in the left mid  abdomen related to prior nephrectomy.  No evidence of free air on the lateral decubitus view.  Mild degenerative changes of the visualized thoracolumbar spine.  IMPRESSION: Pulmonary nodularity in the right mid lung, better visualized on prior CT. Additional known left lung nodules are not well visualized radiographically.  No evidence of small bowel obstruction or free air.   Electronically Signed   By: Charline Bills M.D.   On: 10/03/2013 16:05         Subjective:  Overall the patient states her dizziness has improved about 40%. Denies any chest pain, shortness breath, nausea, vomiting, diarrhea, abdominal pain, dysuria, hematuria. No rashes.  Objective: Filed Vitals:   10/09/13 0541 10/09/13 0900 10/09/13 0904 10/09/13 0908  BP:  140/59 116/61 104/54  Pulse:  52 60 71  Temp:      TempSrc:      Resp:      Height:      Weight: 55.702 kg (122 lb 12.8 oz)     SpO2:        Intake/Output Summary (Last 24 hours) at 10/09/13 1629 Last data filed at 10/09/13 1600  Gross per 24 hour  Intake   3175 ml  Output   1800 ml  Net   1375 ml   Weight change: 3.102 kg (6 lb 13.4 oz) Exam:   General:  Pt is alert, follows commands appropriately, not in acute distress  HEENT: No icterus, No thrush,[place]/AT  Cardiovascular: RRR, S1/S2, no rubs, no gallops  Respiratory: CTA bilaterally, no wheezing, no crackles, no rhonchi  Abdomen: Soft/+BS, non tender, non distended, no guarding  Extremities: No edema, No lymphangitis, No petechiae, No rashes, no synovitis  Data Reviewed: Basic Metabolic Panel:  Recent Labs Lab 10/03/13 1505 10/04/13 0510 10/05/13 0536 10/09/13 0527  NA 129* 134* 137 137  K 4.3 4.3 4.0 3.1*  CL 94* 104 106 99  CO2 22 23 23 26   GLUCOSE 155* 113* 160* 158*  BUN 22 20 12 15   CREATININE 1.20* 1.00 0.94 0.83  CALCIUM 9.6 8.6 8.7 9.4  MG  --   --   --  1.9   Liver Function Tests:  Recent Labs Lab 10/03/13 1505 10/05/13 0536 10/09/13 0527  AST 13 12 11    ALT 8 7 7   ALKPHOS 112 88 78  BILITOT 0.4 0.2* 0.2*  PROT 8.2 6.5 6.6  ALBUMIN 3.4* 2.8* 3.2*   No results found for this basename: LIPASE, AMYLASE,  in the last 168 hours No results found for this basename: AMMONIA,  in the last 168 hours CBC:  Recent Labs Lab 10/03/13 1505 10/04/13 0510 10/05/13 0536 10/09/13 0527  WBC 11.5* 9.8 8.6 9.9  NEUTROABS 7.7  --   --   --   HGB 11.1* 9.7* 9.5* 9.3*  HCT 35.3* 30.3* 29.2* 29.3*  MCV 80.2 80.6 80.0 80.9  PLT 353 312 301 274   Cardiac Enzymes:  Recent Labs Lab 10/03/13 1505 10/04/13 1300  TROPONINI <0.30 <0.30   BNP: No components found with this basename: POCBNP,  CBG:  Recent Labs Lab 10/08/13 1142 10/08/13 1703 10/08/13 2027 10/09/13 0740  10/09/13 1151  GLUCAP 191* 160* 209* 143* 116*    Recent Results (from the past 240 hour(s))  URINE CULTURE     Status: None   Collection Time    10/04/13  3:28 AM      Result Value Range Status   Specimen Description URINE, CLEAN CATCH   Final   Special Requests NONE   Final   Culture  Setup Time     Final   Value: 10/04/2013 11:39     Performed at [facility] Count     Final   Value: >=100,000 COLONIES/ML     Performed at [facility]   Culture     Final   Value: ESCHERICHIA COLI     Performed at [facility]   Report Status 10/06/2013 FINAL   Final   Organism ID, Bacteria ESCHERICHIA COLI   Final     Scheduled Meds: . enoxaparin (LOVENOX) injection  40 mg Subcutaneous Q24H  . feeding supplement (ENSURE COMPLETE)  237 mL Oral TID BM  . fludrocortisone  0.3 mg Oral TID  . midodrine  10 mg Oral TID WC  . PARoxetine  20 mg Oral QPM  . sodium chloride  3 mL Intravenous Q12H   Continuous Infusions: . sodium chloride 75 mL/hr at 10/09/13 1537     Kemar Pandit, DO  [facility] Pager [phone]  If 7PM-7AM, please contact night-coverage [email or web address] Password [email or web address] 10/09/2013, 4:29 PM   LOS: 6 days

## 2013-10-09 NOTE — Consult Note (Signed)
CARDIOLOGY CONSULT NOTE  Patient ID: Kaylee [name], MRN: [id number], DOB/AGE: 74-Apr-1942 74 y.o. Admit date: 10/03/2013 Date of Consult: 10/09/2013  Primary Physician: Gwen Pounds, MD Primary Cardiologist: none Referring Physician: Dr Tat  Chief Complaint: Dizziness/Hypotension Reason for Consultation: Orthostatic Hypotension  HPI: 72 y.o. female w/ PMHx significant for metastatic renal cancer who presented to [facility] on 10/03/2013 with complaints of dizziness and weakness. She underwent left radical nephrectomy November 24. Since that time she has had anorexia, dizziness, and weakness. Her symptoms have progressed and have been refractory to standard therapies. It has been difficult for her to sit or stand because of severe hypotension. She feels very dizzy and has to lie down. She describes near syncope but has not had frank syncope or injury with this. She denies shortness of breath, chest pain, or edema. Her appetite has been very poor. She has been treated aggressively with Midodrine and Florinef. She has headaches after taking midodrine.  Past Medical History  Diagnosis Date  . Depression   . Back pain   . Lung cancer     s/p resection  . COPD (chronic obstructive pulmonary disease)   . Headache(784.0)     HX MIGRAINES   . GERD (gastroesophageal reflux disease)   . Renal cell cancer Left  . Frequency of urination   . Urgency of urination   . Nocturia   . Hernia     L LOWER ABD  . Difficulty sleeping       Surgical History:  Past Surgical History  Procedure Laterality Date  . Lung surgery  1985    RT UPPER LOBE REMOVED FOR CANCER - NOT FRUTHER TX  . Breast surgery  1988    AUGMENTATION  . Back surgery  1985  . Abdominal hysterectomy  1970  . Hand surgery      LEFT  . Robot assisted laparoscopic nephrectomy Left 09/15/2013    Procedure: ROBOTIC ASSISTED LAPAROSCOPIC NEPHRECTOMY;  Surgeon: Sebastian Ache, MD;  Location: [facility];  Service:  Urology;  Laterality: Left;     Home Meds: Prior to Admission medications   Medication Sig Start Date End Date Taking? Authorizing Provider  ALPRAZolam (XANAX) 0.25 MG tablet Take 0.25 mg by mouth 2 (two) times daily as needed for anxiety or sleep.    Yes Historical Provider, MD  ciprofloxacin (CIPRO) 500 MG tablet Take 500 mg by mouth 2 (two) times daily. 09/30/13  Yes Christina P Rama, MD  omeprazole (PRILOSEC) 40 MG capsule Take 40 mg by mouth daily.   Yes Historical Provider, MD  oxyCODONE-acetaminophen (PERCOCET/ROXICET) 5-325 MG per tablet Take 1 tablet by mouth every 4 (four) hours as needed for severe pain. Postoperatively. 09/17/13  Yes Sebastian Ache, MD  PARoxetine (PAXIL) 20 MG tablet Take 20 mg by mouth every evening.   Yes Historical Provider, MD  polyethylene glycol (MIRALAX / GLYCOLAX) packet Take 17 g by mouth daily.   Yes Historical Provider, MD  senna-docusate (SENOKOT-S) 8.6-50 MG per tablet Take 1 tablet by mouth 2 (two) times daily. While taking pain meds to prevent constipation. 09/17/13  Yes Sebastian Ache, MD    Inpatient Medications:  . enoxaparin (LOVENOX) injection  40 mg Subcutaneous Q24H  . feeding supplement (ENSURE COMPLETE)  237 mL Oral TID BM  . fludrocortisone  0.3 mg Oral TID  . midodrine  10 mg Oral TID WC  . PARoxetine  20 mg Oral QPM  . sodium chloride  3 mL Intravenous Q12H   .  sodium chloride 75 mL/hr at 10/09/13 1537    Allergies:  Allergies  Allergen Reactions  . Penicillins     unknown  . Codeine Swelling and Rash    History   Social History  . Marital Status: Widowed    Spouse Name: N/A    Number of Children: 1  . Years of Education: 12   Occupational History  . Prior Doctor, general practice work    Social History Main Topics  . Smoking status: Current Every Day Smoker -- 0.50 packs/day for 54 years    Types: Cigarettes  . Smokeless tobacco: Current User  . Alcohol Use: No  . Drug Use: No  . Sexual Activity: No   Other Topics  Concern  . Not on file   Social History Narrative   Widowed since July 2009.  Lives alone.  Ambulates without assistance.      Family History  Problem Relation Age of Onset  . Cancer Mother     Melanoma  . Heart disease Father   . Stroke Father   . Cancer Maternal Grandmother     Colon cancer  . Cancer Brother     Colon cancer  . Cancer Sister     Adrenal gland cancer     Review of Systems: General: negative for chills, fever, night sweats. Positive for weight loss  ENT: negative for rhinorrhea or epistaxis Cardiovascular: negative for chest pain, shortness of breath, dyspnea on exertion, edema, orthopnea, palpitations, or paroxysmal nocturnal dyspnea Dermatological: negative for rash Respiratory: negative for cough or wheezing GI: Positive for nausea, anorexia. GU: no hematuria, urgency, or frequency Neurologic: negative for visual changes. Positive for headache and dizziness Heme: no easy bruising or bleeding Endo: negative for excessive thirst, thyroid disorder, or flushing Musculoskeletal: negative for joint pain or swelling, negative for myalgias All other systems reviewed and are otherwise negative except as noted above.  Physical Exam: Blood pressure 104/54, pulse 71, temperature 98.2 F (36.8 C), temperature source Oral, resp. rate 18, height 5\' 7"  (1.702 m), weight 122 lb 12.8 oz (55.702 kg), SpO2 95.00%. General: Pleasant, thin woman, alert and oriented, in no acute distress. HEENT: Normocephalic, atraumatic, sclera non-icteric, no xanthomas, nares are without discharge.  Neck: Supple. Carotids 2+ without bruits. JVP normal Lungs: Clear bilaterally to auscultation without wheezes, rales, or rhonchi. Breathing is unlabored. Heart: RRR with normal S1 and S2. No murmurs, rubs, or gallops appreciated. Abdomen: Soft, non-tender, non-distended with normoactive bowel sounds. No hepatomegaly. No rebound/guarding. Surgical incisions healing well Back: No CVA  tenderness Msk:  Strength and tone appear normal for age. Extremities: No clubbing, cyanosis, or edema.  Distal pedal pulses are 2+ and equal bilaterally. Neuro: CNII-XII intact, moves all extremities spontaneously. Psych:  Responds to questions appropriately with a normal affect.    Labs: No results found for this basename: CKTOTAL, CKMB, TROPONINI,  in the last 72 hours Lab Results  Component Value Date   WBC 9.9 10/09/2013   HGB 9.3* 10/09/2013   HCT 29.3* 10/09/2013   MCV 80.9 10/09/2013   PLT 274 10/09/2013    Recent Labs Lab 10/09/13 0527  NA 137  K 3.1*  CL 99  CO2 26  BUN 15  CREATININE 0.83  CALCIUM 9.4  PROT 6.6  BILITOT 0.2*  ALKPHOS 78  ALT 7  AST 11  GLUCOSE 158*   Lab Results  Component Value Date   CHOL 204* 12/22/2008   HDL 26.0* 12/22/2008   TRIG 191* 12/22/2008   No results  found for this basename: DDIMER    Radiology/Studies:  Ct Chest W Contrast  09/29/2013   CLINICAL DATA:  Cough. Lung cancer diagnosed 1985 status post right upper lobectomy. Left renal cancer diagnosed 2014 status post left nephrectomy.  EXAM: CT CHEST WITH CONTRAST  TECHNIQUE: Multidetector CT imaging of the chest was performed during intravenous contrast administration.  CONTRAST:  80mL OMNIPAQUE IOHEXOL 300 MG/ML  SOLN  COMPARISON:  CT abdomen pelvis dated 09/27/2013. PET-CT dated 08/06/2013.  FINDINGS: Status post right upper lobectomy.  Numerous bilateral pulmonary nodules/metastases throughout all lobes, progressed from prior PET-CT. Representative nodules include:  --12 x 10 mm nodule in the anterior left upper lobe (series 5/image 18), previously 6 mm  --9 x 8 mm nodule in the right middle lobe (series 5/image 20), previously 5 mm  --10 x 12 mm nodule at the right lung base (series 5/image 44), previously 6 mm  --13 x 17 mm nodule at the left lung base (series 5/image 49), previously 9 x 12 mm  Moderate paraseptal emphysematous changes. Left apical pleural parenchymal scarring. No  pleural effusion or pneumothorax.  Visualized thyroid is unremarkable.  The heart is normal in size. No pericardial effusion. Mild coronary atherosclerosis. Atherosclerotic calcifications of the aortic arch.  Small mediastinal lymph nodes measuring up to 7 mm, within normal limits. No suspicious hilar or axillary lymphadenopathy.  Bilateral breast augmentation.  Visualized upper abdomen is notable for hepatic metastases (series 2/images 42 and 47), better evaluated on recent CT abdomen pelvis. Postsurgical changes/fluid related to prior left nephrectomy (series 2/image 58), incompletely visualized.  Mild degenerative changes of the visualized thoracolumbar spine.  IMPRESSION: Status post right upper lobectomy.  Numerous bilateral pulmonary nodules/metastases throughout all lobes, measuring up to 1.7 cm, progressed from recent prior PET-CT.  At least two hepatic metastases, better evaluated on recent CT abdomen/pelvis.  Status post left nephrectomy with associated postsurgical changes/fluid, incompletely visualized.   Electronically Signed   By: Charline Bills M.D.   On: 09/29/2013 11:21   Mr Laqueta Jean WU Contrast  09/30/2013   CLINICAL DATA:  History of renal cell carcinoma and lung cancer. Dizziness. Staging for intracranial metastatic disease.  EXAM: MRI HEAD WITHOUT AND WITH CONTRAST  TECHNIQUE: Multiplanar, multiecho pulse sequences of the brain and surrounding structures were obtained without and with intravenous contrast.  CONTRAST:  10mL MULTIHANCE GADOBENATE DIMEGLUMINE 529 MG/ML IV SOLN  COMPARISON:  None.  FINDINGS: No acute infarct.  No intracranial mass or abnormal enhancement.  No intracranial hemorrhage.  Mild small vessel disease type changes.  Atrophy without hydrocephalus.  Major intracranial vascular structures are patent.  Partial opacification mastoid air cells greater on left. No obstructing lesion posterior superior nasopharynx.  Cervical medullary junction, pituitary region, pineal region  and orbital structures unremarkable.  IMPRESSION: No evidence of intracranial metastatic disease.  No acute infarct.  Small vessel disease type changes.  Global atrophy.  Partial opacification mastoid air cells greater on the left.   Electronically Signed   By: Bridgett Larsson M.D.   On: 09/30/2013 12:26   Ct Abdomen Pelvis W Contrast  09/27/2013   CLINICAL DATA:  Abdominal pain since left nephrectomy on 09/03/2013.  EXAM: CT ABDOMEN AND PELVIS WITH CONTRAST  TECHNIQUE: Multidetector CT imaging of the abdomen and pelvis was performed using the standard protocol following bolus administration of intravenous contrast.  CONTRAST:  50mL OMNIPAQUE IOHEXOL 300 MG/ML SOLN, 80mL OMNIPAQUE IOHEXOL 300 MG/ML SOLN  COMPARISON:  MRI of the abdomen dated 08/14/2013, PET-CT scan dated  08/06/2013, and CT scan abdomen dated 10/30/2010  FINDINGS: Patient has developed much more extensive pulmonary metastatic disease since the prior PET-CT of 08/06/2013. There are numerous bilateral pulmonary nodules at the lung bases. The largest nodule is at the left lung base posteriorly measuring 13 x 17 mm on image number 10 of series 2 signed report in. The largest nodule on the right is 12 x 10 mm on image number 11 of series 4.  The lesion in the anterior aspect left lobe of the liver has enlarged and now measures 27 x 19 mm. There is a new lesion in the dome of the liver measuring 9 mm.  Biliary tree appears normal. There is a 5 mm lymph node adjacent to the gallbladder on image number 31 of series 2.  Spleen, pancreas, right adrenal gland, and right kidney appear normal. The left adrenal gland is not visualized and may have been removed.  There is minimal postsurgical scar at the site of the previous nephrectomy.  The bowel appears normal.  There is extensive air in the subcutaneous fat superficial to the muscles of the left anterior and lateral abdominal wall. There is no pus collection. This amount of air in the subcutaneous fat is  atypical.  There is a tiny amount of air in the bladder.  IMPRESSION: 1. Marked progression of pulmonary metastases and liver metastases since the prior PET-CT scan dated 08/06/2013. 2. Extensive air in the subcutaneous fat superficial to the muscles of the left side of the anterior lateral abdominal wall without fluid collections to suggest an abscess. This amount of air is atypical. This is of unknown etiology. 3. Minimal postsurgical changes at the site of the previous nephrectomy.   Electronically Signed   By: Geanie Cooley M.D.   On: 09/27/2013 14:17   US Biopsy  09/29/2013   CLINICAL DATA:  History of left-sided renal cell carcinoma, post left-sided nephrectomy, now with CT findings worrisome for progression / development of hepatic and pulmonary metastases. Please attempted ultrasound-guided liver lesion biopsy.  EXAM: ULTRASOUND BIOPSY CORE LIVER  COMPARISON:  Chest CT - earlier same day; CT abdomen pelvis -09/27/2013; PET-CT- 07/27/2013; abdominal MRI -08/14/2013  FINDINGS: Exhaustive ultrasound scanning was performed by the dictating radiologist however failed to definitively delineate the known dominant hyperenhancing mass involving the anterior segment of the dome of the right lobe of the liver, demonstrated on preprocedural imaging. As such, the patient was brought to CT for attempted CT-guided biopsy biopsy.  IMPRESSION: Known hyperenhancing dominant mass within the anterior segment of the dome of the right lobe of the liver is not apparent on ultrasound, as such, patient was escorted to CT to undergo attempted CT-guided biopsy.   Electronically Signed   By: Simonne Come M.D.   On: 09/29/2013 14:28   Ct Biopsy  09/29/2013   INDICATION: History of renal cell carcinoma, post nephrectomy, now with recent findings worrisome for progression of pulmonary and hepatic metastatic disease.  EXAM: CT BIOPSY OF HYPERVASCULAR MASS WITHIN THE DOME OF THE RIGHT LOBE OF THE LIVER  MEDICATIONS: Fentanyl 200 mcg IV;  Versed 2 mg IV  ANESTHESIA/SEDATION: Sedation time  25 minutes  CONTRAST:  None  COMPARISON:  Chest CT - 09/29/2013; CT abdomen pelvis -09/27/2013; abdominal MRI -08/14/2013; PET-CT - 08/06/2013  PROCEDURE: Informed consent was obtained from the patient following an explanation of the procedure, risks, benefits and alternatives. A time out was performed prior to the initiation of the procedure.  The patient was positioned supine on  the CT table and a limited CT was performed for procedural planning demonstrating grossly unchanged appearance of an approximately 1.6 x 1.3 cm hypo attenuating lesion within the anterior aspect of the dome of the right lobe of the liver (image 2, series 2). Ultrasound scanning was again performed however again failed to delineate a definitive lesion at this location. As such, the procedure was attempted with CT fluoroscopic guidance.  The procedure was planned. The operative site was prepped and draped in the usual sterile fashion. Appropriate trajectory was confirmed with a 22 gauge spinal needle after the adjacent tissues were anesthetized with 1% Lidocaine with epinephrine. Under intermittent CT guidance, a 17 gauge coaxial needle was advanced into the peripheral aspect of the mass. Positioning was confirmed and 3 core needle biopsies were obtained with an 18 gauge core needle biopsy device. The co-axial needle was removed and hemostasis was achieved with manual compression.  A limited postprocedural CT was negative for hemorrhage or additional complication. A dressing was placed. The patient tolerated the procedure well without immediate postprocedural complication.  COMPLICATIONS: None immediate  IMPRESSION: Technically successful CT guided core needle biopsy ofdominant hypo attenuating lesion within the anterior aspect of the dome of the right lobe of the liver.   Electronically Signed   By: Simonne Come M.D.   On: 09/29/2013 15:53   Dg Abd Acute W/chest  10/03/2013   CLINICAL  DATA:  Status post left nephrectomy on 09/15/2013, weakness, orthostatic hypotension, abdominal discomfort  EXAM: ACUTE ABDOMEN SERIES (ABDOMEN 2 VIEW & CHEST 1 VIEW)  COMPARISON:  CT chest dated 09/29/2013. CT abdomen pelvis dated 09/29/2013.  FINDINGS: Nodularity in the lateral right mid lung, corresponding to suspected metastases on CT. Additional scattered left lung nodules are not well visualized radiographically. No pleural effusion or pneumothorax.  The heart is normal in size. Surgical clips in the right perihilar region.  Bilateral breast augmentation.  Nonobstructive bowel gas pattern.  Surgical clips in the left mid abdomen related to prior nephrectomy.  No evidence of free air on the lateral decubitus view.  Mild degenerative changes of the visualized thoracolumbar spine.  IMPRESSION: Pulmonary nodularity in the right mid lung, better visualized on prior CT. Additional known left lung nodules are not well visualized radiographically.  No evidence of small bowel obstruction or free air.   Electronically Signed   By: Charline Bills M.D.   On: 10/03/2013 16:05    EKG: 10/04/2013: Sinus rhythm, within normal limits  ASSESSMENT AND PLAN:  Severe orthostatic hypotension. I have carefully reviewed the patient's clinical history, radiographic data, electrocardiogram, and lab results. I reviewed her vital signs history and there are marked hypotensive episodes with standing. Unfortunately she seems to be refractory to pharmacotherapy. She is unable to tolerate compression stockings. Midodrine can be increased to every 4 hour dosing while awake. Would continue florinef at the current dose. Will help to sit up as much as she can tolerate and also to place bed in reverse Trendelenburg position. Hopefully as she gets farther out from surgery, this problem will improve. Her anorexia is probably contributing as well. It is possible that this could be a manifestation of autonomic insufficiency related to her  recent surgery or a paraneoplastic syndrome related to her metastatic renal cell cancer. Regardless of etiology, I don't think we have much to offer except for interventions mentioned above.  Enzo Bi  10/09/2013, 5:20 PM

## 2013-10-09 NOTE — Progress Notes (Signed)
Physical Therapy Treatment Patient Details Name: Kaylee [name] MRN: [id number] DOB: 1941/03/26 Today's Date: 10/09/2013 Time: 4098-1191 PT Time Calculation (min): 26 min  PT Assessment / Plan / Recommendation  History of Present Illness  Kaylee [name] is a 72 y.o. female was recently discharged from the hospital on 10/01/13 and returns on 10/03/13 due to complaints of dizziness in the upright position, anorexia and generalized weakness. 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 Her sister [name] has been staying with her most of the time since hospital discharge. When home health PT went to evaluate her, they found her to be orthostatic, PCPs office was contacted and patient was advised to come to the ED for further management. In the ED, orthostatic hypotension was redemonstrated, sodium 129, chloride 94,    PT Comments   Assisted pt OOB to amb to BR then amb in hallway limited distance.  Pt c/o mild dizziness.  VC's to focus eyes forward.  Pt will need ST Rehab at SNF prior to D/C to home.   Follow Up Recommendations  SNF     Does the patient have the potential to tolerate intense rehabilitation     Barriers to Discharge        Equipment Recommendations       Recommendations for Other Services    Frequency Min 3X/week   Progress towards PT Goals Progress towards PT goals: Progressing toward goals  Plan      Precautions / Restrictions Precautions Precautions: Fall    Pertinent Vitals/Pain No c/o pain    Mobility  Bed Mobility Bed Mobility: Supine to Sit Supine to Sit: 5: Supervision Details for Bed Mobility Assistance: increased time Transfers Transfers: Sit to Stand;Stand to Sit Sit to Stand: With upper extremity assist;From bed;From toilet;4: Min assist Stand to Sit: 4: Min assist;To toilet Details for  Transfer Assistance: verbal cue for hand placement upon initial rise Ambulation/Gait Ambulation/Gait Assistance: 4: Min assist Ambulation Distance (Feet): 35 Feet Assistive device: Rolling walker Ambulation/Gait Assistance Details: Pt c/o mild dizzyness.  VC's to focus eye forward and VC's on proper use RW as pt stated she was not use to using one but did like the increased support. Gait Pattern: Step-through pattern;Narrow base of support;Decreased stride length General Gait Details: HIGH FALL RISK    Exercises     PT Diagnosis:    PT Problem List:   PT Treatment Interventions:     PT Goals (current goals can now be found in the care plan section)    Visit Information  Last PT Received On: 10/09/13 History of Present Illness:  Donnamae KADIA [name] is a 72 y.o. female was recently discharged from the hospital on 10/01/13 and returns on 10/03/13 due to complaints of dizziness in the upright position, anorexia and generalized weakness. 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 Her sister [name] has been staying with her most of the time since hospital discharge. When home health PT went to evaluate her, they found her to be orthostatic, PCPs office was contacted and patient was advised to come to the ED for further management. In the ED, orthostatic hypotension was redemonstrated, sodium 129, chloride 94,     Subjective Data      Cognition       Balance  End of Session PT - End of Session Equipment Utilized During Treatment: Gait belt   Felecia Shelling  PTA [facility]  Acute  Rehab Pager      [phone]

## 2013-10-10 DIAGNOSIS — R630 Anorexia: Secondary | ICD-10-CM

## 2013-10-10 LAB — HEMOGLOBIN A1C: Hgb A1c MFr Bld: 6.8 % — ABNORMAL HIGH (ref <5.7)

## 2013-10-10 LAB — MAGNESIUM: Magnesium: 1.8 mg/dL (ref 1.5–2.5)

## 2013-10-10 LAB — BASIC METABOLIC PANEL
Chloride: 103 mEq/L (ref 96–112)
Creatinine, Ser: 0.79 mg/dL (ref 0.50–1.10)
GFR calc Af Amer: >90 mL/min (ref >90)
GFR calc non Af Amer: 80 mL/min — ABNORMAL LOW (ref >90)
Potassium: 3.5 mEq/L (ref 3.5–5.1)
Sodium: 138 mEq/L (ref 135–145)

## 2013-10-10 LAB — GLUCOSE, CAPILLARY
Glucose-Capillary: 93 mg/dL (ref 70–99)
Glucose-Capillary: 183 mg/dL — ABNORMAL HIGH (ref 70–99)

## 2013-10-10 MED ORDER — FLUDROCORTISONE ACETATE 0.1 MG PO TABS: 0.3000 mg | ORAL_TABLET | Freq: Three times a day (TID) | ORAL | Status: DC

## 2013-10-10 MED ORDER — ALPRAZOLAM 0.25 MG PO TABS: 0.2500 mg | ORAL_TABLET | Freq: Two times a day (BID) | ORAL | Status: DC | PRN

## 2013-10-10 MED ORDER — MIDODRINE HCL 10 MG PO TABS: 10.0000 mg | ORAL_TABLET | ORAL | Status: DC

## 2013-10-10 NOTE — Progress Notes (Addendum)
Patient cleared for discharge. Packet copied and placed in [place]. Patient is agreeable to transfer to [facility] and glad to be getting out of the hospital. Her sister is going to transport her.  Kaylee [name] MSW, LCSW [phone]

## 2013-10-10 NOTE — Clinical Social Work Placement (Signed)
     Clinical Social Work Department CLINICAL SOCIAL WORK PLACEMENT NOTE 10/10/2013  Patient:  Kaylee [name], Kaylee [name]  Account Number:  [id number] Admit date:  10/03/2013  Clinical Social Worker:  Doroteo Glassman  Date/time:  10/05/2013 03:36 PM  Clinical Social Work is seeking post-discharge placement for this patient at the following level of care:   SKILLED NURSING   (*CSW will update this form in Epic as items are completed)   10/05/2013  Patient/family provided with [facility] Department of Clinical Social Works list of facilities offering this level of care within the geographic area requested by the patient (or if unable, by the patients family).  10/05/2013  Patient/family informed of their freedom to choose among providers that offer the needed level of care, that participate in Medicare, Medicaid or managed care program needed by the patient, have an available bed and are willing to accept the patient.  10/05/2013  Patient/family informed of [facility] ownership interest in [facility], as well as of the fact that they are under no obligation to receive care at this facility.  PASARR submitted to EDS on 10/05/2013 PASARR number received from EDS on 10/05/2013  FL2 transmitted to all facilities in geographic area requested by pt/family on  10/05/2013 FL2 transmitted to all facilities within larger geographic area on   Patient informed that his/her managed care company has contracts with or will negotiate with  certain facilities, including the following:     Patient/family informed of bed offers received:  10/07/2013 Patient chooses bed at [facility] Physician recommends and patient chooses bed at    Patient to be transferred to [facility] on  10/10/2013 Patient to be transferred to facility by sister  The following physician request were entered in Epic:   Additional Comments:

## 2013-10-10 NOTE — Discharge Summary (Signed)
Physician Discharge Summary  Kaylee [name] Both WUJ:[id number] DOB: [birth year] DOA: 10/03/2013  PCP: Kaylee Pounds, MD  Admit date: 10/03/2013 Discharge date: 10/10/2013  Time spent: 45 minutes  Recommendations for Outpatient Follow-up:  -Will be discharged to SNF today. -Will need continued PT for her orthostatic hypotension.   Discharge Diagnoses:  Principal Problem:   Orthostatic hypotension,severe and debilitatiing Active Problems:   DIABETES MELLITUS, TYPE II   HYPERLIPIDEMIA   Depressive disorder, not elsewhere classified   GERD   BACKACHE NOS   Urinary tract infection   Renal cell carcinoma   Dizziness   Anorexia   Abdominal pain   Normocytic anemia   Protein-calorie malnutrition, severe   Dehydration with hyponatremia   GI bleed   Severe protein-calorie malnutrition   Discharge Condition: Stable and Improved  Filed Weights   10/08/13 0442 10/09/13 0541 10/10/13 0507  Weight: 52.6 kg (115 lb 15.4 oz) 55.702 kg (122 lb 12.8 oz) 55.792 kg (123 lb)    History of present illness:  72 y.o. female was recently discharged from the hospital on 10/01/13 and returns on 10/03/13 due to complaints of dizziness in the upright position, anorexia and generalized weakness. Patient has history of recently diagnosed renal cell carcinoma/renal vein involvement, status post left radical nephrectomy on 09/15/13, depression, anxiety, COPD, DM 2. During recent hospitalization between 12/6-12/10, she presented with left flank pain, presyncope, anorexia and polyuria. She was found to have enlarging pulmonary and hepatic metastasis along with UTI. She underwent a liver biopsy which confirmed metastasis to the liver. MRI brain was negative for metastasis. She was discharged with outpatient followup with oncology and urology. Since discharge, patient has continued to have anorexia but denies nausea, vomiting or diarrhea. No fevers. Occasional chills. She complains of dizziness in the upright  position but denies syncope or falls. Her sister Ms. Kaylee [name] has been staying with her most of the time since hospital discharge. She complains of some abdominal pain only on coughing or movement attempts such as getting up from bed. Today when home health PT went to evaluate her, they found her to be orthostatic, PCPs office was contacted and patient was advised to come to the ED for further management. In the ED, orthostatic hypotension was redemonstrated, sodium 129, chloride 94, glucose 155, creatinine 1.2, albumin 3.4, hemoglobin 11.1 and acute abdominal series did not show acute findings. Hospitalist admission was requested.   Hospital Course:   Orthostatic Hypotension -On max doses of midodrine and florinef. -Refuses to wear compression stockings. -Is still orthostatic by VS parameters, but states her dizziness upon standing has improved. -Has been instructed on physical techniques to try to ameliorate her symptoms of orthostasis. -Has been seen by cardiology without further recommendations.  Metastatic renal cell carcinoma, stage IV  -Status post cytoreductive nephrectomy November 2014  -Patient will followup with Dr. Berneice [name] after d/c   Impaired glucose tolerance  -Given clinical situation, would not restrict diet at this time   Anemia of chronic disease  -Remains stable  -continue to monitor   Depression  - continue Paxil   COPD  -Stable   Hypokalemia  -Repleted    Procedures:  None   Consultations:  Cardiology  Discharge Instructions  Discharge Orders   Future Appointments Provider Department Dept Phone   10/21/2013 9:00 AM Kaylee Delay, MD [facility] [phone]   Future Orders Complete By Expires   Discontinue IV  As directed    Increase activity slowly  As directed  Medication List    STOP taking these medications       ciprofloxacin 500 MG tablet  Commonly known as:  CIPRO     oxyCODONE-acetaminophen 5-325 MG  per tablet  Commonly known as:  PERCOCET/ROXICET      TAKE these medications       ALPRAZolam 0.25 MG tablet  Commonly known as:  XANAX  Take 0.25 mg by mouth 2 (two) times daily as needed for anxiety or sleep.     fludrocortisone 0.1 MG tablet  Commonly known as:  FLORINEF  Take 3 tablets (0.3 mg total) by mouth 3 (three) times daily.     midodrine 10 MG tablet  Commonly known as:  PROAMATINE  Take 1 tablet (10 mg total) by mouth every 4 (four) hours while awake.     omeprazole 40 MG capsule  Commonly known as:  PRILOSEC  Take 40 mg by mouth daily.     PARoxetine 20 MG tablet  Commonly known as:  PAXIL  Take 20 mg by mouth every evening.     polyethylene glycol packet  Commonly known as:  MIRALAX / GLYCOLAX  Take 17 g by mouth daily.     senna-docusate 8.6-50 MG per tablet  Commonly known as:  Senokot-S  Take 1 tablet by mouth 2 (two) times daily. While taking pain meds to prevent constipation.       Allergies  Allergen Reactions  . Penicillins     unknown  . Codeine Swelling and Rash       Follow-up Information   Follow up with [facility]. [facility])    Contact information:   [address] [place] [address] [phone]        The results of significant diagnostics from this hospitalization (including imaging, microbiology, ancillary and laboratory) are listed below for reference.    Significant Diagnostic Studies: Ct Chest W Contrast  09/29/2013   CLINICAL DATA:  Cough. Lung cancer diagnosed 1985 status post right upper lobectomy. Left renal cancer diagnosed 2014 status post left nephrectomy.  EXAM: CT CHEST WITH CONTRAST  TECHNIQUE: Multidetector CT imaging of the chest was performed during intravenous contrast administration.  CONTRAST:  80mL OMNIPAQUE IOHEXOL 300 MG/ML  SOLN  COMPARISON:  CT abdomen pelvis dated 09/27/2013. PET-CT dated 08/06/2013.  FINDINGS: Status post right upper lobectomy.  Numerous bilateral  pulmonary nodules/metastases throughout all lobes, progressed from prior PET-CT. Representative nodules include:  --12 x 10 mm nodule in the anterior left upper lobe (series 5/image 18), previously 6 mm  --9 x 8 mm nodule in the right middle lobe (series 5/image 20), previously 5 mm  --10 x 12 mm nodule at the right lung base (series 5/image 44), previously 6 mm  --13 x 17 mm nodule at the left lung base (series 5/image 49), previously 9 x 12 mm  Moderate paraseptal emphysematous changes. Left apical pleural parenchymal scarring. No pleural effusion or pneumothorax.  Visualized thyroid is unremarkable.  The heart is normal in size. No pericardial effusion. Mild coronary atherosclerosis. Atherosclerotic calcifications of the aortic arch.  Small mediastinal lymph nodes measuring up to 7 mm, within normal limits. No suspicious hilar or axillary lymphadenopathy.  Bilateral breast augmentation.  Visualized upper abdomen is notable for hepatic metastases (series 2/images 42 and 47), better evaluated on recent CT abdomen pelvis. Postsurgical changes/fluid related to prior left nephrectomy (series 2/image 58), incompletely visualized.  Mild degenerative changes of the visualized thoracolumbar spine.  IMPRESSION: Status post right upper lobectomy.  Numerous bilateral pulmonary nodules/metastases throughout all lobes, measuring up to 1.7 cm, progressed from recent prior PET-CT.  At least two hepatic metastases, better evaluated on recent CT abdomen/pelvis.  Status post left nephrectomy with associated postsurgical changes/fluid, incompletely visualized.   Electronically Signed   By: Charline Bills M.D.   On: 09/29/2013 11:21   Mr Laqueta Jean XB Contrast  09/30/2013   CLINICAL DATA:  History of renal cell carcinoma and lung cancer. Dizziness. Staging for intracranial metastatic disease.  EXAM: MRI HEAD WITHOUT AND WITH CONTRAST  TECHNIQUE: Multiplanar, multiecho pulse sequences of the brain and surrounding structures were  obtained without and with intravenous contrast.  CONTRAST:  10mL MULTIHANCE GADOBENATE DIMEGLUMINE 529 MG/ML IV SOLN  COMPARISON:  None.  FINDINGS: No acute infarct.  No intracranial mass or abnormal enhancement.  No intracranial hemorrhage.  Mild small vessel disease type changes.  Atrophy without hydrocephalus.  Major intracranial vascular structures are patent.  Partial opacification mastoid air cells greater on left. No obstructing lesion posterior superior nasopharynx.  Cervical medullary junction, pituitary region, pineal region and orbital structures unremarkable.  IMPRESSION: No evidence of intracranial metastatic disease.  No acute infarct.  Small vessel disease type changes.  Global atrophy.  Partial opacification mastoid air cells greater on the left.   Electronically Signed   By: Bridgett Larsson M.D.   On: 09/30/2013 12:26   Ct Abdomen Pelvis W Contrast  09/27/2013   CLINICAL DATA:  Abdominal pain since left nephrectomy on 09/03/2013.  EXAM: CT ABDOMEN AND PELVIS WITH CONTRAST  TECHNIQUE: Multidetector CT imaging of the abdomen and pelvis was performed using the standard protocol following bolus administration of intravenous contrast.  CONTRAST:  50mL OMNIPAQUE IOHEXOL 300 MG/ML SOLN, 80mL OMNIPAQUE IOHEXOL 300 MG/ML SOLN  COMPARISON:  MRI of the abdomen dated 08/14/2013, PET-CT scan dated 08/06/2013, and CT scan abdomen dated 10/30/2010  FINDINGS: Patient has developed much more extensive pulmonary metastatic disease since the prior PET-CT of 08/06/2013. There are numerous bilateral pulmonary nodules at the lung bases. The largest nodule is at the left lung base posteriorly measuring 13 x 17 mm on image number 10 of series 2 signed report in. The largest nodule on the right is 12 x 10 mm on image number 11 of series 4.  The lesion in the anterior aspect left lobe of the liver has enlarged and now measures 27 x 19 mm. There is a new lesion in the dome of the liver measuring 9 mm.  Biliary tree appears  normal. There is a 5 mm lymph node adjacent to the gallbladder on image number 31 of series 2.  Spleen, pancreas, right adrenal gland, and right kidney appear normal. The left adrenal gland is not visualized and may have been removed.  There is minimal postsurgical scar at the site of the previous nephrectomy.  The bowel appears normal.  There is extensive air in the subcutaneous fat superficial to the muscles of the left anterior and lateral abdominal wall. There is no pus collection. This amount of air in the subcutaneous fat is atypical.  There is a tiny amount of air in the bladder.  IMPRESSION: 1. Marked progression of pulmonary metastases and liver metastases since the prior PET-CT scan dated 08/06/2013. 2. Extensive air in the subcutaneous fat superficial to the muscles of the left side of the anterior lateral abdominal wall without fluid collections to suggest an abscess. This amount of air is atypical. This is of unknown etiology. 3. Minimal postsurgical changes at the  site of the previous nephrectomy.   Electronically Signed   By: Geanie Cooley M.D.   On: 09/27/2013 14:17   US Biopsy  09/29/2013   CLINICAL DATA:  History of left-sided renal cell carcinoma, post left-sided nephrectomy, now with CT findings worrisome for progression / development of hepatic and pulmonary metastases. Please attempted ultrasound-guided liver lesion biopsy.  EXAM: ULTRASOUND BIOPSY CORE LIVER  COMPARISON:  Chest CT - earlier same day; CT abdomen pelvis -09/27/2013; PET-CT- 07/27/2013; abdominal MRI -08/14/2013  FINDINGS: Exhaustive ultrasound scanning was performed by the dictating radiologist however failed to definitively delineate the known dominant hyperenhancing mass involving the anterior segment of the dome of the right lobe of the liver, demonstrated on preprocedural imaging. As such, the patient was brought to CT for attempted CT-guided biopsy biopsy.  IMPRESSION: Known hyperenhancing dominant mass within the  anterior segment of the dome of the right lobe of the liver is not apparent on ultrasound, as such, patient was escorted to CT to undergo attempted CT-guided biopsy.   Electronically Signed   By: Simonne Come M.D.   On: 09/29/2013 14:28   Ct Biopsy  09/29/2013   INDICATION: History of renal cell carcinoma, post nephrectomy, now with recent findings worrisome for progression of pulmonary and hepatic metastatic disease.  EXAM: CT BIOPSY OF HYPERVASCULAR MASS WITHIN THE DOME OF THE RIGHT LOBE OF THE LIVER  MEDICATIONS: Fentanyl 200 mcg IV; Versed 2 mg IV  ANESTHESIA/SEDATION: Sedation time  25 minutes  CONTRAST:  None  COMPARISON:  Chest CT - 09/29/2013; CT abdomen pelvis -09/27/2013; abdominal MRI -08/14/2013; PET-CT - 08/06/2013  PROCEDURE: Informed consent was obtained from the patient following an explanation of the procedure, risks, benefits and alternatives. A time out was performed prior to the initiation of the procedure.  The patient was positioned supine on the CT table and a limited CT was performed for procedural planning demonstrating grossly unchanged appearance of an approximately 1.6 x 1.3 cm hypo attenuating lesion within the anterior aspect of the dome of the right lobe of the liver (image 2, series 2). Ultrasound scanning was again performed however again failed to delineate a definitive lesion at this location. As such, the procedure was attempted with CT fluoroscopic guidance.  The procedure was planned. The operative site was prepped and draped in the usual sterile fashion. Appropriate trajectory was confirmed with a 22 gauge spinal needle after the adjacent tissues were anesthetized with 1% Lidocaine with epinephrine. Under intermittent CT guidance, a 17 gauge coaxial needle was advanced into the peripheral aspect of the mass. Positioning was confirmed and 3 core needle biopsies were obtained with an 18 gauge core needle biopsy device. The co-axial needle was removed and hemostasis was achieved  with manual compression.  A limited postprocedural CT was negative for hemorrhage or additional complication. A dressing was placed. The patient tolerated the procedure well without immediate postprocedural complication.  COMPLICATIONS: None immediate  IMPRESSION: Technically successful CT guided core needle biopsy ofdominant hypo attenuating lesion within the anterior aspect of the dome of the right lobe of the liver.   Electronically Signed   By: Simonne Come M.D.   On: 09/29/2013 15:53   Dg Abd Acute W/chest  10/03/2013   CLINICAL DATA:  Status post left nephrectomy on 09/15/2013, weakness, orthostatic hypotension, abdominal discomfort  EXAM: ACUTE ABDOMEN SERIES (ABDOMEN 2 VIEW & CHEST 1 VIEW)  COMPARISON:  CT chest dated 09/29/2013. CT abdomen pelvis dated 09/29/2013.  FINDINGS: Nodularity in the lateral right mid lung,  corresponding to suspected metastases on CT. Additional scattered left lung nodules are not well visualized radiographically. No pleural effusion or pneumothorax.  The heart is normal in size. Surgical clips in the right perihilar region.  Bilateral breast augmentation.  Nonobstructive bowel gas pattern.  Surgical clips in the left mid abdomen related to prior nephrectomy.  No evidence of free air on the lateral decubitus view.  Mild degenerative changes of the visualized thoracolumbar spine.  IMPRESSION: Pulmonary nodularity in the right mid lung, better visualized on prior CT. Additional known left lung nodules are not well visualized radiographically.  No evidence of small bowel obstruction or free air.   Electronically Signed   By: Charline Bills M.D.   On: 10/03/2013 16:05    Microbiology: Recent Results (from the past 240 hour(s))  URINE CULTURE     Status: None   Collection Time    10/04/13  3:28 AM      Result Value Range Status   Specimen Description URINE, CLEAN CATCH   Final   Special Requests NONE   Final   Culture  Setup Time     Final   Value: 10/04/2013 11:39      Performed at [facility] Count     Final   Value: >=100,000 COLONIES/ML     Performed at [facility]   Culture     Final   Value: ESCHERICHIA COLI     Performed at [facility]   Report Status 10/06/2013 FINAL   Final   Organism ID, Bacteria ESCHERICHIA COLI   Final     Labs: Basic Metabolic Panel:  Recent Labs Lab 10/03/13 1505 10/04/13 0510 10/05/13 0536 10/09/13 0527 10/10/13 0508  NA 129* 134* 137 137 138  K 4.3 4.3 4.0 3.1* 3.5  CL 94* 104 106 99 103  CO2 22 23 23 26 26   GLUCOSE 155* 113* 160* 158* 109*  BUN 22 20 12 15 13   CREATININE 1.20* 1.00 0.94 0.83 0.79  CALCIUM 9.6 8.6 8.7 9.4 9.5  MG  --   --   --  1.9 1.8   Liver Function Tests:  Recent Labs Lab 10/03/13 1505 10/05/13 0536 10/09/13 0527  AST 13 12 11   ALT 8 7 7   ALKPHOS 112 88 78  BILITOT 0.4 0.2* 0.2*  PROT 8.2 6.5 6.6  ALBUMIN 3.4* 2.8* 3.2*   No results found for this basename: LIPASE, AMYLASE,  in the last 168 hours No results found for this basename: AMMONIA,  in the last 168 hours CBC:  Recent Labs Lab 10/03/13 1505 10/04/13 0510 10/05/13 0536 10/09/13 0527  WBC 11.5* 9.8 8.6 9.9  NEUTROABS 7.7  --   --   --   HGB 11.1* 9.7* 9.5* 9.3*  HCT 35.3* 30.3* 29.2* 29.3*  MCV 80.2 80.6 80.0 80.9  PLT 353 312 301 274   Cardiac Enzymes:  Recent Labs Lab 10/03/13 1505 10/04/13 1300  TROPONINI <0.30 <0.30   BNP: BNP (last 3 results) No results found for this basename: PROBNP,  in the last 8760 hours CBG:  Recent Labs Lab 10/08/13 2027 10/09/13 0740 10/09/13 1151 10/09/13 2027 10/10/13 0738  GLUCAP 209* 143* 116* 123* 93       Signed:  HERNANDEZ ACOSTA,ESTELA  [facility] Pager: [phone] 10/10/2013, 10:09 AM

## 2013-10-10 NOTE — Progress Notes (Signed)
Physical Therapy Treatment Patient Details Name: Kaylee [name] MRN: [id number] DOB: 1940/11/11 Today's Date: 10/10/2013 Time: 4098-1191 PT Time Calculation (min): 28 min  PT Assessment / Plan / Recommendation  History of Present Illness  Kaylee [name] is a 72 y.o. female was recently discharged from the hospital on 10/01/13 and returns on 10/03/13 due to complaints of dizziness in the upright position, anorexia and generalized weakness. 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 Her sister [name] has been staying with her most of the time since hospital discharge. When home health PT went to evaluate her, they found her to be orthostatic, PCPs office was contacted and patient was advised to come to the ED for further management. In the ED, orthostatic hypotension was redemonstrated, sodium 129, chloride 94,    PT Comments   Pt stated she felt much better today.  Assisted OOB to BR then amb in hallway with increased time.  Pt progressing slowly and plans to D/C to SNF for ST Rehab.   Follow Up Recommendations  SNF ([facility])     Does the patient have the potential to tolerate intense rehabilitation     Barriers to Discharge        Equipment Recommendations       Recommendations for Other Services    Frequency Min 3X/week   Progress towards PT Goals Progress towards PT goals: Progressing toward goals  Plan      Precautions / Restrictions Precautions Precautions: Fall Restrictions Weight Bearing Restrictions: No    Pertinent Vitals/Pain No c/o pain    Mobility  Bed Mobility Bed Mobility: Supine to Sit Supine to Sit: 5: Supervision Details for Bed Mobility Assistance: increased time Transfers Transfers: Sit to Stand;Stand to Sit Sit to Stand: With upper extremity assist;From bed;From toilet;4: Min assist Stand  to Sit: 4: Min assist;To toilet;4: Min guard;To chair/3-in-1 Details for Transfer Assistance: verbal cue for hand placement upon initial rise Ambulation/Gait Ambulation/Gait Assistance: 4: Min assist Ambulation Distance (Feet): 145 Feet Assistive device: Rolling walker Ambulation/Gait Assistance Details: 25% VC's on safety with turns and increased time.  No c/o dizzyness this session. Gait Pattern: Step-through pattern;Narrow base of support;Decreased stride length General Gait Details: HIGH FALL RISK      PT Goals (current goals can now be found in the care plan section)    Visit Information  Last PT Received On: 10/10/13 Assistance Needed: +1 History of Present Illness:  Kaylee [name] is a 73 y.o. female was recently discharged from the hospital on 10/01/13 and returns on 10/03/13 due to complaints of dizziness in the upright position, anorexia and generalized weakness. 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 Her sister [name] has been staying with her most of the time since hospital discharge. When home health PT went to evaluate her, they found her to be orthostatic, PCPs office was contacted and patient was advised to come to the ED for further management. In the ED, orthostatic hypotension was redemonstrated, sodium 129, chloride 94,     Subjective Data      Cognition       Balance     End of Session PT - End of Session Equipment Utilized During Treatment: Gait belt Patient left: with call bell/phone within reach;in chair   Felecia Shelling  PTA  [facility]  Acute  Rehab Pager      [phone]

## 2013-10-15 ENCOUNTER — Emergency Department (HOSPITAL_COMMUNITY): Payer: Medicare HMO

## 2013-10-15 ENCOUNTER — Encounter (HOSPITAL_COMMUNITY): Payer: Self-pay | Admitting: Emergency Medicine

## 2013-10-15 ENCOUNTER — Emergency Department (HOSPITAL_COMMUNITY)
Admission: EM | Admit: 2013-10-15 | Discharge: 2013-10-15 | Disposition: A | Discharge status: Home / Self Care | Payer: Medicare HMO | Attending: Emergency Medicine | Admitting: Emergency Medicine

## 2013-10-15 DIAGNOSIS — J4489 Other specified chronic obstructive pulmonary disease: Secondary | ICD-10-CM | POA: Insufficient documentation

## 2013-10-15 DIAGNOSIS — E876 Hypokalemia: Secondary | ICD-10-CM

## 2013-10-15 DIAGNOSIS — IMO0002 Reserved for concepts with insufficient information to code with codable children: Secondary | ICD-10-CM | POA: Insufficient documentation

## 2013-10-15 DIAGNOSIS — R42 Dizziness and giddiness: Secondary | ICD-10-CM | POA: Insufficient documentation

## 2013-10-15 DIAGNOSIS — F17200 Nicotine dependence, unspecified, uncomplicated: Secondary | ICD-10-CM | POA: Insufficient documentation

## 2013-10-15 DIAGNOSIS — K219 Gastro-esophageal reflux disease without esophagitis: Secondary | ICD-10-CM | POA: Insufficient documentation

## 2013-10-15 DIAGNOSIS — J449 Chronic obstructive pulmonary disease, unspecified: Secondary | ICD-10-CM | POA: Insufficient documentation

## 2013-10-15 DIAGNOSIS — Z85118 Personal history of other malignant neoplasm of bronchus and lung: Secondary | ICD-10-CM | POA: Insufficient documentation

## 2013-10-15 DIAGNOSIS — Z880 Allergy status to penicillin: Secondary | ICD-10-CM | POA: Insufficient documentation

## 2013-10-15 DIAGNOSIS — Z8669 Personal history of other diseases of the nervous system and sense organs: Secondary | ICD-10-CM | POA: Insufficient documentation

## 2013-10-15 DIAGNOSIS — F3289 Other specified depressive episodes: Secondary | ICD-10-CM | POA: Insufficient documentation

## 2013-10-15 DIAGNOSIS — R5381 Other malaise: Secondary | ICD-10-CM | POA: Insufficient documentation

## 2013-10-15 DIAGNOSIS — F329 Major depressive disorder, single episode, unspecified: Secondary | ICD-10-CM | POA: Insufficient documentation

## 2013-10-15 DIAGNOSIS — Z79899 Other long term (current) drug therapy: Secondary | ICD-10-CM | POA: Insufficient documentation

## 2013-10-15 DIAGNOSIS — R51 Headache: Secondary | ICD-10-CM | POA: Insufficient documentation

## 2013-10-15 LAB — COMPREHENSIVE METABOLIC PANEL
AST: 12 U/L (ref 0–37)
Albumin: 3.5 g/dL (ref 3.5–5.2)
Alkaline Phosphatase: 81 U/L (ref 39–117)
BUN: 19 mg/dL (ref 6–23)
Calcium: 9.1 mg/dL (ref 8.4–10.5)
Potassium: 2.4 mEq/L — CL (ref 3.5–5.1)
Sodium: 137 mEq/L (ref 135–145)
Total Protein: 7 g/dL (ref 6.0–8.3)

## 2013-10-15 LAB — CBC WITH DIFFERENTIAL/PLATELET
Basophils Absolute: 0 10*3/uL (ref 0.0–0.1)
Basophils Relative: 0 % (ref 0–1)
Eosinophils Absolute: 0.3 10*3/uL (ref 0.0–0.7)
Eosinophils Relative: 3 % (ref 0–5)
MCH: 26.6 pg (ref 26.0–34.0)
MCHC: 33.7 g/dL (ref 30.0–36.0)
Monocytes Relative: 8 % (ref 3–12)
Neutro Abs: 7.2 10*3/uL (ref 1.7–7.7)
Neutrophils Relative %: 64 % (ref 43–77)
Platelets: 267 10*3/uL (ref 150–400)
RDW: 16.1 % — ABNORMAL HIGH (ref 11.5–15.5)

## 2013-10-15 LAB — SEDIMENTATION RATE: Sed Rate: 52 mm/hr — ABNORMAL HIGH (ref 0–22)

## 2013-10-15 LAB — PROTIME-INR: Prothrombin Time: 12.1 seconds (ref 11.6–15.2)

## 2013-10-15 MED ORDER — POTASSIUM CHLORIDE 10 MEQ/100ML IV SOLN
10.0000 meq | INTRAVENOUS | Status: AC
  Administered 2013-10-15: 10 meq via INTRAVENOUS
  Filled 2013-10-15: qty 100

## 2013-10-15 MED ORDER — POTASSIUM CHLORIDE ER 10 MEQ PO TBCR: 20.0000 meq | EXTENDED_RELEASE_TABLET | Freq: Two times a day (BID) | ORAL | Status: DC

## 2013-10-15 MED ORDER — SODIUM CHLORIDE 0.9 % IV BOLUS (SEPSIS)
1000.0000 mL | Freq: Once | INTRAVENOUS | Status: AC
  Administered 2013-10-15: 1000 mL via INTRAVENOUS

## 2013-10-15 MED ORDER — MORPHINE SULFATE 4 MG/ML IJ SOLN
4.0000 mg | Freq: Once | INTRAMUSCULAR | Status: AC
  Administered 2013-10-15: 4 mg via INTRAVENOUS
  Filled 2013-10-15: qty 1

## 2013-10-15 MED ORDER — ONDANSETRON HCL 4 MG/2ML IJ SOLN
4.0000 mg | Freq: Once | INTRAMUSCULAR | Status: AC
  Administered 2013-10-15: 4 mg via INTRAVENOUS
  Filled 2013-10-15: qty 2

## 2013-10-15 MED ORDER — TETRACAINE HCL 0.5 % OP SOLN
2.0000 [drp] | Freq: Once | OPHTHALMIC | Status: AC
  Administered 2013-10-15: 2 [drp] via OPHTHALMIC
  Filled 2013-10-15: qty 2

## 2013-10-15 MED ORDER — GADOBENATE DIMEGLUMINE 529 MG/ML IV SOLN
15.0000 mL | Freq: Once | INTRAVENOUS | Status: AC | PRN
  Administered 2013-10-15: 12 mL via INTRAVENOUS

## 2013-10-15 MED ORDER — POTASSIUM CHLORIDE CRYS ER 20 MEQ PO TBCR
40.0000 meq | EXTENDED_RELEASE_TABLET | Freq: Once | ORAL | Status: AC
  Administered 2013-10-15: 40 meq via ORAL
  Filled 2013-10-15: qty 2

## 2013-10-15 NOTE — ED Provider Notes (Signed)
CSN: [id number]     Arrival date & time 10/15/13  1220 History   First Kaylee [name] Initiated Contact with Patient 10/15/13 1235     Chief Complaint  Patient presents with  . Migraine   (Consider location/radiation/quality/duration/timing/severity/associated sxs/prior Treatment) HPI Comments: Patient with history of metastatic renal carcinoma presenting with 10 day history of diffuse headache. He has been waxing and waning in severity getting progressively worse. She denies thunderclap onset. Given one episode of vomiting today. She denies any fevers. She denies any focal weakness, numbness or tingling. Her daughter reports that she had an episode yesterday where she "spaced out" and had difficulty making sense for a few minutes. Patient reports a history of migraine headaches but has not had a headache like this in many years. She denies any visual change. She's been taking Zofran, Toradol Darvocet without relief. She MRI on December 8 showed no metastatic disease of her brain. She is not getting any chemo or radiation.  The history is provided by the patient.    Past Medical History  Diagnosis Date  . Depression   . Back pain   . Lung cancer     s/p resection  . COPD (chronic obstructive pulmonary disease)   . Headache(784.0)     HX MIGRAINES   . GERD (gastroesophageal reflux disease)   . Renal cell cancer Left  . Frequency of urination   . Urgency of urination   . Nocturia   . Hernia     L LOWER ABD  . Difficulty sleeping    Past Surgical History  Procedure Laterality Date  . Lung surgery  1985    RT UPPER LOBE REMOVED FOR CANCER - NOT FRUTHER TX  . Breast surgery  1988    AUGMENTATION  . Back surgery  1985  . Abdominal hysterectomy  1970  . Hand surgery      LEFT  . Robot assisted laparoscopic nephrectomy Left 09/15/2013    Procedure: ROBOTIC ASSISTED LAPAROSCOPIC NEPHRECTOMY;  Surgeon: Sebastian Ache, Kaylee [name];  Location: [facility];  Service: Urology;  Laterality: Left;   Family  History  Problem Relation Age of Onset  . Cancer Mother     Melanoma  . Heart disease Father   . Stroke Father   . Cancer Maternal Grandmother     Colon cancer  . Cancer Brother     Colon cancer  . Cancer Sister     Adrenal gland cancer   History  Substance Use Topics  . Smoking status: Current Every Day Smoker -- 0.50 packs/day for 54 years    Types: Cigarettes  . Smokeless tobacco: Current User  . Alcohol Use: No   OB History   Grav Para Term Preterm Abortions TAB SAB Ect Mult Living                 Review of Systems  Constitutional: Positive for activity change, appetite change and fatigue. Negative for fever.  Respiratory: Negative for cough, chest tightness and shortness of breath.   Cardiovascular: Negative for chest pain.  Gastrointestinal: Positive for nausea and vomiting. Negative for abdominal pain.  Genitourinary: Negative for dysuria, hematuria, vaginal bleeding and vaginal discharge.  Musculoskeletal: Negative for arthralgias, back pain, myalgias, neck pain and neck stiffness.  Skin: Negative for rash.  Neurological: Positive for dizziness, weakness, light-headedness and headaches.  A complete 10 system review of systems was obtained and all systems are negative except as noted in the HPI and PMH.    Allergies  Penicillins and  Codeine  Home Medications   Current Outpatient Rx  Name  Route  Sig  Dispense  Refill  . ALPRAZolam (XANAX) 0.25 MG tablet   Oral   Take 1 tablet (0.25 mg total) by mouth 2 (two) times daily as needed for anxiety or sleep.   30 tablet   0   . fludrocortisone (FLORINEF) 0.1 MG tablet   Oral   Take 3 tablets (0.3 mg total) by mouth 3 (three) times daily.         . midodrine (PROAMATINE) 10 MG tablet   Oral   Take 1 tablet (10 mg total) by mouth every 4 (four) hours while awake.         Marland Kitchen omeprazole (PRILOSEC) 40 MG capsule   Oral   Take 40 mg by mouth daily.         Marland Kitchen PARoxetine (PAXIL) 20 MG tablet   Oral   Take  20 mg by mouth every evening.         . polyethylene glycol (MIRALAX / GLYCOLAX) packet   Oral   Take 17 g by mouth daily.         Marland Kitchen senna-docusate (SENOKOT-S) 8.6-50 MG per tablet   Oral   Take 1 tablet by mouth 2 (two) times daily. While taking pain meds to prevent constipation.   30 tablet   1   . potassium chloride (K-DUR) 10 MEQ tablet   Oral   Take 2 tablets (20 mEq total) by mouth 2 (two) times daily.   20 tablet   0    BP 154/66  Pulse 68  Temp(Src) 97.5 F (36.4 C) (Oral)  Resp 18  SpO2 96% Physical Exam  Constitutional: She is oriented to person, place, and time. She appears well-developed and well-nourished. No distress.  Uncomfortable  HENT:  Head: Normocephalic and atraumatic.  Mouth/Throat: Oropharynx is clear and moist. No oropharyngeal exudate.  No temporal artery tenderness  Eyes: Conjunctivae and EOM are normal. Pupils are equal, round, and reactive to light.  IOP OD 13, IOP OS 12.  Neck: Normal range of motion. Neck supple.  No meningismus  Cardiovascular: Normal rate, regular rhythm and normal heart sounds.   No murmur heard. Pulmonary/Chest: Effort normal and breath sounds normal. No respiratory distress.  Abdominal: Soft. There is no tenderness. There is no rebound and no guarding.  Musculoskeletal: Normal range of motion. She exhibits no edema and no tenderness.  Neurological: She is alert and oriented to person, place, and time. No cranial nerve deficit. She exhibits normal muscle tone. Coordination normal.  CN 2-12 intact, no ataxia on finger to nose, no nystagmus, 5/5 strength throughout, no pronator drift, Romberg negative, normal gait.   Skin: Skin is warm.    ED Course  Procedures (including critical care time) Labs Review Labs Reviewed  CBC WITH DIFFERENTIAL - Abnormal; Notable for the following:    WBC 11.2 (*)    RBC 3.84 (*)    Hemoglobin 10.2 (*)    HCT 30.3 (*)    RDW 16.1 (*)    All other components within normal limits   COMPREHENSIVE METABOLIC PANEL - Abnormal; Notable for the following:    Potassium 2.4 (*)    Glucose, Bld 125 (*)    GFR calc non Af Amer 80 (*)    All other components within normal limits  SEDIMENTATION RATE - Abnormal; Notable for the following:    Sed Rate 52 (*)    All other components within normal limits  PROTIME-INR  MAGNESIUM  URINALYSIS, ROUTINE W REFLEX MICROSCOPIC   Imaging Review Ct Head Wo Contrast  10/15/2013   CLINICAL DATA:  Headache, nausea, history of lung cancer  EXAM: CT HEAD WITHOUT CONTRAST  TECHNIQUE: Contiguous axial images were obtained from the base of the skull through the vertex without intravenous contrast.  COMPARISON:  09/30/2013  FINDINGS: No skull fracture is noted. Paranasal sinuses and mastoid air cells are unremarkable. No intracranial hemorrhage, mass effect or midline shift.  No acute infarction. No mass lesion is noted on this unenhanced scan. Mild cerebral atrophy.  IMPRESSION: No acute intracranial abnormality.  Stable cerebral atrophy.   Electronically Signed   By: Natasha Mead M.D.   On: 10/15/2013 14:17   Mr Laqueta Jean ZO Contrast  10/15/2013   CLINICAL DATA:  Severe headache. History of renal cell carcinoma and lung cancer.  EXAM: MRI HEAD WITHOUT AND WITH CONTRAST  TECHNIQUE: Multiplanar, multiecho pulse sequences of the brain and surrounding structures were obtained without and with intravenous contrast.  CONTRAST:  12mL MULTIHANCE GADOBENATE DIMEGLUMINE 529 MG/ML IV SOLN  COMPARISON:  Head CT 10/15/2013 and brain MRI 09/30/2013  FINDINGS: Patchy areas of T2 hyperintensity within the subcortical and deep cerebral white matter and pons are unchanged and compatible with mild chronic small vessel ischemic disease. There is no evidence of acute infarct, intracranial hemorrhage, mass, midline shift, or extra-axial fluid collection. There is mild generalized cerebral atrophy. There is no abnormal enhancement. Prior bilateral cataract surgery is noted. Small  amount of frothy secretion in the sphenoid sinus has decreased from the prior exam. Small, left larger than right mastoid effusions are unchanged. Major intracranial vascular flow voids are unremarkable.  IMPRESSION: Stable appearance of the brain. No evidence of acute intracranial abnormality or intracranial metastasis.   Electronically Signed   By: Sebastian Ache   On: 10/15/2013 16:48    EKG Interpretation    Date/Time:  Wednesday October 15 2013 17:03:26 EST Ventricular Rate:  64 PR Interval:  146 QRS Duration: 93 QT Interval:  433 QTC Calculation: 447 R Axis:   72 Text Interpretation:  Sinus rhythm No significant change since last tracing Confirmed by KNAPP  Kaylee [name]-J, JON ([id number]) on 10/15/2013 5:07:12 PM            MDM   1. Headache   2. Hypokalemia    10 day history of progressively worsening headache that waxes and wanes in intensity. No fevers. One episode of vomiting this morning. No vision change. History of metastatic renal cell carcinoma. Has had headaches previously but not the severity for several years. Denies thunderclap onset.  Neurologically intact. Headache is resolved after treatment with medications. CT head is negative. Patient MRI on December 8 the did not show any metastatic intracranial disease.  Labs remarkable for hypokalemia of 2.4. Magnesium normal.  History of metastatic cancer presenting with head pain for the past 10 days it is progressively worsening. Associated photophobia nausea and vomiting. No fever.  Doubt temporal arteritis as pain is bilateral, no vision change, no temporal artery tenderness. IOP Normal bilaterally. Doubt SAH as no thunderclap onset. NO meningismus. Patient currently not immunocompromised. Potassium being repleted at time of sign out to Dr. Lynelle Doctor. MRI pending to evaluate for mets.  Kaylee Octave, Kaylee [name] 10/15/13 [phone]

## 2013-10-15 NOTE — ED Notes (Signed)
Patient transported to MRI 

## 2013-10-15 NOTE — ED Notes (Signed)
Patient with head pain since yesterday, progressively worsening.  Patient also having nausea and vomiting.  Patient given Darvocet at [facility] with no improvement.  EMS gave Zofran 4mg  and 30mg  Toradol with no improvement.

## 2013-10-15 NOTE — ED Notes (Signed)
Pt verbalized that she wanted to go home.  MD was made aware that pt onlu received 1 run of the KCl IV; Md states that it's okay to discharge pt after taking the oral potassium.

## 2013-10-15 NOTE — ED Notes (Signed)
Bed: UE45 Expected date:  Expected time:  Means of arrival:  Comments: migraine

## 2013-10-15 NOTE — ED Provider Notes (Signed)
Case was discussed with Dr Jacky Kindle.  Pt had been sent from the nursing home to rule out hemorrhage.  Imaging tests are normal in the ED.  Pt can be managed back at the nursing facility including any iv therapy that she may need.  Pt is having some pain from the potassium infusion.  Will decrease drip rate.  Celene Kras, MD 10/15/13 [phone]

## 2013-10-21 ENCOUNTER — Encounter: Payer: Self-pay | Admitting: Hematology and Oncology

## 2013-10-21 ENCOUNTER — Encounter: Payer: Commercial Managed Care - HMO | Admitting: Hematology and Oncology

## 2013-10-21 ENCOUNTER — Other Ambulatory Visit: Payer: Self-pay | Admitting: Hematology and Oncology

## 2013-10-21 ENCOUNTER — Encounter (HOSPITAL_COMMUNITY): Payer: Self-pay

## 2013-10-21 ENCOUNTER — Inpatient Hospital Stay (HOSPITAL_COMMUNITY)
Admission: AD | Admit: 2013-10-21 | Discharge: 2013-10-23 | DRG: 689 | Disposition: A | Discharge status: Home Health | Payer: Medicare HMO | Source: Ambulatory Visit | Attending: Hematology and Oncology | Admitting: Hematology and Oncology

## 2013-10-21 DIAGNOSIS — K922 Gastrointestinal hemorrhage, unspecified: Secondary | ICD-10-CM

## 2013-10-21 DIAGNOSIS — Z8249 Family history of ischemic heart disease and other diseases of the circulatory system: Secondary | ICD-10-CM

## 2013-10-21 DIAGNOSIS — Z808 Family history of malignant neoplasm of other organs or systems: Secondary | ICD-10-CM

## 2013-10-21 DIAGNOSIS — D638 Anemia in other chronic diseases classified elsewhere: Secondary | ICD-10-CM | POA: Diagnosis present

## 2013-10-21 DIAGNOSIS — Z800 Family history of malignant neoplasm of digestive organs: Secondary | ICD-10-CM

## 2013-10-21 DIAGNOSIS — F411 Generalized anxiety disorder: Secondary | ICD-10-CM

## 2013-10-21 DIAGNOSIS — C787 Secondary malignant neoplasm of liver and intrahepatic bile duct: Secondary | ICD-10-CM | POA: Diagnosis present

## 2013-10-21 DIAGNOSIS — E2749 Other adrenocortical insufficiency: Secondary | ICD-10-CM | POA: Diagnosis present

## 2013-10-21 DIAGNOSIS — Z823 Family history of stroke: Secondary | ICD-10-CM

## 2013-10-21 DIAGNOSIS — Z87440 Personal history of urinary (tract) infections: Secondary | ICD-10-CM

## 2013-10-21 DIAGNOSIS — K5900 Constipation, unspecified: Secondary | ICD-10-CM

## 2013-10-21 DIAGNOSIS — E860 Dehydration: Secondary | ICD-10-CM

## 2013-10-21 DIAGNOSIS — R51 Headache: Secondary | ICD-10-CM

## 2013-10-21 DIAGNOSIS — C7800 Secondary malignant neoplasm of unspecified lung: Secondary | ICD-10-CM | POA: Diagnosis present

## 2013-10-21 DIAGNOSIS — C689 Malignant neoplasm of urinary organ, unspecified: Secondary | ICD-10-CM

## 2013-10-21 DIAGNOSIS — C649 Malignant neoplasm of unspecified kidney, except renal pelvis: Secondary | ICD-10-CM

## 2013-10-21 DIAGNOSIS — A498 Other bacterial infections of unspecified site: Secondary | ICD-10-CM | POA: Diagnosis present

## 2013-10-21 DIAGNOSIS — J4489 Other specified chronic obstructive pulmonary disease: Secondary | ICD-10-CM | POA: Diagnosis present

## 2013-10-21 DIAGNOSIS — E2740 Unspecified adrenocortical insufficiency: Secondary | ICD-10-CM

## 2013-10-21 DIAGNOSIS — R519 Headache, unspecified: Secondary | ICD-10-CM

## 2013-10-21 DIAGNOSIS — IMO0002 Reserved for concepts with insufficient information to code with codable children: Secondary | ICD-10-CM

## 2013-10-21 DIAGNOSIS — E876 Hypokalemia: Secondary | ICD-10-CM

## 2013-10-21 DIAGNOSIS — E43 Unspecified severe protein-calorie malnutrition: Secondary | ICD-10-CM | POA: Diagnosis present

## 2013-10-21 DIAGNOSIS — E46 Unspecified protein-calorie malnutrition: Secondary | ICD-10-CM

## 2013-10-21 DIAGNOSIS — R6889 Other general symptoms and signs: Secondary | ICD-10-CM

## 2013-10-21 DIAGNOSIS — E871 Hypo-osmolality and hyponatremia: Secondary | ICD-10-CM | POA: Diagnosis present

## 2013-10-21 DIAGNOSIS — J449 Chronic obstructive pulmonary disease, unspecified: Secondary | ICD-10-CM | POA: Diagnosis present

## 2013-10-21 DIAGNOSIS — K219 Gastro-esophageal reflux disease without esophagitis: Secondary | ICD-10-CM | POA: Diagnosis present

## 2013-10-21 DIAGNOSIS — N390 Urinary tract infection, site not specified: Principal | ICD-10-CM

## 2013-10-21 HISTORY — DX: Headache, unspecified: R51.9

## 2013-10-21 MED ORDER — ENSURE COMPLETE PO LIQD
237.0000 mL | Freq: Two times a day (BID) | ORAL | Status: DC
  Administered 2013-10-21 – 2013-10-23 (×4): 237 mL via ORAL

## 2013-10-21 MED ORDER — PANTOPRAZOLE SODIUM 40 MG PO TBEC
40.0000 mg | DELAYED_RELEASE_TABLET | Freq: Every day | ORAL | Status: DC
  Administered 2013-10-21 – 2013-10-23 (×3): 40 mg via ORAL
  Filled 2013-10-21 (×3): qty 1

## 2013-10-21 MED ORDER — DEXTROSE 5 % IV SOLN
1.0000 g | INTRAVENOUS | Status: DC
  Administered 2013-10-21 – 2013-10-23 (×3): 1 g via INTRAVENOUS
  Filled 2013-10-21 (×3): qty 10

## 2013-10-21 MED ORDER — ACETAMINOPHEN 325 MG PO TABS: 650.0000 mg | ORAL_TABLET | ORAL | Status: DC | PRN

## 2013-10-21 MED ORDER — SENNOSIDES-DOCUSATE SODIUM 8.6-50 MG PO TABS
1.0000 | ORAL_TABLET | Freq: Two times a day (BID) | ORAL | Status: DC
  Administered 2013-10-21 – 2013-10-22 (×4): 1 via ORAL
  Filled 2013-10-21 (×6): qty 1

## 2013-10-21 MED ORDER — POTASSIUM CHLORIDE CRYS ER 20 MEQ PO TBCR
20.0000 meq | EXTENDED_RELEASE_TABLET | Freq: Two times a day (BID) | ORAL | Status: AC
  Administered 2013-10-21 – 2013-10-23 (×4): 20 meq via ORAL
  Filled 2013-10-21 (×4): qty 1

## 2013-10-21 MED ORDER — ALUM & MAG HYDROXIDE-SIMETH 200-200-20 MG/5ML PO SUSP: 60.0000 mL | ORAL | Status: DC | PRN

## 2013-10-21 MED ORDER — OXYCODONE-ACETAMINOPHEN 5-325 MG PO TABS
1.0000 | ORAL_TABLET | Freq: Once | ORAL | Status: AC
  Administered 2013-10-21: 1 via ORAL

## 2013-10-21 MED ORDER — ENOXAPARIN SODIUM 40 MG/0.4ML ~~LOC~~ SOLN
40.0000 mg | Freq: Every day | SUBCUTANEOUS | Status: DC
  Administered 2013-10-21 – 2013-10-23 (×2): 40 mg via SUBCUTANEOUS
  Filled 2013-10-21 (×3): qty 0.4

## 2013-10-21 MED ORDER — SODIUM CHLORIDE 0.9 % IV SOLN
INTRAVENOUS | Status: DC
  Administered 2013-10-21: 1000 mL via INTRAVENOUS
  Administered 2013-10-21: 10:00:00 via INTRAVENOUS

## 2013-10-21 MED ORDER — ALPRAZOLAM 0.25 MG PO TABS: 0.2500 mg | ORAL_TABLET | Freq: Two times a day (BID) | ORAL | Status: DC | PRN

## 2013-10-21 MED ORDER — DEXTROSE 5 % IV SOLN
1.0000 g | Freq: Two times a day (BID) | INTRAVENOUS | Status: DC
  Filled 2013-10-21 (×2): qty 10

## 2013-10-21 MED ORDER — POLYETHYLENE GLYCOL 3350 17 G PO PACK
17.0000 g | PACK | Freq: Every day | ORAL | Status: DC
  Administered 2013-10-21 – 2013-10-22 (×2): 17 g via ORAL
  Filled 2013-10-21 (×3): qty 1

## 2013-10-21 MED ORDER — PAROXETINE HCL 20 MG PO TABS
20.0000 mg | ORAL_TABLET | Freq: Every evening | ORAL | Status: DC
  Administered 2013-10-21 – 2013-10-22 (×2): 20 mg via ORAL
  Filled 2013-10-21 (×3): qty 1

## 2013-10-21 MED ORDER — OXYCODONE-ACETAMINOPHEN 5-325 MG PO TABS
ORAL_TABLET | ORAL | Status: AC
  Filled 2013-10-21: qty 1

## 2013-10-21 MED ORDER — OXYCODONE HCL 5 MG PO TABS
10.0000 mg | ORAL_TABLET | ORAL | Status: DC | PRN
  Administered 2013-10-21 – 2013-10-23 (×10): 10 mg via ORAL
  Filled 2013-10-21 (×10): qty 2

## 2013-10-21 NOTE — Progress Notes (Signed)
INITIAL NUTRITION ASSESSMENT  DOCUMENTATION CODES Per approved criteria  -Not Applicable   INTERVENTION: -Recommend Ensure Complete po BID, each supplement provides 350 kcal and 13 grams of protein -Consider addition of appetite stimulant d/t prolonged period of minimal appetite/ lack of desire to eat  NUTRITION DIAGNOSIS: Inadequate oral intake related to decreased appetite as evidenced by PO intake <75% est nutritional needs.   Goal: Pt to meet >/= 90% of their estimated nutrition needs   Monitor:  Total protein/energy intake, GI profile, Labs, Weight trends  Reason for Assessment: Malnutrition Screening Tool - 60  72 y.o. female  Admitting Dx: <principal problem not specified>  ASSESSMENT: Kaylee [name] 72 y.o. female is here because of uncontrolled headache and multi-resistant urinary tract infection  -Pt admitted w/dehydration and no BMs in one week per MD note. Encouraged to drink more fluids to prevent dehydration and assist with constipation -Pt denied any significant changes in weight. Husband passed in 2009 and lost approx 25 lbs gradually over the past 5 years. Remains stable around 123 lbs.  -Has had decreased appetite and feelings of fatigue and weakness for past year. Reported foods have minimal taste and lacks desire to eat. Provided examples of ways to improve foods taste. Consider appetite stimulant -Drinks Strawberry Ensure BID -Appetite may also improve once pt is able to have regular BMs  Height: Ht Readings from Last 1 Encounters:  10/21/13 5\' 7"  (1.702 m)    Weight: Wt Readings from Last 1 Encounters:  10/21/13 123 lb 9.6 oz (56.065 kg)    Ideal Body Weight: 135 lbs  % Ideal Body Weight: 91%  Wt Readings from Last 10 Encounters:  10/21/13 123 lb 9.6 oz (56.065 kg)  10/10/13 123 lb (55.792 kg)  09/27/13 125 lb (56.7 kg)  09/15/13 137 lb 5.6 oz (62.3 kg)  09/15/13 137 lb 5.6 oz (62.3 kg)  09/09/13 128 lb 4 oz (58.174 kg)  09/03/13 129  lb 6.4 oz (58.695 kg)  04/20/10 130 lb (58.968 kg)  12/21/09 136 lb (61.689 kg)  12/22/08 134 lb 8 oz (61.009 kg)    Usual Body Weight: 123 lbs  % Usual Body Weight: 100%  BMI:  There is no weight on file to calculate BMI.  Estimated Nutritional Needs: Kcal: 1700-1900 Protein: 70-80 gram Fluid: 1900 ml/daily  Skin: WDL  Diet Order: General  EDUCATION NEEDS: -No education needs identified at this time   Intake/Output Summary (Last 24 hours) at 10/21/13 1504 Last data filed at 10/21/13 1138  Gross per 24 hour  Intake      0 ml  Output    300 ml  Net   -300 ml    Last BM: None during admit  Labs:   Recent Labs Lab 10/15/13 1316  NA 137  K 2.4*  CL 98  CO2 25  BUN 19  CREATININE 0.79  CALCIUM 9.1  MG 1.8  GLUCOSE 125*    CBG (last 3)  No results found for this basename: GLUCAP,  in the last 72 hours  Scheduled Meds: . cefTRIAXone (ROCEPHIN)  IV  1 g Intravenous Q24H  . enoxaparin (LOVENOX) injection  40 mg Subcutaneous Daily  . pantoprazole  40 mg Oral Daily  . PARoxetine  20 mg Oral QPM  . polyethylene glycol  17 g Oral Daily  . senna-docusate  1 tablet Oral BID    Continuous Infusions: . sodium chloride 75 mL/hr at 10/21/13 1015    Past Medical History  Diagnosis Date  .  Depression   . Back pain   . Lung cancer     s/p resection  . COPD (chronic obstructive pulmonary disease)   . Headache(784.0)     HX MIGRAINES   . GERD (gastroesophageal reflux disease)   . Renal cell cancer Left  . Frequency of urination   . Urgency of urination   . Nocturia   . Hernia     L LOWER ABD  . Difficulty sleeping   . Headache 10/21/2013    Past Surgical History  Procedure Laterality Date  . Lung surgery  1985    RT UPPER LOBE REMOVED FOR CANCER - NOT FRUTHER TX  . Breast surgery  1988    AUGMENTATION  . Back surgery  1985  . Abdominal hysterectomy  1970  . Hand surgery      LEFT  . Robot assisted laparoscopic nephrectomy Left 09/15/2013     Procedure: ROBOTIC ASSISTED LAPAROSCOPIC NEPHRECTOMY;  Surgeon: Sebastian Ache, MD;  Location: [facility];  Service: Urology;  Laterality: Left;    Lloyd Huger MS RD LDN Clinical Dietitian Pager:[phone]

## 2013-10-21 NOTE — H&P (Signed)
CHIEF COMPLAINTS/PURPOSE OF ADMISSION:  Uncontrolled headache, multi-resistant recurrent urinary tract infection  HISTORY OF PRESENTING ILLNESS:  Kaylee [name] 72 y.o. female is here because of uncontrolled headache and multi-resistant urinary tract infection  I saw the patient first about a month ago for newly diagnosed metastatic renal cell carcinoma. Summary of her oncology history is as follows:  Oncology History    Renal cell carcinoma  Primary site: Kidney (Left)  Staging method: AJCC 7th Edition  Clinical: Stage IV (T3a, N0, M1) signed by Artis Delay, MD on 10/21/2013 8:46 AM  Pathologic: Stage IV (T3a, N0, M1) signed by Artis Delay, MD on 10/21/2013 8:47 AM  Summary: Stage IV (T3a, N0, M1)     Renal cell carcinoma    10/30/2010  Imaging  Negative CT abdomen    09/15/2013  Surgery  Patient underwent left nephrectomy which confirmed RCC, Fuhrman grade III/IV    09/27/2013  Imaging  CT abdomen showed liver metastasis    09/29/2013  Procedure  CT guided liver biopsy confrimed metastatic disease    09/29/2013  Imaging  CT chest showed enlarging pulmonary metastases    09/30/2013 - 10/10/2013  Hospital Admission  Admitted for orthostatic hypotension and headaches and severe malnutrition    The patient was discharged home after a prolonged hospital course for severe orthostatic hypotension and uncontrolled headaches. She had multiple imaging study done to her head which rule out intracranial metastasis. Her blood pressure has fluctuated from very low blood pressure in the hospital to slightly high in my office today.  The patient refused to take Florinef as prescribed because she felt that is exacerbated her headache.  Her headache is all over her head and is associated with nausea and mild blurriness of vision. There were no reported seizures or focal neurological deficit.  The patient is not able to drink enough fluid because of associated headache and nausea. She has lost some weight.   On 10/16/2013, she had blood work which show a leukocytosis, anemia, and hypokalemia. Repeat urinalysis in the nursing home and was noted to have significant bacteria or preliminary culture reported Escherichia coli infection.  Based on her most recent susceptibility done in the hospital dated 10/04/2013, the Escherichia coli was multiresistant to many different antibiotics except for Zosyn and ceftriaxone.  She denies any dysuria, frequency or urgency.  She denies any recent fever, chills, or night sweats. No reported cough  The patient is also severely constipated, has not moved her bowel for over one week. She is not able to participate in physical therapy is intended when she was discharged to the skilled nursing facility  MEDICAL HISTORY:  Past Medical History   Diagnosis  Date   .  Depression    .  Back pain    .  Lung cancer      s/p resection   .  COPD (chronic obstructive pulmonary disease)    .  Headache(784.0)      HX MIGRAINES   .  GERD (gastroesophageal reflux disease)    .  Renal cell cancer  Left   .  Frequency of urination    .  Urgency of urination    .  Nocturia    .  Hernia      L LOWER ABD   .  Difficulty sleeping    .  Headache  10/21/2013    SURGICAL HISTORY:  Past Surgical History   Procedure  Laterality  Date   .  Lung surgery  1985     RT UPPER LOBE REMOVED FOR CANCER - NOT FRUTHER TX   .  Breast surgery   1988     AUGMENTATION   .  Back surgery   1985   .  Abdominal hysterectomy   1970   .  Hand surgery       LEFT   .  Robot assisted laparoscopic nephrectomy  Left  09/15/2013     Procedure: ROBOTIC ASSISTED LAPAROSCOPIC NEPHRECTOMY; Surgeon: Sebastian Ache, MD; Location: [facility]; Service: Urology; Laterality: Left;    SOCIAL HISTORY:  History    Social History   .  Marital Status:  Widowed     Spouse Name:  N/A     Number of Children:  1   .  Years of Education:  12    Occupational History   .  Prior Doctor, general practice work     Social History  Main Topics   .  Smoking status:  Current Every Day Smoker -- 0.50 packs/day for 54 years     Types:  Cigarettes   .  Smokeless tobacco:  Current User   .  Alcohol Use:  No   .  Drug Use:  No   .  Sexual Activity:  No    Other Topics  Concern   .  Not on file    Social History Narrative    Widowed since July 2009. Lives alone. Ambulates without assistance.    FAMILY HISTORY:  Family History   Problem  Relation  Age of Onset   .  Cancer  Mother      Melanoma   .  Heart disease  Father    .  Stroke  Father    .  Cancer  Maternal Grandmother      Colon cancer   .  Cancer  Brother      Colon cancer   .  Cancer  Sister      Adrenal gland cancer    ALLERGIES: is allergic to penicillins and codeine.  MEDICATIONS:  Current Outpatient Prescriptions   Medication  Sig  Dispense  Refill   .  ALPRAZolam (XANAX) 0.25 MG tablet  Take 1 tablet (0.25 mg total) by mouth 2 (two) times daily as needed for anxiety or sleep.  30 tablet  0   .  fludrocortisone (FLORINEF) 0.1 MG tablet  Take 3 tablets (0.3 mg total) by mouth 3 (three) times daily.     .  midodrine (PROAMATINE) 10 MG tablet  Take 1 tablet (10 mg total) by mouth every 4 (four) hours while awake.     Marland Kitchen  omeprazole (PRILOSEC) 40 MG capsule  Take 40 mg by mouth daily.     Marland Kitchen  PARoxetine (PAXIL) 20 MG tablet  Take 20 mg by mouth every evening.     .  polyethylene glycol (MIRALAX / GLYCOLAX) packet  Take 17 g by mouth daily.     Marland Kitchen  senna-docusate (SENOKOT-S) 8.6-50 MG per tablet  Take 1 tablet by mouth 2 (two) times daily. While taking pain meds to prevent constipation.  30 tablet  1    No current facility-administered medications for this visit.    Facility-Administered Medications Ordered in Other Visits   Medication  Dose  Route  Frequency  Provider  Last Rate  Last Dose   .  oxyCODONE-acetaminophen (PERCOCET/ROXICET) 5-325 MG per tablet 1 tablet  1 tablet  Oral  Once  Artis Delay, MD  REVIEW OF SYSTEMS:  Ears, nose, mouth,  throat, and face: Denies mucositis or sore throat. She complained of dry mouth  Respiratory: Denies cough, dyspnea or wheezes  Cardiovascular: Denies palpitation, chest discomfort or lower extremity swelling  Skin: Denies abnormal skin rashes  Lymphatics: Denies new lymphadenopathy or easy bruising  Neurological:Denies numbness, tingling or new weaknesses  Behavioral/Psych: Mood is stable, no new changes  All other systems were reviewed with the patient and are negative.  PHYSICAL EXAMINATION:  ECOG PERFORMANCE STATUS: 2 - Symptomatic, <50% confined to bed  Filed Vitals:    10/21/13 0845   BP:  155/85   Pulse:  82   Temp:  97.4 F (36.3 C)   Resp:  20    Filed Weights    10/21/13 0845   Weight:  123 lb 9.6 oz (56.065 kg)    GENERAL:alert, in severe distress from headaches. She looks thin and cachectic  SKIN: skin color, texture, turgor are normal, no rashes or significant lesions  EYES: normal, conjunctiva are pink and non-injected, sclera clear  OROPHARYNX:no exudate, no erythema and lips, buccal mucosa, and tongue normal . She has significant dry mouth  NECK: supple, thyroid normal size, non-tender, without nodularity  LYMPH: no palpable lymphadenopathy in the cervical, axillary or inguinal  LUNGS: clear to auscultation and percussion with normal breathing effort  HEART: regular rate & rhythm and no murmurs and no lower extremity edema  ABDOMEN:abdomen soft, non-tender and normal bowel sounds  Musculoskeletal:no cyanosis of digits and no clubbing  PSYCH: alert & oriented x 3 with fluent speech  NEURO: no focal motor/sensory deficits  LABORATORY DATA:  I have reviewed the data as listed  Lab Results   Component  Value  Date    WBC  11.2*  10/15/2013    HGB  10.2*  10/15/2013    HCT  30.3*  10/15/2013    MCV  78.9  10/15/2013    PLT  267  10/15/2013    Lab Results   Component  Value  Date    NA  137  10/15/2013    K  2.4*  10/15/2013    CL  98  10/15/2013    CO2  25   10/15/2013    RADIOGRAPHIC STUDIES: I reviewed the imaging studies as follows and agreed there were no evidence of intracranial metastasis  I have personally reviewed the radiological images as listed and agreed with the findings in the report.  Ct Head Wo Contrast  10/15/2013 CLINICAL DATA: Headache, nausea, history of lung cancer EXAM: CT HEAD WITHOUT CONTRAST TECHNIQUE: Contiguous axial images were obtained from the base of the skull through the vertex without intravenous contrast. COMPARISON: 09/30/2013 FINDINGS: No skull fracture is noted. Paranasal sinuses and mastoid air cells are unremarkable. No intracranial hemorrhage, mass effect or midline shift. No acute infarction. No mass lesion is noted on this unenhanced scan. Mild cerebral atrophy. IMPRESSION: No acute intracranial abnormality. Stable cerebral atrophy. Electronically Signed By: Natasha Mead M.D. On: 10/15/2013 14:17  Mr Laqueta Jean UU Contrast  10/15/2013 CLINICAL DATA: Severe headache. History of renal cell carcinoma and lung cancer. EXAM: MRI HEAD WITHOUT AND WITH CONTRAST TECHNIQUE: Multiplanar, multiecho pulse sequences of the brain and surrounding structures were obtained without and with intravenous contrast. CONTRAST: 12mL MULTIHANCE GADOBENATE DIMEGLUMINE 529 MG/ML IV SOLN COMPARISON: Head CT 10/15/2013 and brain MRI 09/30/2013 FINDINGS: Patchy areas of T2 hyperintensity within the subcortical and deep cerebral white matter and pons are unchanged and compatible with mild chronic  small vessel ischemic disease. There is no evidence of acute infarct, intracranial hemorrhage, mass, midline shift, or extra-axial fluid collection. There is mild generalized cerebral atrophy. There is no abnormal enhancement. Prior bilateral cataract surgery is noted. Small amount of frothy secretion in the sphenoid sinus has decreased from the prior exam. Small, left larger than right mastoid effusions are unchanged. Major intracranial vascular flow voids are  unremarkable. IMPRESSION: Stable appearance of the brain. No evidence of acute intracranial abnormality or intracranial metastasis. Electronically Signed By: Sebastian Ache On: 10/15/2013 16:48  ASSESSMENT:  #1 uncontrolled severe headaches  #2 dehydration  #3 multi resistant urinary tract infection  #4 uncontrolled blood pressure  #5 metastatic renal cell carcinoma to liver and lungs  PLAN:  #1 uncontrolled severe headaches  I think the cause of her headaches is due to widely fluctuating blood pressure, dehydration and uncontrolled infection.  I will give her pain medicine as needed, IV fluid resuscitation and close monitoring of her blood pressure.  #2 dehydration  I will give her IV fluid resuscitation  #3 multi resistant urinary tract infection  I will repeat urine culture. I will start her on ceftriaxone based on the most recent susceptibility. Due to recurrent urinary tract infection, she would need minimum 10 day course of intravenous antibiotics  #4 uncontrolled blood pressure  She had uncontrolled blood pressure ranging from very low too high. The patient cannot tolerate such white fluctuation of blood pressure. She refused to take Florinef and has not taken this for at least 5 days. Her blood pressure in my office is actually high. I will hold off resuming her medications unless truly necessary  #5 metastatic renal cell carcinoma to liver and lungs  We'll continue to hold systemic chemotherapy due to multiple unresolved medical comorbidities  #6 hypokalemia  We will replace  #7 DVT prophylaxis  We'll start her on Lovenox  #8 microcytic anemia  This is likely a combination of anemia chronic disease and possibly iron deficiency. Which iron panel tomorrow  #9 Calorie malnutrition Will consult dietitian while hospitalized  Estimated length of stay minimum 10 days due to multiresistant urinary tract infection. The risk of not admitting her would be death from sepsis.  All questions  were answered.  Artha Stavros, MD  10/21/2013 9:05 AM

## 2013-10-21 NOTE — Progress Notes (Signed)
This encounter was created in error - please disregard.

## 2013-10-21 NOTE — Care Management Note (Signed)
   CARE MANAGEMENT NOTE 10/21/2013  Patient:  Kaylee [name]   Account Number:  [id number]  Date Initiated:  10/21/2013  Documentation initiated by:  Johannah Rozas  Subjective/Objective Assessment:   72 yo female admitted with Uncontrolled headache, multi-resistant recurrent urinary tract infection. PCP: Kaylee Pounds, MD     Action/Plan:   Home when stable   Anticipated DC Date:     Anticipated DC Plan:  HOME W HOME HEALTH SERVICES  In-house referral  Clinical Social Worker      DC Planning Services  CM consult      Choice offered to / List presented to:  NA   DME arranged  NA      DME agency  NA     HH arranged  NA      HH agency  NA   Status of service:  Completed, signed off Medicare Important Message given?   (If response is "NO", the following Medicare IM given date fields will be blank) Date Medicare IM given:   Date Additional Medicare IM given:    Discharge Disposition:    Per UR Regulation:  Reviewed for med. necessity/level of care/duration of stay  If discussed at Long Length of Stay Meetings, dates discussed:    Comments:  10/21/13 1101 Kaylee Tassin,MSN,RN [phone]  chart reviewed for utilization of services.Pt discharged from [facility] on 12/19 to SNF. Pt active with [facility] upon dc from SNF. Awaiting Pt eval to further assess dc needs.

## 2013-10-21 NOTE — Evaluation (Signed)
Physical Therapy Evaluation Patient Details Name: Kaylee [name] MRN: [id number] DOB: [birth year] Today's Date: 10/21/2013 Time: 4098-1191 PT Time Calculation (min): 23 min  PT Assessment / Plan / Recommendation History of Present Illness   72 y.o. female was recently discharged from the hospital on 10/01/13 and returns on 10/03/13 due to complaints of dizziness in the upright position, anorexia and generalized weakness. 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 Her sister [name] has been staying with her most of the time since hospital discharge. When home health PT went to evaluate her, they found her to be orthostatic, PCPs office was contacted and patient was advised to come to the ED for further management. In the ED, orthostatic hypotension was redemonstrated, sodium 129, chloride 94,   Clinical Impression  On eval, pt required Min assist for mobility-able to ambulate ~100 feet with walker. Continues to be at high risk for falls. Discussed d/c plan-pt declines return to SNF-will d/c home with family providing 24/7 care. Recommend HHPT and 24/7 supervision/assist. Pt declined [facility] as well.     PT Assessment  Patient needs continued PT services    Follow Up Recommendations  Home health PT;Supervision/Assistance - 24 hour (pt declines return to SNF-states family will provide 24/7 care)    Does the patient have the potential to tolerate intense rehabilitation      Barriers to Discharge        Equipment Recommendations       Recommendations for Other Services OT consult   Frequency Min 3X/week    Precautions / Restrictions Precautions Precautions: Fall Precaution Comments: orthostatic hypotension. R horizontal canal BPPV (from last hospital admission assessment) Restrictions Weight Bearing Restrictions: No   Pertinent  Vitals/Pain HA 5/10      Mobility  Bed Mobility Bed Mobility: Supine to Sit;Sit to Supine Supine to Sit: 6: Modified independent (Device/Increase time) Sit to Supine: 6: Modified independent (Device/Increase time) Transfers Transfers: Sit to Stand;Stand to [employer] Transfers Sit to Stand: 4: Min assist;From bed Stand to Sit: 4: Min assist;To bed Stand Pivot Transfers: 4: Min assist Details for Transfer Assistance: Assist to stabilize.  Ambulation/Gait Ambulation/Gait Assistance: 4: Min assist Ambulation Distance (Feet): 100 Feet Assistive device: Rolling walker Ambulation/Gait Assistance Details: Assist to stabilize throughout ambulation. Bil LE instability noted.  Gait Pattern: Step-through pattern;Decreased stride length;Narrow base of support General Gait Details: HIGH FALL RISK    Exercises     PT Diagnosis: Difficulty walking;Generalized weakness  PT Problem List: Decreased activity tolerance;Decreased balance;Decreased mobility;Decreased strength PT Treatment Interventions: DME instruction;Gait training;Functional mobility training;Therapeutic activities;Therapeutic exercise;Balance training;Patient/family education     PT Goals(Current goals can be found in the care plan section) Acute Rehab PT Goals Patient Stated Goal: home PT Goal Formulation: With patient Time For Goal Achievement: 11/04/13 Potential to Achieve Goals: Good  Visit Information  Last PT Received On: 10/21/13 Assistance Needed: +1 History of Present Illness:  Kaylee [name] is a 72 y.o. female was recently discharged from the hospital on 10/01/13 and returns on 10/03/13 due to complaints of dizziness in the upright position, anorexia and generalized weakness. 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 Her sister [name] has been staying with  her most of the time since hospital discharge. When home health PT went to evaluate her, they  found her to be orthostatic, PCPs office was contacted and patient was advised to come to the ED for further management. In the ED, orthostatic hypotension was redemonstrated, sodium 129, chloride 94,        Prior Functioning  Home Living Family/patient expects to be discharged to:: Private residence Living Arrangements: Alone Available Help at Discharge: Family (sisters, other family) Type of Home: House Home Access: Stairs to enter [employer] of Steps: 1 Home Layout: One level Home Equipment: Environmental consultant - 2 wheels;Shower seat;Wheelchair - manual Prior Function Level of Independence: Needs assistance Gait / Transfers Assistance Needed: since DC on12/10, pt has requiored assistance to get up to bathroom. Verylimited activity. Communication Communication: No difficulties    Cognition  Cognition Arousal/Alertness: Awake/alert Behavior During Therapy: WFL for tasks assessed/performed Overall Cognitive Status: Within Functional Limits for tasks assessed    Extremity/Trunk Assessment Upper Extremity Assessment Upper Extremity Assessment: Generalized weakness Lower Extremity Assessment Lower Extremity Assessment: Generalized weakness Cervical / Trunk Assessment Cervical / Trunk Assessment: Normal   Balance Balance Balance Assessed: Yes Static Standing Balance Static Standing - Balance Support: Bilateral upper extremity supported;No upper extremity supported Static Standing - Level of Assistance: 4: Min assist Dynamic Standing Balance Dynamic Standing - Balance Support: Bilateral upper extremity supported Dynamic Standing - Level of Assistance: 4: Min assist  End of Session PT - End of Session Equipment Utilized During Treatment: Gait belt Activity Tolerance: Patient tolerated treatment well Patient left: in bed;with call bell/phone within reach;with bed alarm set  GP      Rebeca Alert, MPT Pager: [phone]

## 2013-10-21 NOTE — H&P (Signed)
[facility] CONSULT NOTE  Patient Care Team: Gwen Pounds, MD as PCP - General (Internal Medicine) Artis Delay, MD as Consulting Physician (Hematology and Oncology)  CHIEF COMPLAINTS/PURPOSE OF ADMISSION:  Uncontrolled headache, multi-resistant recurrent urinary tract infection  HISTORY OF PRESENTING ILLNESS:  Kaylee [name] 72 y.o. female is here because of uncontrolled headache and multi-resistant urinary tract infection I saw the patient first about a month ago for newly diagnosed metastatic renal cell carcinoma. Summary of her oncology history is as follows: Oncology History   Renal cell carcinoma   Primary site: Kidney (Left)   Staging method: AJCC 7th Edition   Clinical: Stage IV (T3a, N0, M1) signed by Artis Delay, MD on 10/21/2013  8:46 AM   Pathologic: Stage IV (T3a, N0, M1) signed by Artis Delay, MD on 10/21/2013  8:47 AM   Summary: Stage IV (T3a, N0, M1)       Renal cell carcinoma   10/30/2010 Imaging Negative CT abdomen   09/15/2013 Surgery Patient underwent left nephrectomy which confirmed RCC, Fuhrman grade III/IV   09/27/2013 Imaging CT abdomen showed liver metastasis   09/29/2013 Procedure CT guided liver biopsy confrimed metastatic disease   09/29/2013 Imaging CT chest showed enlarging pulmonary metastases   09/30/2013 - 10/10/2013 Hospital Admission Admitted for orthostatic hypotension and headaches and severe malnutrition   The patient was discharged home after a prolonged hospital course for severe orthostatic hypotension and uncontrolled headaches. She had multiple imaging study done to her head which rule out intracranial metastasis. Her blood pressure has fluctuated from very low blood pressure in the hospital to slightly high in my office today. The patient refused to take Florinef as prescribed because she felt that is exacerbated her headache. Her headache is all over her head and is associated with nausea and mild blurriness of vision. There  were no reported seizures or focal neurological deficit. The patient is not able to drink enough fluid because of associated headache and nausea. She has lost some weight. On 10/16/2013, she had blood work which show a leukocytosis, anemia, and hypokalemia. Repeat urinalysis in the nursing home and was noted to have significant bacteria or preliminary culture reported Escherichia coli infection.  Based on her most recent susceptibility done in the hospital dated 10/04/2013, the Escherichia coli was multiresistant to many different antibiotics except for Zosyn and ceftriaxone. She denies any dysuria, frequency or urgency. She denies any recent fever, chills, or night sweats. No reported cough The patient is also severely constipated, has not moved her bowel for over one week. She is not able to participate in physical therapy is intended when she was discharged to the skilled nursing facility  MEDICAL HISTORY:  Past Medical History  Diagnosis Date  . Depression   . Back pain   . Lung cancer     s/p resection  . COPD (chronic obstructive pulmonary disease)   . Headache(784.0)     HX MIGRAINES   . GERD (gastroesophageal reflux disease)   . Renal cell cancer Left  . Frequency of urination   . Urgency of urination   . Nocturia   . Hernia     L LOWER ABD  . Difficulty sleeping   . Headache 10/21/2013    SURGICAL HISTORY: Past Surgical History  Procedure Laterality Date  . Lung surgery  1985    RT UPPER LOBE REMOVED FOR CANCER - NOT FRUTHER TX  . Breast surgery  1988    AUGMENTATION  . Back surgery  1985  . Abdominal hysterectomy  1970  . Hand surgery      LEFT  . Robot assisted laparoscopic nephrectomy Left 09/15/2013    Procedure: ROBOTIC ASSISTED LAPAROSCOPIC NEPHRECTOMY;  Surgeon: Sebastian Ache, MD;  Location: [facility];  Service: Urology;  Laterality: Left;    SOCIAL HISTORY: History   Social History  . Marital Status: Widowed    Spouse Name: N/A    Number of Children: 1   . Years of Education: 12   Occupational History  . Prior Doctor, general practice work    Social History Main Topics  . Smoking status: Current Every Day Smoker -- 0.50 packs/day for 54 years    Types: Cigarettes  . Smokeless tobacco: Current User  . Alcohol Use: No  . Drug Use: No  . Sexual Activity: No   Other Topics Concern  . Not on file   Social History Narrative   Widowed since July 2009.  Lives alone.  Ambulates without assistance.     FAMILY HISTORY: Family History  Problem Relation Age of Onset  . Cancer Mother     Melanoma  . Heart disease Father   . Stroke Father   . Cancer Maternal Grandmother     Colon cancer  . Cancer Brother     Colon cancer  . Cancer Sister     Adrenal gland cancer    ALLERGIES:  is allergic to penicillins and codeine.  MEDICATIONS:  Current Outpatient Prescriptions  Medication Sig Dispense Refill  . ALPRAZolam (XANAX) 0.25 MG tablet Take 1 tablet (0.25 mg total) by mouth 2 (two) times daily as needed for anxiety or sleep.  30 tablet  0  . fludrocortisone (FLORINEF) 0.1 MG tablet Take 3 tablets (0.3 mg total) by mouth 3 (three) times daily.      . midodrine (PROAMATINE) 10 MG tablet Take 1 tablet (10 mg total) by mouth every 4 (four) hours while awake.      Marland Kitchen omeprazole (PRILOSEC) 40 MG capsule Take 40 mg by mouth daily.      Marland Kitchen PARoxetine (PAXIL) 20 MG tablet Take 20 mg by mouth every evening.      . polyethylene glycol (MIRALAX / GLYCOLAX) packet Take 17 g by mouth daily.      Marland Kitchen senna-docusate (SENOKOT-S) 8.6-50 MG per tablet Take 1 tablet by mouth 2 (two) times daily. While taking pain meds to prevent constipation.  30 tablet  1   No current facility-administered medications for this visit.   Facility-Administered Medications Ordered in Other Visits  Medication Dose Route Frequency Provider Last Rate Last Dose  . oxyCODONE-acetaminophen (PERCOCET/ROXICET) 5-325 MG per tablet 1 tablet  1 tablet Oral Once Artis Delay, MD        REVIEW OF  SYSTEMS:   Ears, nose, mouth, throat, and face: Denies mucositis or sore throat. She complained of dry mouth Respiratory: Denies cough, dyspnea or wheezes Cardiovascular: Denies palpitation, chest discomfort or lower extremity swelling Skin: Denies abnormal skin rashes Lymphatics: Denies new lymphadenopathy or easy bruising Neurological:Denies numbness, tingling or new weaknesses Behavioral/Psych: Mood is stable, no new changes  All other systems were reviewed with the patient and are negative.  PHYSICAL EXAMINATION: ECOG PERFORMANCE STATUS: 2 - Symptomatic, <50% confined to bed  Filed Vitals:   10/21/13 0845  BP: 155/85  Pulse: 82  Temp: 97.4 F (36.3 C)  Resp: 20   Filed Weights   10/21/13 0845  Weight: 123 lb 9.6 oz (56.065 kg)    GENERAL:alert, in severe distress  from headaches. She looks thin and cachectic SKIN: skin color, texture, turgor are normal, no rashes or significant lesions EYES: normal, conjunctiva are pink and non-injected, sclera clear OROPHARYNX:no exudate, no erythema and lips, buccal mucosa, and tongue normal . She has significant dry mouth NECK: supple, thyroid normal size, non-tender, without nodularity LYMPH:  no palpable lymphadenopathy in the cervical, axillary or inguinal LUNGS: clear to auscultation and percussion with normal breathing effort HEART: regular rate & rhythm and no murmurs and no lower extremity edema ABDOMEN:abdomen soft, non-tender and normal bowel sounds Musculoskeletal:no cyanosis of digits and no clubbing  PSYCH: alert & oriented x 3 with fluent speech NEURO: no focal motor/sensory deficits  LABORATORY DATA:  I have reviewed the data as listed Lab Results  Component Value Date   WBC 11.2* 10/15/2013   HGB 10.2* 10/15/2013   HCT 30.3* 10/15/2013   MCV 78.9 10/15/2013   PLT 267 10/15/2013   Lab Results  Component Value Date   NA 137 10/15/2013   K 2.4* 10/15/2013   CL 98 10/15/2013   CO2 25 10/15/2013    RADIOGRAPHIC  STUDIES: I reviewed the imaging studies as follows and agreed there were no evidence of intracranial metastasis I have personally reviewed the radiological images as listed and agreed with the findings in the report. Ct Head Wo Contrast  10/15/2013   CLINICAL DATA:  Headache, nausea, history of lung cancer  EXAM: CT HEAD WITHOUT CONTRAST  TECHNIQUE: Contiguous axial images were obtained from the base of the skull through the vertex without intravenous contrast.  COMPARISON:  09/30/2013  FINDINGS: No skull fracture is noted. Paranasal sinuses and mastoid air cells are unremarkable. No intracranial hemorrhage, mass effect or midline shift.  No acute infarction. No mass lesion is noted on this unenhanced scan. Mild cerebral atrophy.  IMPRESSION: No acute intracranial abnormality.  Stable cerebral atrophy.   Electronically Signed   By: Natasha Mead M.D.   On: 10/15/2013 14:17   Mr Laqueta Jean ZO Contrast  10/15/2013   CLINICAL DATA:  Severe headache. History of renal cell carcinoma and lung cancer.  EXAM: MRI HEAD WITHOUT AND WITH CONTRAST  TECHNIQUE: Multiplanar, multiecho pulse sequences of the brain and surrounding structures were obtained without and with intravenous contrast.  CONTRAST:  12mL MULTIHANCE GADOBENATE DIMEGLUMINE 529 MG/ML IV SOLN  COMPARISON:  Head CT 10/15/2013 and brain MRI 09/30/2013  FINDINGS: Patchy areas of T2 hyperintensity within the subcortical and deep cerebral white matter and pons are unchanged and compatible with mild chronic small vessel ischemic disease. There is no evidence of acute infarct, intracranial hemorrhage, mass, midline shift, or extra-axial fluid collection. There is mild generalized cerebral atrophy. There is no abnormal enhancement. Prior bilateral cataract surgery is noted. Small amount of frothy secretion in the sphenoid sinus has decreased from the prior exam. Small, left larger than right mastoid effusions are unchanged. Major intracranial vascular flow voids are  unremarkable.  IMPRESSION: Stable appearance of the brain. No evidence of acute intracranial abnormality or intracranial metastasis.   Electronically Signed   By: Sebastian Ache   On: 10/15/2013 16:48   ASSESSMENT:  #1 uncontrolled severe headaches #2 dehydration #3 multi resistant urinary tract infection #4 uncontrolled blood pressure #5 metastatic renal cell carcinoma to liver and lungs PLAN:  #1 uncontrolled severe headaches I think the cause of her headaches is due to widely fluctuating blood pressure, dehydration and uncontrolled infection. I will give her pain medicine as needed, IV fluid resuscitation and close monitoring of  her blood pressure. #2 dehydration I will give her IV fluid resuscitation #3 multi resistant urinary tract infection I will repeat urine culture. I will start her on ceftriaxone based on the most recent susceptibility. Due to recurrent urinary tract infection, she would need minimum 10 day course of intravenous antibiotics #4 uncontrolled blood pressure She had uncontrolled blood pressure ranging from very low too high. The patient cannot tolerate such white fluctuation of blood pressure. She refused to take Florinef and has not taken this for at least 5 days. Her blood pressure in my office is actually high. I will hold off resuming her medications unless truly necessary #5 metastatic renal cell carcinoma to liver and lungs We'll continue to hold systemic chemotherapy due to multiple unresolved medical comorbidities #6 hypokalemia We will replace #7 DVT prophylaxis We'll start her on Lovenox #8 microcytic anemia This is likely a combination of anemia chronic disease and possibly iron deficiency. Which iron panel tomorrow  Estimated length of stay minimum 10 days due to multiresistant urinary tract infection. The risk of not admitting her would be death from sepsis. All questions were answered.   Isbella Arline, MD 10/21/2013 9:05 AM

## 2013-10-22 DIAGNOSIS — E2749 Other adrenocortical insufficiency: Secondary | ICD-10-CM

## 2013-10-22 LAB — BASIC METABOLIC PANEL
CO2: 27 mEq/L (ref 19–32)
Calcium: 8.6 mg/dL (ref 8.4–10.5)
Chloride: 101 mEq/L (ref 96–112)
GFR calc Af Amer: 73 mL/min — ABNORMAL LOW (ref >90)
GFR calc non Af Amer: 63 mL/min — ABNORMAL LOW (ref >90)
Glucose, Bld: 99 mg/dL (ref 70–99)
Potassium: 3.3 mEq/L — ABNORMAL LOW (ref 3.7–5.3)
Sodium: 139 mEq/L (ref 137–147)

## 2013-10-22 LAB — CBC WITH DIFFERENTIAL/PLATELET
Eosinophils Absolute: 0.5 10*3/uL (ref 0.0–0.7)
Eosinophils Relative: 4 % (ref 0–5)
HCT: 29.2 % — ABNORMAL LOW (ref 36.0–46.0)
Hemoglobin: 9.3 g/dL — ABNORMAL LOW (ref 12.0–15.0)
Lymphocytes Relative: 27 % (ref 12–46)
Lymphs Abs: 2.9 10*3/uL (ref 0.7–4.0)
MCH: 26.1 pg (ref 26.0–34.0)
MCV: 82 fL (ref 78.0–100.0)
Monocytes Absolute: 0.8 10*3/uL (ref 0.1–1.0)
Monocytes Relative: 8 % (ref 3–12)
Neutrophils Relative %: 61 % (ref 43–77)
RBC: 3.56 MIL/uL — ABNORMAL LOW (ref 3.87–5.11)
WBC: 10.6 10*3/uL — ABNORMAL HIGH (ref 4.0–10.5)

## 2013-10-22 LAB — URINE CULTURE
Colony Count: NO GROWTH
Culture: NO GROWTH

## 2013-10-22 MED ORDER — PREDNISONE 10 MG PO TABS
10.0000 mg | ORAL_TABLET | Freq: Two times a day (BID) | ORAL | Status: DC
  Administered 2013-10-22 – 2013-10-23 (×2): 10 mg via ORAL
  Filled 2013-10-22 (×4): qty 1

## 2013-10-22 NOTE — Progress Notes (Signed)
Kaylee [name]   DOB:27-Dec-1940   ZO#:[id number]    Subjective: She is feeling better. She required intermittent doses of oxycodone to control her headaches. Denies any dysuria, frequency or urgency. The nausea has resolved. She denies any recent fever, chills, night sweats or abnormal weight loss. Appetite is improving  Objective:  Filed Vitals:   10/22/13 0437  BP: 156/66  Pulse: 75  Temp: 97.9 F (36.6 C)  Resp: 20     Intake/Output Summary (Last 24 hours) at 10/22/13 1109 Last data filed at 10/22/13 9811  Gross per 24 hour  Intake 2445.75 ml  Output   1875 ml  Net 570.75 ml    GENERAL:alert, no distress and comfortable SKIN: skin color, texture, turgor are normal, no rashes or significant lesions EYES: normal, Conjunctiva are pink and non-injected, sclera clear OROPHARYNX:no exudate, no erythema and lips, buccal mucosa, and tongue normal  NECK: supple, thyroid normal size, non-tender, without nodularity LYMPH:  no palpable lymphadenopathy in the cervical, axillary or inguinal LUNGS: clear to auscultation and percussion with normal breathing effort HEART: regular rate & rhythm and no murmurs with mild bilateral lower extremity edema ABDOMEN:abdomen soft, non-tender and normal bowel sounds Musculoskeletal:no cyanosis of digits and no clubbing  NEURO: alert & oriented x 3 with fluent speech, no focal motor/sensory deficits   Labs:  Lab Results  Component Value Date   WBC 10.6* 10/22/2013   HGB 9.3* 10/22/2013   HCT 29.2* 10/22/2013   MCV 82.0 10/22/2013   PLT 221 10/22/2013   NEUTROABS 6.5 10/22/2013    Lab Results  Component Value Date   NA 139 10/22/2013   K 3.3* 10/22/2013   CL 101 10/22/2013   CO2 27 10/22/2013   Urine culture is pending Assessment & Plan:  #1 severe headaches, resolving  I think the cause of her headaches is due to widely fluctuating blood pressure, dehydration and uncontrolled infection.  I will give her pain medicine as needed and  close monitoring of her blood pressure.  #2 dehydration, resolved I will stop her IV fluid resuscitation and recommend increase oral intake #3 multi resistant urinary tract infection  Repeat urine culture is pending. I will continue her on ceftriaxone based on the most recent susceptibility. Due to recurrent urinary tract infection, she would need minimum 10 day course of intravenous antibiotics  #4 uncontrolled blood pressure  She had uncontrolled blood pressure ranging from very low too high. I will hold off resuming her medications unless truly necessary  #5 metastatic renal cell carcinoma to liver and lungs  We'll continue to hold systemic chemotherapy due to multiple unresolved medical comorbidities  #6 hypokalemia  We will replace  #7 DVT prophylaxis  We'll start her on Lovenox  #8 microcytic anemia  This is likely a combination of anemia chronic disease and possibly iron deficiency. Iron panel is pending #9 Calorie malnutrition  Will consult dietitian while hospitalized #10 adrenal insufficiency This is present from previous hospital admission. I will start her on gentle dose of prednisone   Jenita Rayfield, MD 10/22/2013  11:09 AM

## 2013-10-22 NOTE — Progress Notes (Signed)
Came to visit patient to offer [facility] Care Management services on behalf of referral from [facility] program. States she will have home health and does not feel like she needs another agency following up with her at this time. Explained that [facility] Care Management will not interfere or replace her home health services. States "I know that, I will just call you if and when I need your services". Left brochure at bedside for her to call in the future.  Raiford Noble, MSN, Ed, RN,BSN- [facility] [phone]

## 2013-10-22 NOTE — Progress Notes (Signed)
Physical Therapy Treatment Patient Details Name: Kaylee [name] MRN: [id number] DOB: 01/14/1941 Today's Date: 10/22/2013 Time: 4098-1191 PT Time Calculation (min): 13 min  PT Assessment / Plan / Recommendation  History of Present Illness     PT Comments   Pt ambulated in hallway with RW and min/guard for safety.  Pt hopeful to d/c home with family assist tomorrow.  Follow Up Recommendations  Home health PT;Supervision/Assistance - 24 hour (pt declining SNF)     Does the patient have the potential to tolerate intense rehabilitation     Barriers to Discharge        Equipment Recommendations  Rolling walker with 5" wheels    Recommendations for Other Services    Frequency Min 3X/week   Progress towards PT Goals Progress towards PT goals: Progressing toward goals  Plan Current plan remains appropriate    Precautions / Restrictions Precautions Precautions: Fall Precaution Comments: orthostatic hypotension   Pertinent Vitals/Pain N/a Denies dizziness    Mobility  Bed Mobility Bed Mobility: Supine to Sit Supine to Sit: 6: Modified independent (Device/Increase time) Transfers Transfers: Sit to Stand;Stand to Sit Sit to Stand: From bed;4: Min guard Stand to Sit: To chair/3-in-1;4: Min guard Details for Transfer Assistance: min/guard for safety, posterior lean however pt self corrected with lean against bed when tying gown prior to ambulation Ambulation/Gait Ambulation/Gait Assistance: 4: Min guard Ambulation Distance (Feet): 160 Feet Assistive device: Rolling walker Ambulation/Gait Assistance Details: pt only c/o "lightening bolt" type pain shooting in R temple area occasionally during ambulation, standing rest break against wall halfway Gait Pattern: Step-through pattern;Decreased stride length;Narrow base of support General Gait Details: HIGH FALL RISK    Exercises     PT Diagnosis:    PT Problem List:   PT Treatment Interventions:     PT Goals (current  goals can now be found in the care plan section)    Visit Information  Last PT Received On: 10/22/13 Assistance Needed: +1    Subjective Data      Cognition  Cognition Arousal/Alertness: Awake/alert Behavior During Therapy: [facility] for tasks assessed/performed Overall Cognitive Status: Within Functional Limits for tasks assessed    Balance     End of Session PT - End of Session Activity Tolerance: Patient tolerated treatment well Patient left: in chair;with call bell/phone within reach;with family/visitor present Nurse Communication: Mobility status (nsg tech aware pt up in recliner with sister in room)   GP     Merek Niu,KATHrine E 10/22/2013, 3:54 PM Zenovia Jarred, PT, DPT 10/22/2013 Pager: [phone]

## 2013-10-22 NOTE — Progress Notes (Signed)
Patient with "sensitive feet" and does not like "anything touching them". Refused ted hose. Has lovenox ordered for VTE.

## 2013-10-23 ENCOUNTER — Other Ambulatory Visit: Payer: Self-pay | Admitting: Hematology and Oncology

## 2013-10-23 DIAGNOSIS — C642 Malignant neoplasm of left kidney, except renal pelvis: Secondary | ICD-10-CM

## 2013-10-23 DIAGNOSIS — E871 Hypo-osmolality and hyponatremia: Secondary | ICD-10-CM

## 2013-10-23 LAB — CBC WITH DIFFERENTIAL/PLATELET
Basophils Absolute: 0 10*3/uL (ref 0.0–0.1)
Basophils Relative: 0 % (ref 0–1)
Eosinophils Absolute: 0.1 10*3/uL (ref 0.0–0.7)
Eosinophils Relative: 1 % (ref 0–5)
HCT: 28.9 % — ABNORMAL LOW (ref 36.0–46.0)
Hemoglobin: 9.4 g/dL — ABNORMAL LOW (ref 12.0–15.0)
Lymphocytes Relative: 20 % (ref 12–46)
Lymphs Abs: 2 10*3/uL (ref 0.7–4.0)
MCH: 26.4 pg (ref 26.0–34.0)
MCHC: 32.5 g/dL (ref 30.0–36.0)
MCV: 81.2 fL (ref 78.0–100.0)
Monocytes Absolute: 0.6 10*3/uL (ref 0.1–1.0)
Monocytes Relative: 6 % (ref 3–12)
Neutro Abs: 7.1 10*3/uL (ref 1.7–7.7)
Neutrophils Relative %: 73 % (ref 43–77)
Platelets: 204 10*3/uL (ref 150–400)
RBC: 3.56 MIL/uL — ABNORMAL LOW (ref 3.87–5.11)
RDW: 17.1 % — ABNORMAL HIGH (ref 11.5–15.5)
WBC: 9.8 10*3/uL (ref 4.0–10.5)

## 2013-10-23 LAB — BASIC METABOLIC PANEL
BUN: 16 mg/dL (ref 6–23)
CO2: 26 mEq/L (ref 19–32)
Calcium: 9 mg/dL (ref 8.4–10.5)
Chloride: 99 mEq/L (ref 96–112)
Creatinine, Ser: 0.74 mg/dL (ref 0.50–1.10)
GFR calc Af Amer: >90 mL/min (ref >90)
GFR calc non Af Amer: 82 mL/min — ABNORMAL LOW (ref >90)
Glucose, Bld: 215 mg/dL — ABNORMAL HIGH (ref 70–99)
Potassium: 4.6 mEq/L (ref 3.7–5.3)
Sodium: 135 mEq/L — ABNORMAL LOW (ref 137–147)

## 2013-10-23 LAB — IRON AND TIBC
Iron: 19 ug/dL — ABNORMAL LOW (ref 42–135)
Saturation Ratios: 10 % — ABNORMAL LOW (ref 20–55)
TIBC: 190 ug/dL — ABNORMAL LOW (ref 250–470)
UIBC: 171 ug/dL (ref 125–400)

## 2013-10-23 LAB — FERRITIN: Ferritin: 289 ng/mL (ref 10–291)

## 2013-10-23 MED ORDER — PREDNISONE 10 MG PO TABS: 10.0000 mg | ORAL_TABLET | Freq: Two times a day (BID) | ORAL | Status: DC

## 2013-10-23 MED ORDER — OXYCODONE HCL 10 MG PO TABS: 10.0000 mg | ORAL_TABLET | ORAL | Status: DC | PRN

## 2013-10-23 MED ORDER — BUTALBITAL-APAP-CAFFEINE 50-325-40 MG PO TABS: 1.0000 | ORAL_TABLET | Freq: Four times a day (QID) | ORAL | Status: DC | PRN

## 2013-10-23 MED ORDER — PROMETHAZINE HCL 25 MG PO TABS: 25.0000 mg | ORAL_TABLET | Freq: Four times a day (QID) | ORAL | Status: DC | PRN

## 2013-10-23 MED ORDER — NITROFURANTOIN MACROCRYSTAL 50 MG PO CAPS: 50.0000 mg | ORAL_CAPSULE | Freq: Every day | ORAL | Status: DC

## 2013-10-23 NOTE — Progress Notes (Signed)
Discharge instructions reviewed with patient and she verbalized understanding using teach back method.  Patient appears stable for discharge.  Assessment unchanged from this am.  Kaylee [name] [facility]  10/23/2013  3:28 PM

## 2013-10-23 NOTE — Discharge Summary (Signed)
Physician Discharge Summary  Patient ID: Kaylee [name] MRN: [id number] DOB/AGE: Aug 24, 1941 73 y.o.  Admit date: 10/21/2013 Discharge date: 10/23/2013  Primary Care Physician:  [name], MD   Discharge Diagnoses:   #1 recurrent urinary tract infection #2 adrenal insufficiency #3 hypokalemia and hyponatremia due to adrenal insufficiency #4 severe headache due to fluctuation of blood pressure #5 anemia of chronic disease #6 metastatic renal cell carcinoma not on treatment  Present on Admission:  . UTI (lower urinary tract infection)  Discharge Medications:    Medication List    STOP taking these medications       fludrocortisone 0.1 MG tablet  Commonly known as:  FLORINEF     midodrine 10 MG tablet  Commonly known as:  PROAMATINE      TAKE these medications       ALPRAZolam 0.25 MG tablet  Commonly known as:  XANAX  Take 1 tablet (0.25 mg total) by mouth 2 (two) times daily as needed for anxiety or sleep.     butalbital-acetaminophen-caffeine 50-325-40 MG per tablet  Commonly known as:  FIORICET  Take 1 tablet by mouth every 6 (six) hours as needed for headache.     nitrofurantoin 50 MG capsule  Commonly known as:  MACRODANTIN  Take 1 capsule (50 mg total) by mouth at bedtime.     omeprazole 40 MG capsule  Commonly known as:  PRILOSEC  Take 40 mg by mouth daily.     Oxycodone HCl 10 MG Tabs  Take 1 tablet (10 mg total) by mouth every 3 (three) hours as needed for severe pain.     PARoxetine 20 MG tablet  Commonly known as:  PAXIL  Take 20 mg by mouth every evening.     polyethylene glycol packet  Commonly known as:  MIRALAX / GLYCOLAX  Take 17 g by mouth daily.     predniSONE 10 MG tablet  Commonly known as:  DELTASONE  Take 1 tablet (10 mg total) by mouth 2 (two) times daily with a meal.     promethazine 25 MG tablet  Commonly known as:  PHENERGAN  Take 1 tablet (25 mg total) by mouth every 6 (six) hours as needed for nausea.      senna-docusate 8.6-50 MG per tablet  Commonly known as:  Senokot-S  Take 1 tablet by mouth 2 (two) times daily. While taking pain meds to prevent constipation.       Repeat urine culture on December 30 showed no growth Disposition and Follow-up:  Followup in oncology clinic on 10/31/2013  Discharge Laboratory Values: Lab Results  Component Value Date   WBC 9.8 10/23/2013   HGB 9.4* 10/23/2013   HCT 28.9* 10/23/2013   MCV 81.2 10/23/2013   PLT 204 10/23/2013   Lab Results  Component Value Date   NA 135* 10/23/2013   K 4.6 10/23/2013   CL 99 10/23/2013   CO2 26 10/23/2013    Brief H and P: For complete details please refer to admission H and P, but in brief, the patient was admitted due to uncontrolled headaches, recurrent urinary tract infection, severe hypokalemia, fluctuation of blood pressure, dehydration and severe nausea  Physical Exam at Discharge: BP 152/68  Pulse 71  Temp(Src) 97.8 F (36.6 C) (Oral)  Resp 20  Ht 5\' 7"  (1.702 m)  Wt 123 lb (55.792 kg)  BMI 19.26 kg/m2  SpO2 99% Gen: The patient feels well, alert oriented x3 Cardiovascular: Regular rate and rhythm no murmurs no lower  extremity edema Respiratory: Clear auscultation no crackles Gastrointestinal: Soft, nontender with normal bowel sounds Extremities: Normal bilateral lower extremity with no edema  Hospital Course:  #1 severe headaches, resolving  I think the cause of her headaches is due to widely fluctuating blood pressure, dehydration and uncontrolled infection.  I will give her pain medicine as needed and close monitoring of her blood pressure.  #2 dehydration, resolved  #3 multi resistant urinary tract infection  Repeat urine culture is negative.  Due to recurrent urinary tract infection, I gave her prescription of nitrofurantoin to take every day as prophylaxis #4 uncontrolled blood pressure  She had uncontrolled blood pressure ranging from very low too high.  Her blood pressure is reasonably  controlled while hospitalized. She will not be discharged on antihypertensives #5 metastatic renal cell carcinoma to liver and lungs  We'll continue to hold systemic chemotherapy due to multiple unresolved medical comorbidities  #6 hypokalemia, resolved #7 microcytic anemia  This is likely a combination of anemia chronic disease and possibly iron deficiency. She does the symptoms at discharge he did not require blood transfusion  #8 Calorie malnutrition  I encouraged oral intake #9 adrenal insufficiency  This is present from previous hospital admission. The patient will be discharged on gentle dose prednisone and to initiate slow taper as outpatient. #10 severe weakness She was assessed by physical therapy. The patient have walker to use at home   Diet:  Regular  Activity:  As tolerated  Condition at Discharge:   Stable  Signed: Dr. Heath Lark [phone]  10/23/2013, 2:39 PM Spent 40 minutes on discharge

## 2013-10-24 ENCOUNTER — Telehealth: Payer: Self-pay | Admitting: Hematology and Oncology

## 2013-10-24 NOTE — Telephone Encounter (Signed)
s.w. pt and advised on Jan appt.....pt ok and aware °

## 2013-10-24 NOTE — Progress Notes (Signed)
Discharge summary sent to payer through [facility]  

## 2013-10-31 ENCOUNTER — Ambulatory Visit (HOSPITAL_BASED_OUTPATIENT_CLINIC_OR_DEPARTMENT_OTHER): Payer: Medicare HMO | Admitting: Hematology and Oncology

## 2013-10-31 ENCOUNTER — Encounter: Payer: Self-pay | Admitting: Hematology and Oncology

## 2013-10-31 ENCOUNTER — Other Ambulatory Visit (HOSPITAL_BASED_OUTPATIENT_CLINIC_OR_DEPARTMENT_OTHER): Payer: Medicare HMO

## 2013-10-31 ENCOUNTER — Telehealth: Payer: Self-pay | Admitting: Hematology and Oncology

## 2013-10-31 VITALS — BP 118/60 | HR 79 | Temp 97.7°F | Resp 18 | Ht 67.0 in | Wt 121.4 lb

## 2013-10-31 DIAGNOSIS — R634 Abnormal weight loss: Secondary | ICD-10-CM

## 2013-10-31 DIAGNOSIS — C649 Malignant neoplasm of unspecified kidney, except renal pelvis: Secondary | ICD-10-CM

## 2013-10-31 DIAGNOSIS — E871 Hypo-osmolality and hyponatremia: Secondary | ICD-10-CM

## 2013-10-31 DIAGNOSIS — D72829 Elevated white blood cell count, unspecified: Secondary | ICD-10-CM

## 2013-10-31 DIAGNOSIS — C642 Malignant neoplasm of left kidney, except renal pelvis: Secondary | ICD-10-CM

## 2013-10-31 DIAGNOSIS — C787 Secondary malignant neoplasm of liver and intrahepatic bile duct: Secondary | ICD-10-CM

## 2013-10-31 DIAGNOSIS — E2749 Other adrenocortical insufficiency: Secondary | ICD-10-CM

## 2013-10-31 DIAGNOSIS — C7800 Secondary malignant neoplasm of unspecified lung: Secondary | ICD-10-CM

## 2013-10-31 DIAGNOSIS — D649 Anemia, unspecified: Secondary | ICD-10-CM

## 2013-10-31 DIAGNOSIS — F17200 Nicotine dependence, unspecified, uncomplicated: Secondary | ICD-10-CM

## 2013-10-31 LAB — CBC WITH DIFFERENTIAL/PLATELET
BASO%: 0.6 % (ref 0.0–2.0)
Basophils Absolute: 0.1 10*3/uL (ref 0.0–0.1)
EOS%: 1.9 % (ref 0.0–7.0)
Eosinophils Absolute: 0.3 10*3/uL (ref 0.0–0.5)
HCT: 33.5 % — ABNORMAL LOW (ref 34.8–46.6)
HGB: 10.8 g/dL — ABNORMAL LOW (ref 11.6–15.9)
LYMPH%: 15.9 % (ref 14.0–49.7)
MCH: 26.5 pg (ref 25.1–34.0)
MCHC: 32.4 g/dL (ref 31.5–36.0)
MCV: 81.6 fL (ref 79.5–101.0)
MONO#: 1 10*3/uL — ABNORMAL HIGH (ref 0.1–0.9)
MONO%: 6.4 % (ref 0.0–14.0)
NEUT#: 12.3 10*3/uL — ABNORMAL HIGH (ref 1.5–6.5)
NEUT%: 75.2 % (ref 38.4–76.8)
Platelets: 326 10*3/uL (ref 145–400)
RBC: 4.1 10*6/uL (ref 3.70–5.45)
RDW: 18.9 % — ABNORMAL HIGH (ref 11.2–14.5)
WBC: 16.4 10*3/uL — ABNORMAL HIGH (ref 3.9–10.3)
lymph#: 2.6 10*3/uL (ref 0.9–3.3)

## 2013-10-31 LAB — COMPREHENSIVE METABOLIC PANEL (CC13)
ALT: 12 U/L (ref 0–55)
AST: 8 U/L (ref 5–34)
Albumin: 3.5 g/dL (ref 3.5–5.0)
Alkaline Phosphatase: 105 U/L (ref 40–150)
Anion Gap: 11 mEq/L (ref 3–11)
BUN: 20.6 mg/dL (ref 7.0–26.0)
CALCIUM: 9.8 mg/dL (ref 8.4–10.4)
CO2: 25 meq/L (ref 22–29)
CREATININE: 1 mg/dL (ref 0.6–1.1)
Chloride: 99 mEq/L (ref 98–109)
Glucose: 125 mg/dl (ref 70–140)
Potassium: 3.9 mEq/L (ref 3.5–5.1)
Sodium: 135 mEq/L — ABNORMAL LOW (ref 136–145)
Total Bilirubin: 0.35 mg/dL (ref 0.20–1.20)
Total Protein: 7.5 g/dL (ref 6.4–8.3)

## 2013-10-31 MED ORDER — LIDOCAINE-PRILOCAINE 2.5-2.5 % EX CREA: 1.0000 "application " | TOPICAL_CREAM | CUTANEOUS | Status: DC | PRN

## 2013-10-31 NOTE — Telephone Encounter (Signed)
gv adn printed appt sched and avs for pt for Jan...sed added tx.

## 2013-10-31 NOTE — Patient Instructions (Signed)
Temsirolimus injection What is this medicine? TEMSIROLIMUS (TEM sir OH li mus) is a drug that alters immune system response in the body. It is used to treat renal cell cancer. This medicine may be used for other purposes; ask your health care provider or pharmacist if you have questions. COMMON BRAND NAME(S): Torisel What should I tell my health care provider before I take this medicine? They need to know if you have any of these conditions: -diabetes -heart disease -high cholesterol -immune system problems -infection (especially a virus infection such as chickenpox, cold sores, or herpes) -liver disease -low blood counts, like low white cell, platelet, or red cell counts -lung or breathing disease, like asthma -take medicines that treat or prevent blood clots -an unusual or allergic reaction to temsirolimus, polysorbate 80, other medicines, foods, dyes, or preservatives -pregnant or trying to get pregnant -breast-feeding How should I use this medicine? This medicine is for infusion into a vein. It is given by a health care professional in a hospital or clinic setting. Talk to your pediatrician regarding the use of this medicine in children. Special care may be needed. Overdosage: If you think you have taken too much of this medicine contact a poison control center or emergency room at once. NOTE: This medicine is only for you. Do not share this medicine with others. What if I miss a dose? It is important not to miss your dose. Call your doctor or health care professional if you are unable to keep an appointment. What may interact with this medicine? Do not take this medicine with any of the following medications: -grapefruit juice -St. John's Wort This medicine may also interact with the following medications: -carbamazepine -dexamethasone -nefazodone -phenobarbital -phenytoin -medicines for heart or kidney problems like captopril, lisinopril -medicines for infection like  clarithromycin, itraconazole, ketoconazole, rifabutin, rifampin, telithromycin -some medicines for HIV like atazanavir, indinavir, nelfinavir, ritonavir, saquinavir -sunitinib -vaccines This list may not describe all possible interactions. Give your health care provider a list of all the medicines, herbs, non-prescription drugs, or dietary supplements you use. Also tell them if you smoke, drink alcohol, or use illegal drugs. Some items may interact with your medicine. What should I watch for while using this medicine? Visit your doctor for regular check-ups. you will need important blood work done while you are taking this medicine. This drug may make you feel generally unwell. This is not uncommon, as chemotherapy can affect healthy cells as well as cancer cells. Report any side effects. Continue your course of treatment even though you feel ill unless your doctor tells you to stop. Call your doctor or health care professional for advice if you get a fever, chills or sore throat, or other symptoms of a cold or flu. Do not treat yourself. This drug decreases your body's ability to fight infections. Try to avoid being around people who are sick. This medicine may increase your risk to bruise or bleed. Call your doctor or health care professional if you notice any unusual bleeding. Be careful brushing and flossing your teeth or using a toothpick because you may get an infection or bleed more easily. If you have any dental work done, tell your dentist you are receiving this medicine. Avoid taking products that contain aspirin, acetaminophen, ibuprofen, naproxen, or ketoprofen unless instructed by your doctor. These medicines may hide a fever. Do not become pregnant while taking this medicine. Women should inform their doctor if they wish to become pregnant or think they might be pregnant. There is  a potential for serious side effects to an unborn child. Women who are able to have children should use  effective birth control before, during, and for 12 weeks after stopping this medicine. Talk to your health care professional or pharmacist for more information. Do not breast-feed an infant while taking this medicine. What side effects may I notice from receiving this medicine? Side effects that you should report to your doctor or health care professional as soon as possible: -allergic reactions like skin rash, itching or hives, swelling of the face, lips, or tongue -breathing problems, cough -chest pain -dizziness -fever or chills, sore throat -hallucination, loss of contact with reality -increased hunger or thirst -increased urination -pain, swelling, warmth in the leg -redness, blistering, peeling or loosening of the skin, including inside the mouth -seizures -swelling of the legs or ankles -trouble passing urine or change in the amount of urine -unusual bleeding or bruising -unusually weak or tired Side effects that usually do not require medical attention (report to your prescriber or health care professional if they continue or are bothersome): -constipation -loss of appetite -mouth sores -nausea, vomiting -stomach pain This list may not describe all possible side effects. Call your doctor for medical advice about side effects. You may report side effects to FDA at 1-800-FDA-1088. Where should I keep my medicine? This drug is given in a hospital or clinic and will not be stored at home. NOTE: This sheet is a summary. It may not cover all possible information. If you have questions about this medicine, talk to your doctor, pharmacist, or health care provider.  2014, [employer]vier/Gold Standard. (2008-07-06 16:31:28)

## 2013-10-31 NOTE — Progress Notes (Signed)
[facility] OFFICE PROGRESS NOTE  Patient Care Team: [name], MD as PCP - General (Internal Medicine) Heath Lark, MD as Consulting Physician (Hematology and Oncology)  DIAGNOSIS: Stage IV metastatic kidney cancer  SUMMARY OF ONCOLOGIC HISTORY: Oncology History   Renal cell carcinoma   Primary site: Kidney (Left)   Staging method: AJCC 7th Edition   Clinical: Stage IV (T3a, N0, M1) signed by Heath Lark, MD on 10/21/2013  8:46 AM   Pathologic: Stage IV (T3a, N0, M1) signed by Heath Lark, MD on 10/21/2013  8:47 AM   Summary: Stage IV (T3a, N0, M1)       Renal cell carcinoma   10/30/2010 Imaging Negative CT abdomen   09/15/2013 Surgery Patient underwent left nephrectomy which confirmed RCC, Fuhrman grade III/IV   09/27/2013 Imaging CT abdomen showed liver metastasis   09/29/2013 Procedure CT guided liver biopsy confrimed metastatic disease   09/29/2013 Imaging CT chest showed enlarging pulmonary metastases   09/30/2013 - 10/10/2013 Hospital Admission Admitted for orthostatic hypotension and headaches and severe malnutrition   10/21/2013 - 10/23/2013 Hospital Admission She was admitted to the hospital with recurrent urinary tract infection, dehydration, severe headaches and adrenal insufficiency.    INTERVAL HISTORY: Kaylee [name] 73 y.o. female returns for further followup. She is feeling much better. She denies any nausea. No recent infection. Her appetite is good but she is losing weight. She is to have mild intermittent headaches but not as severe as before. Denies any frequency, urgency or dysuria  I have reviewed the past medical history, past surgical history, social history and family history with the patient and they are unchanged from previous note.  ALLERGIES:  is allergic to penicillins and codeine.  MEDICATIONS:  Current Outpatient Prescriptions  Medication Sig Dispense Refill  . ALPRAZolam (XANAX) 0.25 MG tablet Take 1 tablet (0.25 mg total)  by mouth 2 (two) times daily as needed for anxiety or sleep.  30 tablet  0  . butalbital-acetaminophen-caffeine (FIORICET) 50-325-40 MG per tablet Take 1 tablet by mouth every 6 (six) hours as needed for headache.  60 tablet  1  . nitrofurantoin (MACRODANTIN) 50 MG capsule Take 1 capsule (50 mg total) by mouth at bedtime.  30 capsule  1  . omeprazole (PRILOSEC) 40 MG capsule Take 40 mg by mouth daily.      Marland Kitchen oxyCODONE 10 MG TABS Take 1 tablet (10 mg total) by mouth every 3 (three) hours as needed for severe pain.  60 tablet  0  . PARoxetine (PAXIL) 20 MG tablet Take 20 mg by mouth every evening.      . polyethylene glycol (MIRALAX / GLYCOLAX) packet Take 17 g by mouth daily.      . predniSONE (DELTASONE) 10 MG tablet Take 1 tablet (10 mg total) by mouth 2 (two) times daily with a meal.  60 tablet  1  . promethazine (PHENERGAN) 25 MG tablet Take 1 tablet (25 mg total) by mouth every 6 (six) hours as needed for nausea.  30 tablet  3  . senna-docusate (SENOKOT-S) 8.6-50 MG per tablet Take 1 tablet by mouth 2 (two) times daily. While taking pain meds to prevent constipation.  30 tablet  1  . lidocaine-prilocaine (EMLA) cream Apply 1 application topically as needed.  30 g  0   No current facility-administered medications for this visit.    REVIEW OF SYSTEMS:   Eyes: Denies blurriness of vision Ears, nose, mouth, throat, and face: Denies mucositis or sore throat  Respiratory: Denies cough, dyspnea or wheezes Cardiovascular: Denies palpitation, chest discomfort or lower extremity swelling Gastrointestinal:  Denies nausea, heartburn or change in bowel habits Skin: Denies abnormal skin rashes Lymphatics: Denies new lymphadenopathy or easy bruising Neurological:Denies numbness, tingling or new weaknesses Behavioral/Psych: Mood is stable, no new changes  All other systems were reviewed with the patient and are negative.  PHYSICAL EXAMINATION: ECOG PERFORMANCE STATUS: 1 - Symptomatic but completely  ambulatory  Filed Vitals:   10/31/13 1233  BP: 118/60  Pulse: 79  Temp: 97.7 F (36.5 C)  Resp: 18   Filed Weights   10/31/13 1233  Weight: 121 lb 6.4 oz (55.067 kg)    GENERAL:alert, no distress and comfortable Musculoskeletal:no cyanosis of digits and no clubbing  NEURO: alert & oriented x 3 with fluent speech, no focal motor/sensory deficits  LABORATORY DATA:  I have reviewed the data as listed    Component Value Date/Time   NA 135* 10/31/2013 1206   NA 135* 10/23/2013 0409   K 3.9 10/31/2013 1206   K 4.6 10/23/2013 0409   CL 99 10/23/2013 0409   CO2 25 10/31/2013 1206   CO2 26 10/23/2013 0409   GLUCOSE 125 10/31/2013 1206   GLUCOSE 215* 10/23/2013 0409   BUN 20.6 10/31/2013 1206   BUN 16 10/23/2013 0409   CREATININE 1.0 10/31/2013 1206   CREATININE 0.74 10/23/2013 0409   CALCIUM 9.8 10/31/2013 1206   CALCIUM 9.0 10/23/2013 0409   PROT 7.5 10/31/2013 1206   PROT 7.0 10/15/2013 1316   ALBUMIN 3.5 10/31/2013 1206   ALBUMIN 3.5 10/15/2013 1316   AST 8 10/31/2013 1206   AST 12 10/15/2013 1316   ALT 12 10/31/2013 1206   ALT 10 10/15/2013 1316   ALKPHOS 105 10/31/2013 1206   ALKPHOS 81 10/15/2013 1316   BILITOT 0.35 10/31/2013 1206   BILITOT 0.6 10/15/2013 1316   GFRNONAA 82* 10/23/2013 0409   GFRAA >90 10/23/2013 0409    No results found for this basename: SPEP, UPEP,  kappa and lambda light chains    Lab Results  Component Value Date   WBC 16.4* 10/31/2013   NEUTROABS 12.3* 10/31/2013   HGB 10.8* 10/31/2013   HCT 33.5* 10/31/2013   MCV 81.6 10/31/2013   PLT 326 10/31/2013      Chemistry      Component Value Date/Time   NA 135* 10/31/2013 1206   NA 135* 10/23/2013 0409   K 3.9 10/31/2013 1206   K 4.6 10/23/2013 0409   CL 99 10/23/2013 0409   CO2 25 10/31/2013 1206   CO2 26 10/23/2013 0409   BUN 20.6 10/31/2013 1206   BUN 16 10/23/2013 0409   CREATININE 1.0 10/31/2013 1206   CREATININE 0.74 10/23/2013 0409      Component Value Date/Time   CALCIUM 9.8 10/31/2013 1206   CALCIUM 9.0 10/23/2013 0409   ALKPHOS 105 10/31/2013  1206   ALKPHOS 81 10/15/2013 1316   AST 8 10/31/2013 1206   AST 12 10/15/2013 1316   ALT 12 10/31/2013 1206   ALT 10 10/15/2013 1316   BILITOT 0.35 10/31/2013 1206   BILITOT 0.6 10/15/2013 1316     ASSESSMENT & PLAN:  #1 stage IV metastatic kidney cancer Her performance status has improved since recent admission. We discussed the role of chemotherapy. The intent is for palliative.  We discussed some of the risks, benefits, side-effects of Temsirolimus.   Some of the short term side-effects included, though not limited to, risk of fatigue, weight loss,  pancytopenia, life-threatening infections, need for transfusions of blood products, nausea, blood clots, admission to hospital for various reasons, hypertension, hyperglycemia and risks of death.  The patient is aware that the response rates discussed earlier is not guaranteed.    After a long discussion, patient made an informed decision to proceed with the prescribed plan of care and went ahead to sign the consent form today.   Patient education material was dispensed I will go ahead and plan for placement of port and start treatment next week The patient will see me back prior to his week 2 of treatment #2 tobacco abuse I spent some time educating the patient importance of nicotine cessation #3 adrenal insufficiency Currently she is taking prednisone 10 mg in the morning and 5 mg in the evening. I plan to start initially tapered to 5 mg twice a day #4 leukocytosis This is likely due to recent treatment. The patient denies recent history of fevers, cough, chills, diarrhea or dysuria. She is asymptomatic from the leukopenia. I will observe for now.   #5 weight loss This is due to untreated cancer. I recommend she increase oral intake. She is currently on prednisone #6 hyponatremia This is due to adrenal insufficiency. We will observe #7 anemia This is likely anemia of chronic disease. The patient denies recent history of bleeding such as  epistaxis, hematuria or hematochezia. She is asymptomatic from the anemia. We will observe for now.  She does not require transfusion now.   Orders Placed This Encounter  Procedures  . IR Fluoro Guide CV Line Left    Indicate type of CVC ordering    Standing Status: Future     Number of Occurrences:      Standing Expiration Date: 12/30/2014    Order Specific Question:  Reason for exam:    Answer:  port placement for chemo    Order Specific Question:  Preferred Imaging Location?    Answer:  [facility]   All questions were answered. The patient knows to call the clinic with any problems, questions or concerns. No barriers to learning was detected.    [facility], [place], MD 10/31/2013 2:29 PM

## 2013-11-03 ENCOUNTER — Encounter (HOSPITAL_COMMUNITY): Payer: Self-pay | Admitting: Pharmacy Technician

## 2013-11-04 ENCOUNTER — Other Ambulatory Visit: Payer: Self-pay | Admitting: Radiology

## 2013-11-05 ENCOUNTER — Ambulatory Visit (HOSPITAL_COMMUNITY)
Admission: RE | Admit: 2013-11-05 | Discharge: 2013-11-05 | Disposition: A | Discharge status: Home / Self Care | Payer: Medicare HMO | Source: Ambulatory Visit | Attending: Hematology and Oncology | Admitting: Hematology and Oncology

## 2013-11-05 ENCOUNTER — Telehealth: Payer: Self-pay | Admitting: Hematology and Oncology

## 2013-11-05 ENCOUNTER — Encounter (HOSPITAL_COMMUNITY): Payer: Self-pay

## 2013-11-05 ENCOUNTER — Other Ambulatory Visit: Payer: Self-pay | Admitting: Hematology and Oncology

## 2013-11-05 DIAGNOSIS — F329 Major depressive disorder, single episode, unspecified: Secondary | ICD-10-CM | POA: Insufficient documentation

## 2013-11-05 DIAGNOSIS — F3289 Other specified depressive episodes: Secondary | ICD-10-CM | POA: Insufficient documentation

## 2013-11-05 DIAGNOSIS — Z880 Allergy status to penicillin: Secondary | ICD-10-CM | POA: Insufficient documentation

## 2013-11-05 DIAGNOSIS — J4489 Other specified chronic obstructive pulmonary disease: Secondary | ICD-10-CM | POA: Insufficient documentation

## 2013-11-05 DIAGNOSIS — M549 Dorsalgia, unspecified: Secondary | ICD-10-CM | POA: Insufficient documentation

## 2013-11-05 DIAGNOSIS — Z85118 Personal history of other malignant neoplasm of bronchus and lung: Secondary | ICD-10-CM | POA: Insufficient documentation

## 2013-11-05 DIAGNOSIS — C649 Malignant neoplasm of unspecified kidney, except renal pelvis: Secondary | ICD-10-CM

## 2013-11-05 DIAGNOSIS — Z905 Acquired absence of kidney: Secondary | ICD-10-CM | POA: Insufficient documentation

## 2013-11-05 DIAGNOSIS — J449 Chronic obstructive pulmonary disease, unspecified: Secondary | ICD-10-CM | POA: Insufficient documentation

## 2013-11-05 DIAGNOSIS — C787 Secondary malignant neoplasm of liver and intrahepatic bile duct: Secondary | ICD-10-CM | POA: Insufficient documentation

## 2013-11-05 DIAGNOSIS — R51 Headache: Secondary | ICD-10-CM | POA: Insufficient documentation

## 2013-11-05 DIAGNOSIS — C642 Malignant neoplasm of left kidney, except renal pelvis: Secondary | ICD-10-CM

## 2013-11-05 DIAGNOSIS — K219 Gastro-esophageal reflux disease without esophagitis: Secondary | ICD-10-CM | POA: Insufficient documentation

## 2013-11-05 DIAGNOSIS — Z79899 Other long term (current) drug therapy: Secondary | ICD-10-CM | POA: Insufficient documentation

## 2013-11-05 DIAGNOSIS — K458 Other specified abdominal hernia without obstruction or gangrene: Secondary | ICD-10-CM | POA: Insufficient documentation

## 2013-11-05 DIAGNOSIS — F17200 Nicotine dependence, unspecified, uncomplicated: Secondary | ICD-10-CM | POA: Insufficient documentation

## 2013-11-05 LAB — CBC WITH DIFFERENTIAL/PLATELET
BASOS ABS: 0.1 10*3/uL (ref 0.0–0.1)
Basophils Relative: 0 % (ref 0–1)
Eosinophils Absolute: 0.3 10*3/uL (ref 0.0–0.7)
Eosinophils Relative: 2 % (ref 0–5)
HEMATOCRIT: 36.7 % (ref 36.0–46.0)
Hemoglobin: 11.9 g/dL — ABNORMAL LOW (ref 12.0–15.0)
Lymphocytes Relative: 24 % (ref 12–46)
Lymphs Abs: 3.6 10*3/uL (ref 0.7–4.0)
MCH: 26.7 pg (ref 26.0–34.0)
MCHC: 32.4 g/dL (ref 30.0–36.0)
MCV: 82.3 fL (ref 78.0–100.0)
MONO ABS: 1.2 10*3/uL — AB (ref 0.1–1.0)
Monocytes Relative: 8 % (ref 3–12)
NEUTROS ABS: 9.5 10*3/uL — AB (ref 1.7–7.7)
Neutrophils Relative %: 65 % (ref 43–77)
PLATELETS: 372 10*3/uL (ref 150–400)
RBC: 4.46 MIL/uL (ref 3.87–5.11)
RDW: 18 % — AB (ref 11.5–15.5)
WBC: 14.7 10*3/uL — AB (ref 4.0–10.5)

## 2013-11-05 LAB — APTT: APTT: 31 s (ref 24–37)

## 2013-11-05 LAB — PROTIME-INR
INR: 1.02 (ref 0.00–1.49)
PROTHROMBIN TIME: 13.2 s (ref 11.6–15.2)

## 2013-11-05 MED ORDER — LIDOCAINE HCL 1 % IJ SOLN
INTRAMUSCULAR | Status: DC
Start: 2013-11-05 — End: 2013-11-06
  Filled 2013-11-05: qty 20

## 2013-11-05 MED ORDER — SODIUM CHLORIDE 0.9 % IV SOLN
INTRAVENOUS | Status: DC
  Administered 2013-11-05: 13:00:00 via INTRAVENOUS

## 2013-11-05 MED ORDER — FENTANYL CITRATE 0.05 MG/ML IJ SOLN
INTRAMUSCULAR | Status: AC | PRN
  Administered 2013-11-05: 100 ug via INTRAVENOUS
  Administered 2013-11-05 (×2): 50 ug via INTRAVENOUS

## 2013-11-05 MED ORDER — FENTANYL CITRATE 0.05 MG/ML IJ SOLN
INTRAMUSCULAR | Status: AC
  Filled 2013-11-05: qty 4

## 2013-11-05 MED ORDER — HEPARIN SOD (PORK) LOCK FLUSH 100 UNIT/ML IV SOLN
INTRAVENOUS | Status: AC
  Filled 2013-11-05: qty 5

## 2013-11-05 MED ORDER — IOHEXOL 300 MG/ML  SOLN
5.0000 mL | Freq: Once | INTRAMUSCULAR | Status: AC | PRN
  Administered 2013-11-05: 1 mL via INTRAVENOUS

## 2013-11-05 MED ORDER — VANCOMYCIN HCL IN DEXTROSE 1-5 GM/200ML-% IV SOLN
1000.0000 mg | Freq: Once | INTRAVENOUS | Status: AC
  Administered 2013-11-05: 1000 mg via INTRAVENOUS
  Filled 2013-11-05: qty 200

## 2013-11-05 MED ORDER — MIDAZOLAM HCL 2 MG/2ML IJ SOLN
INTRAMUSCULAR | Status: AC | PRN
  Administered 2013-11-05: 1 mg via INTRAVENOUS
  Administered 2013-11-05: 2 mg via INTRAVENOUS
  Administered 2013-11-05: 1 mg via INTRAVENOUS

## 2013-11-05 MED ORDER — LIDOCAINE-EPINEPHRINE (PF) 2 %-1:200000 IJ SOLN
INTRAMUSCULAR | Status: AC
  Filled 2013-11-05: qty 20

## 2013-11-05 MED ORDER — MIDAZOLAM HCL 2 MG/2ML IJ SOLN
INTRAMUSCULAR | Status: AC
  Filled 2013-11-05: qty 4

## 2013-11-05 MED ORDER — HEPARIN SOD (PORK) LOCK FLUSH 100 UNIT/ML IV SOLN
500.0000 [IU] | Freq: Once | INTRAVENOUS | Status: AC
  Administered 2013-11-05: 500 [IU] via INTRAVENOUS

## 2013-11-05 NOTE — Telephone Encounter (Signed)
Gave pt's sister appt for chemo class tomorrow

## 2013-11-05 NOTE — Discharge Instructions (Signed)
Implanted Port Home Guide °An implanted port is a type of central line that is placed under the skin. Central lines are used to provide IV access when treatment or nutrition needs to be given through a person's veins. Implanted ports are used for long-term IV access. An implanted port may be placed because:  °· You need IV medicine that would be irritating to the small veins in your hands or arms.   °· You need long-term IV medicines, such as antibiotics.   °· You need IV nutrition for a long period.   °· You need frequent blood draws for lab tests.   °· You need dialysis.   °Implanted ports are usually placed in the chest area, but they can also be placed in the upper arm, the abdomen, or the leg. An implanted port has two main parts:  °· Reservoir. The reservoir is round and will appear as a small, raised area under your skin. The reservoir is the part where a needle is inserted to give medicines or draw blood.   °· Catheter. The catheter is a thin, flexible tube that extends from the reservoir. The catheter is placed into a large vein. Medicine that is inserted into the reservoir goes into the catheter and then into the vein.   °HOW WILL I CARE FOR MY INCISION SITE? °Do not get the incision site wet. Bathe or shower as directed by your health care provider.  °HOW IS MY PORT ACCESSED? °Special steps must be taken to access the port:  °· Before the port is accessed, a numbing cream can be placed on the skin. This helps numb the skin over the port site.   °· Your health care provider uses a sterile technique to access the port. °· Your health care provider must put on a mask and sterile gloves. °· The skin over your port is cleaned carefully with an antiseptic and allowed to dry. °· The port is gently pinched between sterile gloves, and a needle is inserted into the port. °· Only "non-coring" port needles should be used to access the port. Once the port is accessed, a blood return should be checked. This helps  ensure that the port is in the vein and is not clogged.   °· If your port needs to remain accessed for a constant infusion, a clear (transparent) bandage will be placed over the needle site. The bandage and needle will need to be changed every week, or as directed by your health care provider.   °· Keep the bandage covering the needle clean and dry. Do not get it wet. Follow your health care provider's instructions on how to take a shower or bath while the port is accessed.   °· If your port does not need to stay accessed, no bandage is needed over the port.   °WHAT IS FLUSHING? °Flushing helps keep the port from getting clogged. Follow your health care provider's instructions on how and when to flush the port. Ports are usually flushed with saline solution or a medicine called heparin. The need for flushing will depend on how the port is used.  °· If the port is used for intermittent medicines or blood draws, the port will need to be flushed:   °· After medicines have been given.   °· After blood has been drawn.   °· As part of routine maintenance.   °· If a constant infusion is running, the port may not need to be flushed.   °HOW LONG WILL MY PORT STAY IMPLANTED? °The port can stay in for as long as your health care   provider thinks it is needed. When it is time for the port to come out, surgery will be done to remove it. The procedure is similar to the one performed when the port was put in.  °WHEN SHOULD I SEEK IMMEDIATE MEDICAL CARE? °When you have an implanted port, you should seek immediate medical care if:  °· You notice a bad smell coming from the incision site.   °· You have swelling, redness, or drainage at the incision site.   °· You have more swelling or pain at the port site or the surrounding area.   °· You have a fever that is not controlled with medicine. °Document Released: 10/09/2005 Document Revised: 07/30/2013 Document Reviewed: 06/16/2013 °Exit[employer]® Patient Information ©2014 [employer]. °Moderate Sedation, Adult °Moderate sedation is given to help you relax or even sleep through a procedure. You may remain sleepy, be clumsy, or have poor balance for several hours following this procedure. Arrange for a responsible adult, family member, or friend to take you home. A responsible adult should stay with you for at least 24 hours or until the medicines have worn off. °· Do not participate in any activities where you could become injured for the next 24 hours, or until you feel normal again. Do not: °· Drive. °· Swim. °· Ride a bicycle. °· Operate heavy machinery. °· Cook. °· Use power tools. °· Climb ladders. °· Work at heights. °· Do not make important decisions or sign legal documents until you are improved. °· Vomiting may occur if you eat too soon. When you can drink without vomiting, try water, juice, or soup. Try solid foods if you feel little or no nausea. °· Only take over-the-counter or prescription medications for pain, discomfort, or fever as directed by your caregiver.If pain medications have been prescribed for you, ask your caregiver how soon it is safe to take them. °· Make sure you and your family fully understands everything about the medication given to you. Make sure you understand what side effects may occur. °· You should not drink alcohol, take sleeping pills, or medications that cause drowsiness for at least 24 hours. °· If you smoke, do not smoke alone. °· If you are feeling better, you may resume normal activities 24 hours after receiving sedation. °· Keep all appointments as scheduled. Follow all instructions. °· Ask questions if you do not understand. °SEEK MEDICAL CARE IF:  °· Your skin is pale or bluish in color. °· You continue to feel sick to your stomach (nauseous) or throw up (vomit). °· Your pain is getting worse and not helped by medication. °· You have bleeding or swelling. °· You are still sleepy or feeling clumsy after 24 hours. °SEEK IMMEDIATE MEDICAL CARE IF:   °· You develop a rash. °· You have difficulty breathing. °· You develop any type of allergic problem. °· You have a fever. °Document Released: 07/04/2001 Document Revised: 01/01/2012 Document Reviewed: 06/16/2013 °[employer]® Patient Information ©2014 [employer]. ° °

## 2013-11-05 NOTE — Procedures (Signed)
Placement of right chest portacath.  Tip in lower SVC and ready to use.  No immediate complication.

## 2013-11-05 NOTE — H&P (Signed)
Kaylee [name] is an 73 y.o. female.   Chief Complaint: "I'm here for a port a cath" HPI: Patient with history of stage 4 metastatic renal cell carcinoma presents today for port a cath placement for chemotherapy.  Past Medical History  Diagnosis Date  . Depression   . Back pain   . Lung cancer     s/p resection  . COPD (chronic obstructive pulmonary disease)   . Headache(784.0)     HX MIGRAINES   . GERD (gastroesophageal reflux disease)   . Renal cell cancer Left  . Frequency of urination   . Urgency of urination   . Nocturia   . Hernia     L LOWER ABD  . Difficulty sleeping   . Headache 10/21/2013  . Adrenal insufficiency     Past Surgical History  Procedure Laterality Date  . Lung surgery  1985    RT UPPER LOBE REMOVED FOR CANCER - NOT [place]  . Breast surgery  1988    AUGMENTATION  . Back surgery  1985  . Abdominal hysterectomy  1970  . Hand surgery      LEFT  . Robot assisted laparoscopic nephrectomy Left 09/15/2013    Procedure: ROBOTIC ASSISTED LAPAROSCOPIC NEPHRECTOMY;  Surgeon: Alexis Frock, MD;  Location: [facility];  Service: Urology;  Laterality: Left;    Family History  Problem Relation Age of Onset  . Cancer Mother     Melanoma  . Heart disease Father   . Stroke Father   . Cancer Maternal Grandmother     Colon cancer  . Cancer Brother     Colon cancer  . Cancer Sister     Adrenal gland cancer   Social History:  reports that she has been smoking Cigarettes.  She has a 27 pack-year smoking history. She has never used smokeless tobacco. She reports that she does not drink alcohol or use illicit drugs.  Allergies:  Allergies  Allergen Reactions  . Penicillins     As a child - swelled up  . Codeine Swelling and Rash    Current outpatient prescriptions:ALPRAZolam (XANAX) 0.25 MG tablet, Take 1 tablet (0.25 mg total) by mouth 2 (two) times daily as needed for anxiety or sleep., Disp: 30 tablet, Rfl: 0;  omeprazole (PRILOSEC) 40 MG capsule,  Take 40 mg by mouth daily., Disp: , Rfl: ;  Oxycodone HCl 10 MG TABS, Take 10 mg by mouth every 3 (three) hours as needed (pain)., Disp: , Rfl: ;  PARoxetine (PAXIL) 20 MG tablet, Take 20 mg by mouth every evening., Disp: , Rfl:  polyethylene glycol (MIRALAX / GLYCOLAX) packet, Take 17 g by mouth daily., Disp: , Rfl: ;  predniSONE (DELTASONE) 10 MG tablet, Take 10 mg by mouth 2 (two) times daily with a meal., Disp: , Rfl: ;  butalbital-acetaminophen-caffeine (FIORICET, ESGIC) 50-325-40 MG per tablet, Take 1 tablet by mouth every 6 (six) hours as needed for headache., Disp: , Rfl:  lidocaine-prilocaine (EMLA) cream, Apply 1 application topically as needed (port)., Disp: , Rfl: ;  promethazine (PHENERGAN) 25 MG tablet, Take 25 mg by mouth every 6 (six) hours as needed for nausea., Disp: , Rfl:  Current facility-administered medications:0.9 %  sodium chloride infusion, , Intravenous, Continuous, Ascencion Dike, PA-C, Last Rate: 50 mL/hr at 11/05/13 1304;  vancomycin (VANCOCIN) IVPB 1000 mg/200 mL premix, 1,000 mg, Intravenous, Once, Ascencion Dike, PA-C   Results for orders placed during the hospital encounter of 11/05/13 (from the past 48 hour(s))  APTT  Status: None   Collection Time    11/05/13 12:45 PM      Result Value Range   aPTT 31  24 - 37 seconds  CBC WITH DIFFERENTIAL     Status: Abnormal   Collection Time    11/05/13 12:45 PM      Result Value Range   WBC 14.7 (*) 4.0 - 10.5 K/uL   RBC 4.46  3.87 - 5.11 MIL/uL   Hemoglobin 11.9 (*) 12.0 - 15.0 g/dL   HCT 36.7  36.0 - 46.0 %   MCV 82.3  78.0 - 100.0 fL   MCH 26.7  26.0 - 34.0 pg   MCHC 32.4  30.0 - 36.0 g/dL   RDW 18.0 (*) 11.5 - 15.5 %   Platelets 372  150 - 400 K/uL   Neutrophils Relative % 65  43 - 77 %   Neutro Abs 9.5 (*) 1.7 - 7.7 K/uL   Lymphocytes Relative 24  12 - 46 %   Lymphs Abs 3.6  0.7 - 4.0 K/uL   Monocytes Relative 8  3 - 12 %   Monocytes Absolute 1.2 (*) 0.1 - 1.0 K/uL   Eosinophils Relative 2  0 - 5 %    Eosinophils Absolute 0.3  0.0 - 0.7 K/uL   Basophils Relative 0  0 - 1 %   Basophils Absolute 0.1  0.0 - 0.1 K/uL  PROTIME-INR     Status: None   Collection Time    11/05/13 12:45 PM      Result Value Range   Prothrombin Time 13.2  11.6 - 15.2 seconds   INR 1.02  0.00 - 1.49   No results found.  Review of Systems  Constitutional: Negative for fever and chills.  Respiratory: Negative for hemoptysis and shortness of breath.        Occ cough  Cardiovascular:       Occ substernal CP with coughing; not persistent  Gastrointestinal: Negative for nausea and vomiting.       Occ lower abd pain  Musculoskeletal:       Occ back pain  Neurological: Positive for headaches.   Vitals: BP 152/68  HR 71  R 20  TEMP 97.8  O2 SATS 96% RA Physical Exam  Constitutional: She is oriented to person, place, and time.  Thin elderly WF in NAD  Cardiovascular: Normal rate and regular rhythm.   Respiratory: Effort normal and breath sounds normal.  GI: Soft. Bowel sounds are normal.  Hx left lower abd hernia; mildly tender to palpation  Musculoskeletal: Normal range of motion. She exhibits no edema.  Neurological: She is alert and oriented to person, place, and time.     Assessment/Plan Patient with history of stage 4 metastatic renal cell carcinoma presents today for port a cath placement for chemotherapy. Details/risks of procedure d/w pt /sister with their understanding and consent.   Izaia Say,D KEVIN 11/05/2013, 1:34 PM

## 2013-11-06 ENCOUNTER — Other Ambulatory Visit: Payer: Self-pay | Admitting: Hematology and Oncology

## 2013-11-06 ENCOUNTER — Telehealth: Payer: Self-pay | Admitting: *Deleted

## 2013-11-06 ENCOUNTER — Encounter: Payer: Self-pay | Admitting: *Deleted

## 2013-11-06 ENCOUNTER — Other Ambulatory Visit: Payer: Medicare HMO

## 2013-11-06 DIAGNOSIS — C649 Malignant neoplasm of unspecified kidney, except renal pelvis: Secondary | ICD-10-CM

## 2013-11-06 NOTE — Telephone Encounter (Signed)
I called and moved appts for tomorrow down by half an hour

## 2013-11-07 ENCOUNTER — Other Ambulatory Visit: Payer: Self-pay | Admitting: Hematology and Oncology

## 2013-11-07 ENCOUNTER — Ambulatory Visit (HOSPITAL_BASED_OUTPATIENT_CLINIC_OR_DEPARTMENT_OTHER): Payer: Medicare HMO

## 2013-11-07 VITALS — BP 107/60 | HR 73 | Temp 98.1°F | Resp 18

## 2013-11-07 DIAGNOSIS — Z5111 Encounter for antineoplastic chemotherapy: Secondary | ICD-10-CM

## 2013-11-07 DIAGNOSIS — C7800 Secondary malignant neoplasm of unspecified lung: Secondary | ICD-10-CM

## 2013-11-07 DIAGNOSIS — C649 Malignant neoplasm of unspecified kidney, except renal pelvis: Secondary | ICD-10-CM

## 2013-11-07 DIAGNOSIS — C787 Secondary malignant neoplasm of liver and intrahepatic bile duct: Secondary | ICD-10-CM

## 2013-11-07 LAB — COMPREHENSIVE METABOLIC PANEL (CC13)
ALK PHOS: 116 U/L (ref 40–150)
ALT: 16 U/L (ref 0–55)
AST: 17 U/L (ref 5–34)
Albumin: 3.4 g/dL — ABNORMAL LOW (ref 3.5–5.0)
Anion Gap: 10 mEq/L (ref 3–11)
BUN: 22.5 mg/dL (ref 7.0–26.0)
CO2: 23 mEq/L (ref 22–29)
Calcium: 9.5 mg/dL (ref 8.4–10.4)
Chloride: 102 mEq/L (ref 98–109)
Creatinine: 1.1 mg/dL (ref 0.6–1.1)
Glucose: 165 mg/dl — ABNORMAL HIGH (ref 70–140)
POTASSIUM: 4.9 meq/L (ref 3.5–5.1)
SODIUM: 135 meq/L — AB (ref 136–145)
TOTAL PROTEIN: 7.4 g/dL (ref 6.4–8.3)
Total Bilirubin: 0.33 mg/dL (ref 0.20–1.20)

## 2013-11-07 LAB — CBC WITH DIFFERENTIAL/PLATELET
BASO%: 0.2 % (ref 0.0–2.0)
Basophils Absolute: 0 10*3/uL (ref 0.0–0.1)
EOS ABS: 0.5 10*3/uL (ref 0.0–0.5)
EOS%: 3.2 % (ref 0.0–7.0)
HCT: 34.7 % — ABNORMAL LOW (ref 34.8–46.6)
HGB: 10.9 g/dL — ABNORMAL LOW (ref 11.6–15.9)
LYMPH%: 20.8 % (ref 14.0–49.7)
MCH: 26.1 pg (ref 25.1–34.0)
MCHC: 31.4 g/dL — AB (ref 31.5–36.0)
MCV: 83.2 fL (ref 79.5–101.0)
MONO#: 1.3 10*3/uL — ABNORMAL HIGH (ref 0.1–0.9)
MONO%: 9.3 % (ref 0.0–14.0)
NEUT#: 9.4 10*3/uL — ABNORMAL HIGH (ref 1.5–6.5)
NEUT%: 66.5 % (ref 38.4–76.8)
PLATELETS: 302 10*3/uL (ref 145–400)
RBC: 4.17 10*6/uL (ref 3.70–5.45)
RDW: 17.6 % — ABNORMAL HIGH (ref 11.2–14.5)
WBC: 14.2 10*3/uL — ABNORMAL HIGH (ref 3.9–10.3)
lymph#: 3 10*3/uL (ref 0.9–3.3)

## 2013-11-07 MED ORDER — ONDANSETRON 8 MG/NS 50 ML IVPB
INTRAVENOUS | Status: AC
  Filled 2013-11-07: qty 8

## 2013-11-07 MED ORDER — DIPHENHYDRAMINE HCL 50 MG/ML IJ SOLN
50.0000 mg | Freq: Once | INTRAMUSCULAR | Status: AC
  Administered 2013-11-07: 50 mg via INTRAVENOUS

## 2013-11-07 MED ORDER — DIPHENHYDRAMINE HCL 50 MG/ML IJ SOLN
INTRAMUSCULAR | Status: AC
  Filled 2013-11-07: qty 1

## 2013-11-07 MED ORDER — ONDANSETRON 8 MG/50ML IVPB (CHCC)
8.0000 mg | Freq: Once | INTRAVENOUS | Status: AC
  Administered 2013-11-07: 8 mg via INTRAVENOUS

## 2013-11-07 MED ORDER — SODIUM CHLORIDE 0.9 % IV SOLN
Freq: Once | INTRAVENOUS | Status: AC
  Administered 2013-11-07: 09:00:00 via INTRAVENOUS

## 2013-11-07 MED ORDER — TEMSIROLIMUS CHEMO INJECTION 25 MG/ML W/DILUENT
25.0000 mg | Freq: Once | INTRAVENOUS | Status: AC
  Administered 2013-11-07: 25 mg via INTRAVENOUS
  Filled 2013-11-07: qty 2.5

## 2013-11-07 MED ORDER — HEPARIN SOD (PORK) LOCK FLUSH 100 UNIT/ML IV SOLN
500.0000 [IU] | Freq: Once | INTRAVENOUS | Status: AC | PRN
  Administered 2013-11-07: 500 [IU]
  Filled 2013-11-07: qty 5

## 2013-11-07 MED ORDER — SODIUM CHLORIDE 0.9 % IJ SOLN
10.0000 mL | INTRAMUSCULAR | Status: DC | PRN
  Administered 2013-11-07: 10 mL
  Filled 2013-11-07: qty 10

## 2013-11-07 NOTE — Patient Instructions (Signed)
[place] Discharge Instructions for Patients Receiving Chemotherapy  Today you received the following chemotherapy agents: Torisel  To help prevent nausea and vomiting after your treatment, we encourage you to take your nausea medication as prescribed.    If you develop nausea and vomiting that is not controlled by your nausea medication, call the clinic.   BELOW ARE SYMPTOMS THAT SHOULD BE REPORTED IMMEDIATELY:  *FEVER GREATER THAN 100.5 F  *CHILLS WITH OR WITHOUT FEVER  NAUSEA AND VOMITING THAT IS NOT CONTROLLED WITH YOUR NAUSEA MEDICATION  *UNUSUAL SHORTNESS OF BREATH  *UNUSUAL BRUISING OR BLEEDING  TENDERNESS IN MOUTH AND THROAT WITH OR WITHOUT PRESENCE OF ULCERS  *URINARY PROBLEMS  *BOWEL PROBLEMS  UNUSUAL RASH Items with * indicate a potential emergency and should be followed up as soon as possible.  Feel free to call the clinic you have any questions or concerns. The clinic phone number is [phone].   Temsirolimus injection - Torisel What is this medicine? TEMSIROLIMUS (TEM sir OH li mus) is a drug that alters immune system response in the body. It is used to treat renal cell cancer. This medicine may be used for other purposes; ask your health care provider or pharmacist if you have questions. COMMON BRAND NAME(S): Torisel What should I tell my health care provider before I take this medicine? They need to know if you have any of these conditions: -diabetes -heart disease -high cholesterol -immune system problems -infection (especially a virus infection such as chickenpox, cold sores, or herpes) -liver disease -low blood counts, like low white cell, platelet, or red cell counts -lung or breathing disease, like asthma -take medicines that treat or prevent blood clots -an unusual or allergic reaction to temsirolimus, polysorbate 80, other medicines, foods, dyes, or preservatives -pregnant or trying to get pregnant -breast-feeding How  should I use this medicine? This medicine is for infusion into a vein. It is given by a health care professional in a hospital or clinic setting. Talk to your pediatrician regarding the use of this medicine in children. Special care may be needed. Overdosage: If you think you have taken too much of this medicine contact a poison control center or emergency room at once. NOTE: This medicine is only for you. Do not share this medicine with others. What if I miss a dose? It is important not to miss your dose. Call your doctor or health care professional if you are unable to keep an appointment. What may interact with this medicine? Do not take this medicine with any of the following medications: -grapefruit juice -St. John's Wort This medicine may also interact with the following medications: -carbamazepine -dexamethasone -nefazodone -phenobarbital -phenytoin -medicines for heart or kidney problems like captopril, lisinopril -medicines for infection like clarithromycin, itraconazole, ketoconazole, rifabutin, rifampin, telithromycin -some medicines for HIV like atazanavir, indinavir, nelfinavir, ritonavir, saquinavir -sunitinib -vaccines This list may not describe all possible interactions. Give your health care provider a list of all the medicines, herbs, non-prescription drugs, or dietary supplements you use. Also tell them if you smoke, drink alcohol, or use illegal drugs. Some items may interact with your medicine. What should I watch for while using this medicine? Visit your doctor for regular check-ups. you will need important blood work done while you are taking this medicine. This drug may make you feel generally unwell. This is not uncommon, as chemotherapy can affect healthy cells as well as cancer cells. Report any side effects. Continue your course of treatment even though you feel  ill unless your doctor tells you to stop. Call your doctor or health care professional for advice if  you get a fever, chills or sore throat, or other symptoms of a cold or flu. Do not treat yourself. This drug decreases your body's ability to fight infections. Try to avoid being around people who are sick. This medicine may increase your risk to bruise or bleed. Call your doctor or health care professional if you notice any unusual bleeding. Be careful brushing and flossing your teeth or using a toothpick because you may get an infection or bleed more easily. If you have any dental work done, tell your dentist you are receiving this medicine. Avoid taking products that contain aspirin, acetaminophen, ibuprofen, naproxen, or ketoprofen unless instructed by your doctor. These medicines may hide a fever. Do not become pregnant while taking this medicine. Women should inform their doctor if they wish to become pregnant or think they might be pregnant. There is a potential for serious side effects to an unborn child. Women who are able to have children should use effective birth control before, during, and for 12 weeks after stopping this medicine. Talk to your health care professional or pharmacist for more information. Do not breast-feed an infant while taking this medicine. What side effects may I notice from receiving this medicine? Side effects that you should report to your doctor or health care professional as soon as possible: -allergic reactions like skin rash, itching or hives, swelling of the face, lips, or tongue -breathing problems, cough -chest pain -dizziness -fever or chills, sore throat -hallucination, loss of contact with reality -increased hunger or thirst -increased urination -pain, swelling, warmth in the leg -redness, blistering, peeling or loosening of the skin, including inside the mouth -seizures -swelling of the legs or ankles -trouble passing urine or change in the amount of urine -unusual bleeding or bruising -unusually weak or tired Side effects that usually do not  require medical attention (report to your prescriber or health care professional if they continue or are bothersome): -constipation -loss of appetite -mouth sores -nausea, vomiting -stomach pain This list may not describe all possible side effects. Call your doctor for medical advice about side effects. You may report side effects to FDA at 1-800-FDA-1088. Where should I keep my medicine? This drug is given in a hospital or clinic and will not be stored at home. NOTE: This sheet is a summary. It may not cover all possible information. If you have questions about this medicine, talk to your doctor, pharmacist, or health care provider.  2014, Elsevier/Gold Standard. (2008-07-06 16:31:28)

## 2013-11-07 NOTE — Progress Notes (Signed)
Patient completed first time Torisel without any complications. Triage follow up call placed and education regarding when to call the doctor and how to manage nausea at home done. Patient discharged home. Cindi Carbon, RN

## 2013-11-10 ENCOUNTER — Telehealth: Payer: Self-pay | Admitting: *Deleted

## 2013-11-10 NOTE — Telephone Encounter (Signed)
Spoke with patient, denies nausea, vomiting, diarrhea, eating and drinking, no problems. Encouraged to call if any problems. Patient received torisel.

## 2013-11-11 ENCOUNTER — Encounter (INDEPENDENT_AMBULATORY_CARE_PROVIDER_SITE_OTHER): Payer: Self-pay | Admitting: Surgery

## 2013-11-13 ENCOUNTER — Other Ambulatory Visit (HOSPITAL_BASED_OUTPATIENT_CLINIC_OR_DEPARTMENT_OTHER): Payer: Medicare HMO

## 2013-11-13 ENCOUNTER — Encounter: Payer: Self-pay | Admitting: Hematology and Oncology

## 2013-11-13 ENCOUNTER — Ambulatory Visit (HOSPITAL_BASED_OUTPATIENT_CLINIC_OR_DEPARTMENT_OTHER): Payer: Medicare HMO | Admitting: Hematology and Oncology

## 2013-11-13 VITALS — BP 110/61 | HR 93 | Temp 97.7°F | Resp 19 | Ht 67.0 in | Wt 121.1 lb

## 2013-11-13 DIAGNOSIS — R059 Cough, unspecified: Secondary | ICD-10-CM

## 2013-11-13 DIAGNOSIS — E2749 Other adrenocortical insufficiency: Secondary | ICD-10-CM

## 2013-11-13 DIAGNOSIS — F17200 Nicotine dependence, unspecified, uncomplicated: Secondary | ICD-10-CM

## 2013-11-13 DIAGNOSIS — D649 Anemia, unspecified: Secondary | ICD-10-CM

## 2013-11-13 DIAGNOSIS — C649 Malignant neoplasm of unspecified kidney, except renal pelvis: Secondary | ICD-10-CM

## 2013-11-13 DIAGNOSIS — R05 Cough: Secondary | ICD-10-CM

## 2013-11-13 DIAGNOSIS — R109 Unspecified abdominal pain: Secondary | ICD-10-CM

## 2013-11-13 LAB — COMPREHENSIVE METABOLIC PANEL (CC13)
ALBUMIN: 3.3 g/dL — AB (ref 3.5–5.0)
ALK PHOS: 107 U/L (ref 40–150)
ALT: 10 U/L (ref 0–55)
AST: 12 U/L (ref 5–34)
Anion Gap: 10 mEq/L (ref 3–11)
BUN: 18.5 mg/dL (ref 7.0–26.0)
CALCIUM: 9.7 mg/dL (ref 8.4–10.4)
CO2: 23 mEq/L (ref 22–29)
CREATININE: 0.9 mg/dL (ref 0.6–1.1)
Chloride: 105 mEq/L (ref 98–109)
GLUCOSE: 139 mg/dL (ref 70–140)
Potassium: 4 mEq/L (ref 3.5–5.1)
Sodium: 138 mEq/L (ref 136–145)
Total Bilirubin: 0.2 mg/dL (ref 0.20–1.20)
Total Protein: 7.4 g/dL (ref 6.4–8.3)

## 2013-11-13 LAB — CBC WITH DIFFERENTIAL/PLATELET
BASO%: 1.4 % (ref 0.0–2.0)
Basophils Absolute: 0.1 10*3/uL (ref 0.0–0.1)
EOS%: 4.8 % (ref 0.0–7.0)
Eosinophils Absolute: 0.5 10*3/uL (ref 0.0–0.5)
HCT: 33.2 % — ABNORMAL LOW (ref 34.8–46.6)
HEMOGLOBIN: 11 g/dL — AB (ref 11.6–15.9)
LYMPH%: 29.8 % (ref 14.0–49.7)
MCH: 26.8 pg (ref 25.1–34.0)
MCHC: 33.2 g/dL (ref 31.5–36.0)
MCV: 80.7 fL (ref 79.5–101.0)
MONO#: 0.8 10*3/uL (ref 0.1–0.9)
MONO%: 7.8 % (ref 0.0–14.0)
NEUT#: 5.5 10*3/uL (ref 1.5–6.5)
NEUT%: 56.2 % (ref 38.4–76.8)
PLATELETS: 225 10*3/uL (ref 145–400)
RBC: 4.11 10*6/uL (ref 3.70–5.45)
RDW: 17.6 % — AB (ref 11.2–14.5)
WBC: 9.8 10*3/uL (ref 3.9–10.3)
lymph#: 2.9 10*3/uL (ref 0.9–3.3)

## 2013-11-13 NOTE — Progress Notes (Signed)
[place] OFFICE PROGRESS NOTE  Patient Care Team: [name], MD as PCP - General (Internal Medicine) Heath Lark, MD as Consulting Physician (Hematology and Oncology)  DIAGNOSIS: Metastatic kidney cancer  SUMMARY OF ONCOLOGIC HISTORY: Oncology History   Renal cell carcinoma   Primary site: Kidney (Left)   Staging method: AJCC 7th Edition   Clinical: Stage IV (T3a, N0, M1) signed by Heath Lark, MD on 10/21/2013  8:46 AM   Pathologic: Stage IV (T3a, N0, M1) signed by Heath Lark, MD on 10/21/2013  8:47 AM   Summary: Stage IV (T3a, N0, M1)       Renal cell carcinoma   10/30/2010 Imaging Negative CT abdomen   09/15/2013 Surgery Patient underwent left nephrectomy which confirmed RCC, Fuhrman grade III/IV   09/27/2013 Imaging CT abdomen showed liver metastasis   09/29/2013 Procedure CT guided liver biopsy confrimed metastatic disease   09/29/2013 Imaging CT chest showed enlarging pulmonary metastases   09/30/2013 - 10/10/2013 Hospital Admission Admitted for orthostatic hypotension and headaches and severe malnutrition   10/21/2013 - 10/23/2013 Hospital Admission She was admitted to the hospital with recurrent urinary tract infection, dehydration, severe headaches and adrenal insufficiency.    INTERVAL HISTORY: Kaylee [name] 73 y.o. female returns for further followup. She complained of fatigue and stay in bed a lot. That was related also with a recent sore throat. She denies recent fever. She has been coughing a lot lately and developed severe pain in the right abdominal wall region. She is attempting to quit smoking.  I have reviewed the past medical history, past surgical history, social history and family history with the patient and they are unchanged from previous note.  ALLERGIES:  is allergic to penicillins and codeine.  MEDICATIONS:  Current Outpatient Prescriptions  Medication Sig Dispense Refill  . ALPRAZolam (XANAX) 0.25 MG tablet Take 1 tablet (0.25  mg total) by mouth 2 (two) times daily as needed for anxiety or sleep.  30 tablet  0  . butalbital-acetaminophen-caffeine (FIORICET, ESGIC) 50-325-40 MG per tablet Take 1 tablet by mouth every 6 (six) hours as needed for headache.      . lidocaine-prilocaine (EMLA) cream Apply 1 application topically as needed (port).      Marland Kitchen omeprazole (PRILOSEC) 40 MG capsule Take 40 mg by mouth daily.      . Oxycodone HCl 10 MG TABS Take 10 mg by mouth every 3 (three) hours as needed (pain).      Marland Kitchen PARoxetine (PAXIL) 20 MG tablet Take 20 mg by mouth every evening.      . polyethylene glycol (MIRALAX / GLYCOLAX) packet Take 17 g by mouth daily.      . predniSONE (DELTASONE) 10 MG tablet Take 10 mg by mouth 2 (two) times daily with a meal.      . promethazine (PHENERGAN) 25 MG tablet Take 25 mg by mouth every 6 (six) hours as needed for nausea.       No current facility-administered medications for this visit.    REVIEW OF SYSTEMS:   Constitutional: Denies fevers, chills or abnormal weight loss Eyes: Denies blurriness of vision Ears, nose, mouth, throat, and face: Denies mucositis or sore throat Cardiovascular: Denies palpitation, chest discomfort or lower extremity swelling Gastrointestinal:  Denies nausea, heartburn or change in bowel habits Skin: Denies abnormal skin rashes Lymphatics: Denies new lymphadenopathy or easy bruising Neurological:Denies numbness, tingling or new weaknesses Behavioral/Psych: Mood is stable, no new changes  All other systems were reviewed with the  patient and are negative.  PHYSICAL EXAMINATION: ECOG PERFORMANCE STATUS: 1 - Symptomatic but completely ambulatory  Filed Vitals:   11/13/13 0832  BP: 110/61  Pulse: 93  Temp: 97.7 F (36.5 C)  Resp: 19   Filed Weights   11/13/13 0832  Weight: 121 lb 1.6 oz (54.931 kg)    GENERAL:alert, no distress and comfortable. She looks thin and cachectic SKIN: skin color, texture, turgor are normal, no rashes or significant  lesions EYES: normal, Conjunctiva are pink and non-injected, sclera clear OROPHARYNX:no exudate, no erythema and lips, buccal mucosa, and tongue normal  NECK: supple, thyroid normal size, non-tender, without nodularity LYMPH:  no palpable lymphadenopathy in the cervical, axillary or inguinal LUNGS: clear to auscultation and percussion with normal breathing effort HEART: regular rate & rhythm and no murmurs and no lower extremity edema ABDOMEN:abdomen soft, significant tenderness in her abdominal wall consistent with possible hernia Musculoskeletal:no cyanosis of digits and no clubbing  NEURO: alert & oriented x 3 with fluent speech, no focal motor/sensory deficits  LABORATORY DATA:  I have reviewed the data as listed    Component Value Date/Time   NA 138 11/13/2013 0825   NA 135* 10/23/2013 0409   K 4.0 11/13/2013 0825   K 4.6 10/23/2013 0409   CL 99 10/23/2013 0409   CO2 23 11/13/2013 0825   CO2 26 10/23/2013 0409   GLUCOSE 139 11/13/2013 0825   GLUCOSE 215* 10/23/2013 0409   BUN 18.5 11/13/2013 0825   BUN 16 10/23/2013 0409   CREATININE 0.9 11/13/2013 0825   CREATININE 0.74 10/23/2013 0409   CALCIUM 9.7 11/13/2013 0825   CALCIUM 9.0 10/23/2013 0409   PROT 7.4 11/13/2013 0825   PROT 7.0 10/15/2013 1316   ALBUMIN 3.3* 11/13/2013 0825   ALBUMIN 3.5 10/15/2013 1316   AST 12 11/13/2013 0825   AST 12 10/15/2013 1316   ALT 10 11/13/2013 0825   ALT 10 10/15/2013 1316   ALKPHOS 107 11/13/2013 0825   ALKPHOS 81 10/15/2013 1316   BILITOT 0.20 11/13/2013 0825   BILITOT 0.6 10/15/2013 1316   GFRNONAA 82* 10/23/2013 0409   GFRAA >90 10/23/2013 0409    No results found for this basename: SPEP,  UPEP,   kappa and lambda light chains    Lab Results  Component Value Date   WBC 9.8 11/13/2013   NEUTROABS 5.5 11/13/2013   HGB 11.0* 11/13/2013   HCT 33.2* 11/13/2013   MCV 80.7 11/13/2013   PLT 225 11/13/2013      Chemistry      Component Value Date/Time   NA 138 11/13/2013 0825   NA 135* 10/23/2013 0409   K 4.0  11/13/2013 0825   K 4.6 10/23/2013 0409   CL 99 10/23/2013 0409   CO2 23 11/13/2013 0825   CO2 26 10/23/2013 0409   BUN 18.5 11/13/2013 0825   BUN 16 10/23/2013 0409   CREATININE 0.9 11/13/2013 0825   CREATININE 0.74 10/23/2013 0409      Component Value Date/Time   CALCIUM 9.7 11/13/2013 0825   CALCIUM 9.0 10/23/2013 0409   ALKPHOS 107 11/13/2013 0825   ALKPHOS 81 10/15/2013 1316   AST 12 11/13/2013 0825   AST 12 10/15/2013 1316   ALT 10 11/13/2013 0825   ALT 10 10/15/2013 1316   BILITOT 0.20 11/13/2013 0825   BILITOT 0.6 10/15/2013 1316     ASSESSMENT & PLAN:  #1 stage IV metastatic kidney cancer Her performance status has improved since recent admission.  she tolerated chemotherapy  well. I will see her back in 2 weeks #2 tobacco abuse I spent some time educating the patient importance of nicotine cessation #3 adrenal insufficiency Currently she is taking prednisone 5 mg twice daily. I recommend she reduce her prednisone to take it only twice a day every other day #4 weight loss This is due to untreated cancer. I recommend she increase oral intake. She is currently on prednisone #5 anemia This is likely anemia of chronic disease. The patient denies recent history of bleeding such as epistaxis, hematuria or hematochezia. She is asymptomatic from the anemia. We will observe for now.  She does not require transfusion now.  #6 abdominal pain This is due to possible hernia from coughing. I recommend observation for now #7 cough This could be due to COPD. She has no evidence of leukocytosis. It could also be viral in nature. We will observe for now.  All questions were answered. The patient knows to call the clinic with any problems, questions or concerns. No barriers to learning was detected. I spent 25 minutes counseling the patient face to face. The total time spent in the appointment was 40 minutes and more than 50% was on counseling and review of test results     [facility], [place], MD 11/13/2013  11:04 PM

## 2013-11-14 ENCOUNTER — Ambulatory Visit (HOSPITAL_BASED_OUTPATIENT_CLINIC_OR_DEPARTMENT_OTHER): Payer: Medicare HMO

## 2013-11-14 VITALS — BP 120/64 | HR 88 | Temp 98.0°F | Resp 17

## 2013-11-14 DIAGNOSIS — C649 Malignant neoplasm of unspecified kidney, except renal pelvis: Secondary | ICD-10-CM

## 2013-11-14 DIAGNOSIS — C7800 Secondary malignant neoplasm of unspecified lung: Secondary | ICD-10-CM

## 2013-11-14 DIAGNOSIS — Z5111 Encounter for antineoplastic chemotherapy: Secondary | ICD-10-CM

## 2013-11-14 DIAGNOSIS — C787 Secondary malignant neoplasm of liver and intrahepatic bile duct: Secondary | ICD-10-CM

## 2013-11-14 MED ORDER — SODIUM CHLORIDE 0.9 % IJ SOLN
10.0000 mL | INTRAMUSCULAR | Status: DC | PRN
  Administered 2013-11-14: 10 mL
  Filled 2013-11-14: qty 10

## 2013-11-14 MED ORDER — ONDANSETRON 8 MG/NS 50 ML IVPB
INTRAVENOUS | Status: AC
  Filled 2013-11-14: qty 8

## 2013-11-14 MED ORDER — TEMSIROLIMUS CHEMO INJECTION 25 MG/ML W/DILUENT
25.0000 mg | Freq: Once | INTRAVENOUS | Status: AC
  Administered 2013-11-14: 25 mg via INTRAVENOUS
  Filled 2013-11-14: qty 2.5

## 2013-11-14 MED ORDER — DIPHENHYDRAMINE HCL 50 MG/ML IJ SOLN
INTRAMUSCULAR | Status: AC
  Filled 2013-11-14: qty 1

## 2013-11-14 MED ORDER — HEPARIN SOD (PORK) LOCK FLUSH 100 UNIT/ML IV SOLN
500.0000 [IU] | Freq: Once | INTRAVENOUS | Status: AC | PRN
  Administered 2013-11-14: 500 [IU]
  Filled 2013-11-14: qty 5

## 2013-11-14 MED ORDER — ONDANSETRON 8 MG/50ML IVPB (CHCC)
8.0000 mg | Freq: Once | INTRAVENOUS | Status: AC
  Administered 2013-11-14: 8 mg via INTRAVENOUS

## 2013-11-14 MED ORDER — DIPHENHYDRAMINE HCL 50 MG/ML IJ SOLN
50.0000 mg | Freq: Once | INTRAMUSCULAR | Status: AC
  Administered 2013-11-14: 50 mg via INTRAVENOUS

## 2013-11-14 MED ORDER — SODIUM CHLORIDE 0.9 % IV SOLN
Freq: Once | INTRAVENOUS | Status: AC
  Administered 2013-11-14: 09:00:00 via INTRAVENOUS

## 2013-11-14 NOTE — Patient Instructions (Signed)
[facility] Discharge Instructions for Patients Receiving Chemotherapy  Today you received the following chemotherapy agents Torisel  To help prevent nausea and vomiting after your treatment, we encourage you to take your nausea medication as needed   If you develop nausea and vomiting that is not controlled by your nausea medication, call the clinic.   BELOW ARE SYMPTOMS THAT SHOULD BE REPORTED IMMEDIATELY:  *FEVER GREATER THAN 100.5 F  *CHILLS WITH OR WITHOUT FEVER  NAUSEA AND VOMITING THAT IS NOT CONTROLLED WITH YOUR NAUSEA MEDICATION  *UNUSUAL SHORTNESS OF BREATH  *UNUSUAL BRUISING OR BLEEDING  TENDERNESS IN MOUTH AND THROAT WITH OR WITHOUT PRESENCE OF ULCERS  *URINARY PROBLEMS  *BOWEL PROBLEMS  UNUSUAL RASH Items with * indicate a potential emergency and should be followed up as soon as possible.  Feel free to call the clinic you have any questions or concerns. The clinic phone number is [phone].

## 2013-11-17 ENCOUNTER — Telehealth: Payer: Self-pay | Admitting: Hematology and Oncology

## 2013-11-21 ENCOUNTER — Ambulatory Visit (HOSPITAL_BASED_OUTPATIENT_CLINIC_OR_DEPARTMENT_OTHER): Payer: Medicare HMO

## 2013-11-21 ENCOUNTER — Other Ambulatory Visit (HOSPITAL_BASED_OUTPATIENT_CLINIC_OR_DEPARTMENT_OTHER): Payer: Medicare HMO

## 2013-11-21 VITALS — BP 118/71 | HR 91 | Temp 97.7°F | Resp 18

## 2013-11-21 DIAGNOSIS — C649 Malignant neoplasm of unspecified kidney, except renal pelvis: Secondary | ICD-10-CM

## 2013-11-21 DIAGNOSIS — Z5111 Encounter for antineoplastic chemotherapy: Secondary | ICD-10-CM

## 2013-11-21 LAB — CBC WITH DIFFERENTIAL/PLATELET
BASO%: 1.2 % (ref 0.0–2.0)
BASOS ABS: 0.1 10*3/uL (ref 0.0–0.1)
EOS%: 2.3 % (ref 0.0–7.0)
Eosinophils Absolute: 0.1 10*3/uL (ref 0.0–0.5)
HEMATOCRIT: 33.5 % — AB (ref 34.8–46.6)
HEMOGLOBIN: 11 g/dL — AB (ref 11.6–15.9)
LYMPH#: 2.1 10*3/uL (ref 0.9–3.3)
LYMPH%: 32.7 % (ref 14.0–49.7)
MCH: 26.3 pg (ref 25.1–34.0)
MCHC: 32.8 g/dL (ref 31.5–36.0)
MCV: 80.2 fL (ref 79.5–101.0)
MONO#: 0.6 10*3/uL (ref 0.1–0.9)
MONO%: 8.6 % (ref 0.0–14.0)
NEUT%: 55.2 % (ref 38.4–76.8)
NEUTROS ABS: 3.6 10*3/uL (ref 1.5–6.5)
Platelets: 205 10*3/uL (ref 145–400)
RBC: 4.18 10*6/uL (ref 3.70–5.45)
RDW: 17.3 % — ABNORMAL HIGH (ref 11.2–14.5)
WBC: 6.5 10*3/uL (ref 3.9–10.3)

## 2013-11-21 LAB — COMPREHENSIVE METABOLIC PANEL (CC13)
ALT: 11 U/L (ref 0–55)
ANION GAP: 11 meq/L (ref 3–11)
AST: 12 U/L (ref 5–34)
Albumin: 3.3 g/dL — ABNORMAL LOW (ref 3.5–5.0)
Alkaline Phosphatase: 126 U/L (ref 40–150)
BILIRUBIN TOTAL: 0.36 mg/dL (ref 0.20–1.20)
BUN: 21.1 mg/dL (ref 7.0–26.0)
CO2: 23 meq/L (ref 22–29)
CREATININE: 0.9 mg/dL (ref 0.6–1.1)
Calcium: 9.8 mg/dL (ref 8.4–10.4)
Chloride: 103 mEq/L (ref 98–109)
GLUCOSE: 185 mg/dL — AB (ref 70–140)
Potassium: 4.5 mEq/L (ref 3.5–5.1)
Sodium: 136 mEq/L (ref 136–145)
Total Protein: 7.4 g/dL (ref 6.4–8.3)

## 2013-11-21 MED ORDER — HEPARIN SOD (PORK) LOCK FLUSH 100 UNIT/ML IV SOLN
500.0000 [IU] | Freq: Once | INTRAVENOUS | Status: AC | PRN
  Administered 2013-11-21: 500 [IU]
  Filled 2013-11-21: qty 5

## 2013-11-21 MED ORDER — ONDANSETRON 8 MG/NS 50 ML IVPB
INTRAVENOUS | Status: AC
  Filled 2013-11-21: qty 8

## 2013-11-21 MED ORDER — SODIUM CHLORIDE 0.9 % IJ SOLN
10.0000 mL | INTRAMUSCULAR | Status: DC | PRN
  Administered 2013-11-21: 10 mL
  Filled 2013-11-21: qty 10

## 2013-11-21 MED ORDER — DIPHENHYDRAMINE HCL 50 MG/ML IJ SOLN
INTRAMUSCULAR | Status: AC
  Filled 2013-11-21: qty 1

## 2013-11-21 MED ORDER — SODIUM CHLORIDE 0.9 % IV SOLN
Freq: Once | INTRAVENOUS | Status: AC
  Administered 2013-11-21: 09:00:00 via INTRAVENOUS

## 2013-11-21 MED ORDER — TEMSIROLIMUS CHEMO INJECTION 25 MG/ML W/DILUENT
25.0000 mg | Freq: Once | INTRAVENOUS | Status: AC
  Administered 2013-11-21: 25 mg via INTRAVENOUS
  Filled 2013-11-21: qty 2.5

## 2013-11-21 MED ORDER — ONDANSETRON 8 MG/50ML IVPB (CHCC)
8.0000 mg | Freq: Once | INTRAVENOUS | Status: AC
  Administered 2013-11-21: 8 mg via INTRAVENOUS

## 2013-11-21 MED ORDER — DIPHENHYDRAMINE HCL 50 MG/ML IJ SOLN
50.0000 mg | Freq: Once | INTRAMUSCULAR | Status: AC
  Administered 2013-11-21: 50 mg via INTRAVENOUS

## 2013-11-21 NOTE — Patient Instructions (Signed)
[facility] Discharge Instructions for Patients Receiving Chemotherapy  Today you received the following chemotherapy agents :  Torisel.  To help prevent nausea and vomiting after your treatment, we encourage you to take your nausea medication as instructed by your physician.   If you develop nausea and vomiting that is not controlled by your nausea medication, call the clinic.   BELOW ARE SYMPTOMS THAT SHOULD BE REPORTED IMMEDIATELY:  *FEVER GREATER THAN 100.5 F  *CHILLS WITH OR WITHOUT FEVER  NAUSEA AND VOMITING THAT IS NOT CONTROLLED WITH YOUR NAUSEA MEDICATION  *UNUSUAL SHORTNESS OF BREATH  *UNUSUAL BRUISING OR BLEEDING  TENDERNESS IN MOUTH AND THROAT WITH OR WITHOUT PRESENCE OF ULCERS  *URINARY PROBLEMS  *BOWEL PROBLEMS  UNUSUAL RASH Items with * indicate a potential emergency and should be followed up as soon as possible.  Feel free to call the clinic you have any questions or concerns. The clinic phone number is [phone].

## 2013-11-28 ENCOUNTER — Ambulatory Visit (HOSPITAL_BASED_OUTPATIENT_CLINIC_OR_DEPARTMENT_OTHER): Payer: Medicare HMO

## 2013-11-28 ENCOUNTER — Encounter: Payer: Self-pay | Admitting: Hematology and Oncology

## 2013-11-28 ENCOUNTER — Other Ambulatory Visit (HOSPITAL_BASED_OUTPATIENT_CLINIC_OR_DEPARTMENT_OTHER): Payer: Medicare HMO

## 2013-11-28 ENCOUNTER — Ambulatory Visit (HOSPITAL_BASED_OUTPATIENT_CLINIC_OR_DEPARTMENT_OTHER): Payer: Medicare HMO | Admitting: Hematology and Oncology

## 2013-11-28 ENCOUNTER — Telehealth: Payer: Self-pay | Admitting: Hematology and Oncology

## 2013-11-28 VITALS — BP 122/61 | HR 105 | Temp 97.9°F | Resp 18 | Ht 67.0 in | Wt 120.9 lb

## 2013-11-28 DIAGNOSIS — R079 Chest pain, unspecified: Secondary | ICD-10-CM

## 2013-11-28 DIAGNOSIS — C787 Secondary malignant neoplasm of liver and intrahepatic bile duct: Secondary | ICD-10-CM

## 2013-11-28 DIAGNOSIS — C649 Malignant neoplasm of unspecified kidney, except renal pelvis: Secondary | ICD-10-CM

## 2013-11-28 DIAGNOSIS — J441 Chronic obstructive pulmonary disease with (acute) exacerbation: Secondary | ICD-10-CM

## 2013-11-28 DIAGNOSIS — C7800 Secondary malignant neoplasm of unspecified lung: Secondary | ICD-10-CM

## 2013-11-28 DIAGNOSIS — R059 Cough, unspecified: Secondary | ICD-10-CM

## 2013-11-28 DIAGNOSIS — E2749 Other adrenocortical insufficiency: Secondary | ICD-10-CM

## 2013-11-28 DIAGNOSIS — Z5111 Encounter for antineoplastic chemotherapy: Secondary | ICD-10-CM

## 2013-11-28 DIAGNOSIS — R05 Cough: Secondary | ICD-10-CM

## 2013-11-28 DIAGNOSIS — R109 Unspecified abdominal pain: Secondary | ICD-10-CM

## 2013-11-28 DIAGNOSIS — D649 Anemia, unspecified: Secondary | ICD-10-CM

## 2013-11-28 DIAGNOSIS — R634 Abnormal weight loss: Secondary | ICD-10-CM

## 2013-11-28 LAB — CBC WITH DIFFERENTIAL/PLATELET
BASO%: 1.4 % (ref 0.0–2.0)
Basophils Absolute: 0.1 10*3/uL (ref 0.0–0.1)
EOS%: 2.3 % (ref 0.0–7.0)
Eosinophils Absolute: 0.2 10*3/uL (ref 0.0–0.5)
HEMATOCRIT: 33.5 % — AB (ref 34.8–46.6)
HGB: 11.1 g/dL — ABNORMAL LOW (ref 11.6–15.9)
LYMPH%: 24.8 % (ref 14.0–49.7)
MCH: 26.2 pg (ref 25.1–34.0)
MCHC: 33.2 g/dL (ref 31.5–36.0)
MCV: 79 fL — ABNORMAL LOW (ref 79.5–101.0)
MONO#: 0.5 10*3/uL (ref 0.1–0.9)
MONO%: 7 % (ref 0.0–14.0)
NEUT#: 4.7 10*3/uL (ref 1.5–6.5)
NEUT%: 64.5 % (ref 38.4–76.8)
PLATELETS: 229 10*3/uL (ref 145–400)
RBC: 4.24 10*6/uL (ref 3.70–5.45)
RDW: 16.4 % — ABNORMAL HIGH (ref 11.2–14.5)
WBC: 7.4 10*3/uL (ref 3.9–10.3)
lymph#: 1.8 10*3/uL (ref 0.9–3.3)

## 2013-11-28 LAB — COMPREHENSIVE METABOLIC PANEL (CC13)
ALBUMIN: 3.4 g/dL — AB (ref 3.5–5.0)
ALK PHOS: 140 U/L (ref 40–150)
ALT: 7 U/L (ref 0–55)
ANION GAP: 10 meq/L (ref 3–11)
AST: 10 U/L (ref 5–34)
BUN: 21.1 mg/dL (ref 7.0–26.0)
CO2: 23 meq/L (ref 22–29)
Calcium: 9.7 mg/dL (ref 8.4–10.4)
Chloride: 102 mEq/L (ref 98–109)
Creatinine: 1 mg/dL (ref 0.6–1.1)
GLUCOSE: 244 mg/dL — AB (ref 70–140)
Potassium: 4.2 mEq/L (ref 3.5–5.1)
SODIUM: 135 meq/L — AB (ref 136–145)
TOTAL PROTEIN: 7.4 g/dL (ref 6.4–8.3)
Total Bilirubin: 0.38 mg/dL (ref 0.20–1.20)

## 2013-11-28 MED ORDER — DIPHENHYDRAMINE HCL 50 MG/ML IJ SOLN
INTRAMUSCULAR | Status: AC
  Filled 2013-11-28: qty 1

## 2013-11-28 MED ORDER — ONDANSETRON 8 MG/50ML IVPB (CHCC)
8.0000 mg | Freq: Once | INTRAVENOUS | Status: AC
  Administered 2013-11-28: 8 mg via INTRAVENOUS

## 2013-11-28 MED ORDER — ONDANSETRON 8 MG/NS 50 ML IVPB
INTRAVENOUS | Status: AC
  Filled 2013-11-28: qty 8

## 2013-11-28 MED ORDER — TEMSIROLIMUS CHEMO INJECTION 25 MG/ML W/DILUENT
25.0000 mg | Freq: Once | INTRAVENOUS | Status: AC
  Administered 2013-11-28: 25 mg via INTRAVENOUS
  Filled 2013-11-28: qty 2.5

## 2013-11-28 MED ORDER — HEPARIN SOD (PORK) LOCK FLUSH 100 UNIT/ML IV SOLN
500.0000 [IU] | Freq: Once | INTRAVENOUS | Status: AC | PRN
  Administered 2013-11-28: 500 [IU]
  Filled 2013-11-28: qty 5

## 2013-11-28 MED ORDER — DIPHENHYDRAMINE HCL 50 MG/ML IJ SOLN
50.0000 mg | Freq: Once | INTRAMUSCULAR | Status: AC
  Administered 2013-11-28: 50 mg via INTRAVENOUS

## 2013-11-28 MED ORDER — SODIUM CHLORIDE 0.9 % IJ SOLN
10.0000 mL | INTRAMUSCULAR | Status: DC | PRN
  Administered 2013-11-28: 10 mL
  Filled 2013-11-28: qty 10

## 2013-11-28 MED ORDER — SODIUM CHLORIDE 0.9 % IV SOLN
Freq: Once | INTRAVENOUS | Status: AC
  Administered 2013-11-28: 13:00:00 via INTRAVENOUS

## 2013-11-28 NOTE — Progress Notes (Signed)
[place] OFFICE PROGRESS NOTE  Patient Care Team: [name], MD as PCP - General (Internal Medicine) Heath Lark, MD as Consulting Physician (Hematology and Oncology)  DIAGNOSIS: Metastatic renal cell carcinoma to the lungs and liver, ongoing palliative chemotherapy  SUMMARY OF ONCOLOGIC HISTORY: Oncology History   Renal cell carcinoma   Primary site: Kidney (Left)   Staging method: AJCC 7th Edition   Clinical: Stage IV (T3a, N0, M1) signed by Heath Lark, MD on 10/21/2013  8:46 AM   Pathologic: Stage IV (T3a, N0, M1) signed by Heath Lark, MD on 10/21/2013  8:47 AM   Summary: Stage IV (T3a, N0, M1)       Renal cell carcinoma   10/30/2010 Imaging Negative CT abdomen   09/15/2013 Surgery Patient underwent left nephrectomy which confirmed RCC, Fuhrman grade III/IV   09/27/2013 Imaging CT abdomen showed liver metastasis   09/29/2013 Procedure CT guided liver biopsy confrimed metastatic disease   09/29/2013 Imaging CT chest showed enlarging pulmonary metastases   09/30/2013 - 10/10/2013 Hospital Admission Admitted for orthostatic hypotension and headaches and severe malnutrition   10/21/2013 - 10/23/2013 Hospital Admission She was admitted to the hospital with recurrent urinary tract infection, dehydration, severe headaches and adrenal insufficiency.   11/07/2013 -  Chemotherapy The patient begun palliative chemotherapy with weekly Temsirolimus    INTERVAL HISTORY: Kaylee [name] 73 y.o. female returns for further followup. She still had persistent pain in her right lower abdomen and but is slightly improved. She is still smoking about half a pack of cigarettes a day and attempting to quit on her own. She has occasional coughing that causes pain around her sternum. She has not needed pain medicine for a while. Denies any recent headaches.  I have reviewed the past medical history, past surgical history, social history and family history with the patient and they are  unchanged from previous note.  ALLERGIES:  is allergic to penicillins and codeine.  MEDICATIONS:  Current Outpatient Prescriptions  Medication Sig Dispense Refill  . ALPRAZolam (XANAX) 0.25 MG tablet Take 1 tablet (0.25 mg total) by mouth 2 (two) times daily as needed for anxiety or sleep.  30 tablet  0  . butalbital-acetaminophen-caffeine (FIORICET, ESGIC) 50-325-40 MG per tablet Take 1 tablet by mouth every 6 (six) hours as needed for headache.      . lidocaine-prilocaine (EMLA) cream Apply 1 application topically as needed (port).      Marland Kitchen omeprazole (PRILOSEC) 40 MG capsule Take 40 mg by mouth daily.      . Oxycodone HCl 10 MG TABS Take 10 mg by mouth every 3 (three) hours as needed (pain).      Marland Kitchen PARoxetine (PAXIL) 20 MG tablet Take 20 mg by mouth every evening.      . polyethylene glycol (MIRALAX / GLYCOLAX) packet Take 17 g by mouth daily.      . predniSONE (DELTASONE) 10 MG tablet Take 10 mg by mouth 2 (two) times daily with a meal.      . promethazine (PHENERGAN) 25 MG tablet Take 25 mg by mouth every 6 (six) hours as needed for nausea.       No current facility-administered medications for this visit.    REVIEW OF SYSTEMS:   Constitutional: Denies fevers, chills or abnormal weight loss Eyes: Denies blurriness of vision Ears, nose, mouth, throat, and face: Denies mucositis or sore throat Respiratory: Denies cough, dyspnea or wheezes Cardiovascular: Denies palpitation, chest discomfort or lower extremity swelling Gastrointestinal:  Denies  nausea, heartburn or change in bowel habits Skin: Denies abnormal skin rashes Lymphatics: Denies new lymphadenopathy or easy bruising Neurological:Denies numbness, tingling or new weaknesses Behavioral/Psych: Mood is stable, no new changes  All other systems were reviewed with the patient and are negative.  PHYSICAL EXAMINATION: ECOG PERFORMANCE STATUS: 1 - Symptomatic but completely ambulatory  Filed Vitals:   11/28/13 1148  BP: 122/61   Pulse: 105  Temp: 97.9 F (36.6 C)  Resp: 18   Filed Weights   11/28/13 1148  Weight: 120 lb 14.4 oz (54.84 kg)    GENERAL:alert, no distress and comfortable. She looks thin and cachectic SKIN: skin color, texture, turgor are normal, no rashes or significant lesions EYES: normal, Conjunctiva are pink and non-injected, sclera clear OROPHARYNX:no exudate, no erythema and lips, buccal mucosa, and tongue normal  NECK: supple, thyroid normal size, non-tender, without nodularity LYMPH:  no palpable lymphadenopathy in the cervical, axillary or inguinal LUNGS: clear to auscultation and percussion with normal breathing effort HEART: regular rate & rhythm and no murmurs and no lower extremity edema ABDOMEN:abdomen soft, non-tender and normal bowel sounds; well-healed surgical scar with a small hernia in the right side of the midline close to the incision site Musculoskeletal:no cyanosis of digits and no clubbing  NEURO: alert & oriented x 3 with fluent speech, no focal motor/sensory deficits  LABORATORY DATA:  I have reviewed the data as listed    Component Value Date/Time   NA 136 11/21/2013 0759   NA 135* 10/23/2013 0409   K 4.5 11/21/2013 0759   K 4.6 10/23/2013 0409   CL 99 10/23/2013 0409   CO2 23 11/21/2013 0759   CO2 26 10/23/2013 0409   GLUCOSE 185* 11/21/2013 0759   GLUCOSE 215* 10/23/2013 0409   BUN 21.1 11/21/2013 0759   BUN 16 10/23/2013 0409   CREATININE 0.9 11/21/2013 0759   CREATININE 0.74 10/23/2013 0409   CALCIUM 9.8 11/21/2013 0759   CALCIUM 9.0 10/23/2013 0409   PROT 7.4 11/21/2013 0759   PROT 7.0 10/15/2013 1316   ALBUMIN 3.3* 11/21/2013 0759   ALBUMIN 3.5 10/15/2013 1316   AST 12 11/21/2013 0759   AST 12 10/15/2013 1316   ALT 11 11/21/2013 0759   ALT 10 10/15/2013 1316   ALKPHOS 126 11/21/2013 0759   ALKPHOS 81 10/15/2013 1316   BILITOT 0.36 11/21/2013 0759   BILITOT 0.6 10/15/2013 1316   GFRNONAA 82* 10/23/2013 0409   GFRAA >90 10/23/2013 0409    No results found for this  basename: SPEP,  UPEP,   kappa and lambda light chains    Lab Results  Component Value Date   WBC 7.4 11/28/2013   NEUTROABS 4.7 11/28/2013   HGB 11.1* 11/28/2013   HCT 33.5* 11/28/2013   MCV 79.0* 11/28/2013   PLT 229 11/28/2013      Chemistry      Component Value Date/Time   NA 136 11/21/2013 0759   NA 135* 10/23/2013 0409   K 4.5 11/21/2013 0759   K 4.6 10/23/2013 0409   CL 99 10/23/2013 0409   CO2 23 11/21/2013 0759   CO2 26 10/23/2013 0409   BUN 21.1 11/21/2013 0759   BUN 16 10/23/2013 0409   CREATININE 0.9 11/21/2013 0759   CREATININE 0.74 10/23/2013 0409      Component Value Date/Time   CALCIUM 9.8 11/21/2013 0759   CALCIUM 9.0 10/23/2013 0409   ALKPHOS 126 11/21/2013 0759   ALKPHOS 81 10/15/2013 1316   AST 12 11/21/2013 0759  AST 12 10/15/2013 1316   ALT 11 11/21/2013 0759   ALT 10 10/15/2013 1316   BILITOT 0.36 11/21/2013 0759   BILITOT 0.6 10/15/2013 1316      ASSESSMENT & PLAN:  #1 stage IV metastatic kidney cancer Her performance status has improved since recent admission.  she tolerated chemotherapy well. I will see her back in 2 weeks #2 tobacco abuse I spent some time educating the patient importance of nicotine cessation #3 adrenal insufficiency Currently she is taking prednisone 5 mg twice daily. I recommend she reduce her prednisone to 5 mg daily. Start end of next week, she will start taper every other day. #4 weight loss This is due to untreated cancer. I recommend she increase oral intake. She is currently on prednisone #5 anemia This is likely anemia of chronic disease. The patient denies recent history of bleeding such as epistaxis, hematuria or hematochezia. She is asymptomatic from the anemia. We will observe for now.  She does not require transfusion now.  #6 abdominal pain This is due to possible hernia from coughing. I recommend observation for now #7 cough This could be due to COPD. She has no evidence of leukocytosis. It could also be viral in nature. We will  observe for now. All questions were answered. The patient knows to call the clinic with any problems, questions or concerns. No barriers to learning was detected. I spent 25 minutes counseling the patient face to face. The total time spent in the appointment was 40 minutes and more than 50% was on counseling and review of test results     [facility], [place], MD 11/28/2013 12:27 PM

## 2013-11-28 NOTE — Telephone Encounter (Signed)
gv adn printed appt scehd and avs for pt for Feb and March

## 2013-11-28 NOTE — Patient Instructions (Signed)
[facility] Discharge Instructions for Patients Receiving Chemotherapy  Today you received the following chemotherapy agents Torisel.  To help prevent nausea and vomiting after your treatment, we encourage you to take your nausea medication if needed.    If you develop nausea and vomiting that is not controlled by your nausea medication, call the clinic.   BELOW ARE SYMPTOMS THAT SHOULD BE REPORTED IMMEDIATELY:  *FEVER GREATER THAN 100.5 F  *CHILLS WITH OR WITHOUT FEVER  NAUSEA AND VOMITING THAT IS NOT CONTROLLED WITH YOUR NAUSEA MEDICATION  *UNUSUAL SHORTNESS OF BREATH  *UNUSUAL BRUISING OR BLEEDING  TENDERNESS IN MOUTH AND THROAT WITH OR WITHOUT PRESENCE OF ULCERS  *URINARY PROBLEMS  *BOWEL PROBLEMS  UNUSUAL RASH Items with * indicate a potential emergency and should be followed up as soon as possible.  Feel free to call the clinic you have any questions or concerns. The clinic phone number is [phone].

## 2013-12-05 ENCOUNTER — Ambulatory Visit: Payer: Medicare HMO

## 2013-12-05 ENCOUNTER — Other Ambulatory Visit: Payer: Medicare HMO

## 2013-12-05 ENCOUNTER — Ambulatory Visit (HOSPITAL_BASED_OUTPATIENT_CLINIC_OR_DEPARTMENT_OTHER): Payer: Medicare HMO

## 2013-12-05 ENCOUNTER — Other Ambulatory Visit (HOSPITAL_BASED_OUTPATIENT_CLINIC_OR_DEPARTMENT_OTHER): Payer: Medicare HMO

## 2013-12-05 VITALS — BP 130/77 | HR 95 | Temp 96.9°F | Resp 18

## 2013-12-05 DIAGNOSIS — C787 Secondary malignant neoplasm of liver and intrahepatic bile duct: Secondary | ICD-10-CM

## 2013-12-05 DIAGNOSIS — C649 Malignant neoplasm of unspecified kidney, except renal pelvis: Secondary | ICD-10-CM

## 2013-12-05 DIAGNOSIS — C7800 Secondary malignant neoplasm of unspecified lung: Secondary | ICD-10-CM

## 2013-12-05 DIAGNOSIS — Z5111 Encounter for antineoplastic chemotherapy: Secondary | ICD-10-CM

## 2013-12-05 LAB — CBC WITH DIFFERENTIAL/PLATELET
BASO%: 1.4 % (ref 0.0–2.0)
Basophils Absolute: 0.1 10*3/uL (ref 0.0–0.1)
EOS%: 1.6 % (ref 0.0–7.0)
Eosinophils Absolute: 0.1 10*3/uL (ref 0.0–0.5)
HCT: 34.2 % — ABNORMAL LOW (ref 34.8–46.6)
HGB: 11.1 g/dL — ABNORMAL LOW (ref 11.6–15.9)
LYMPH#: 2.4 10*3/uL (ref 0.9–3.3)
LYMPH%: 26.8 % (ref 14.0–49.7)
MCH: 25.8 pg (ref 25.1–34.0)
MCHC: 32.6 g/dL (ref 31.5–36.0)
MCV: 79.1 fL — ABNORMAL LOW (ref 79.5–101.0)
MONO#: 0.6 10*3/uL (ref 0.1–0.9)
MONO%: 6.8 % (ref 0.0–14.0)
NEUT#: 5.8 10*3/uL (ref 1.5–6.5)
NEUT%: 63.4 % (ref 38.4–76.8)
Platelets: 215 10*3/uL (ref 145–400)
RBC: 4.32 10*6/uL (ref 3.70–5.45)
RDW: 16.4 % — ABNORMAL HIGH (ref 11.2–14.5)
WBC: 9.1 10*3/uL (ref 3.9–10.3)

## 2013-12-05 LAB — COMPREHENSIVE METABOLIC PANEL (CC13)
ALT: 8 U/L (ref 0–55)
ANION GAP: 11 meq/L (ref 3–11)
AST: 10 U/L (ref 5–34)
Albumin: 3.3 g/dL — ABNORMAL LOW (ref 3.5–5.0)
Alkaline Phosphatase: 141 U/L (ref 40–150)
BUN: 17.9 mg/dL (ref 7.0–26.0)
CALCIUM: 9.7 mg/dL (ref 8.4–10.4)
CO2: 22 mEq/L (ref 22–29)
Chloride: 101 mEq/L (ref 98–109)
Creatinine: 1 mg/dL (ref 0.6–1.1)
Glucose: 193 mg/dl — ABNORMAL HIGH (ref 70–140)
POTASSIUM: 4.2 meq/L (ref 3.5–5.1)
Sodium: 134 mEq/L — ABNORMAL LOW (ref 136–145)
Total Bilirubin: 0.29 mg/dL (ref 0.20–1.20)
Total Protein: 7.4 g/dL (ref 6.4–8.3)

## 2013-12-05 MED ORDER — ONDANSETRON 8 MG/50ML IVPB (CHCC)
8.0000 mg | Freq: Once | INTRAVENOUS | Status: AC
  Administered 2013-12-05: 8 mg via INTRAVENOUS

## 2013-12-05 MED ORDER — SODIUM CHLORIDE 0.9 % IJ SOLN
10.0000 mL | INTRAMUSCULAR | Status: DC | PRN
  Administered 2013-12-05: 10 mL
  Filled 2013-12-05: qty 10

## 2013-12-05 MED ORDER — ONDANSETRON 8 MG/NS 50 ML IVPB
INTRAVENOUS | Status: AC
  Filled 2013-12-05: qty 8

## 2013-12-05 MED ORDER — TEMSIROLIMUS CHEMO INJECTION 25 MG/ML W/DILUENT
25.0000 mg | Freq: Once | INTRAVENOUS | Status: AC
  Administered 2013-12-05: 25 mg via INTRAVENOUS
  Filled 2013-12-05: qty 2.5

## 2013-12-05 MED ORDER — DIPHENHYDRAMINE HCL 50 MG/ML IJ SOLN
50.0000 mg | Freq: Once | INTRAMUSCULAR | Status: AC
  Administered 2013-12-05: 50 mg via INTRAVENOUS

## 2013-12-05 MED ORDER — HEPARIN SOD (PORK) LOCK FLUSH 100 UNIT/ML IV SOLN
500.0000 [IU] | Freq: Once | INTRAVENOUS | Status: AC | PRN
  Administered 2013-12-05: 500 [IU]
  Filled 2013-12-05: qty 5

## 2013-12-05 MED ORDER — SODIUM CHLORIDE 0.9 % IV SOLN
Freq: Once | INTRAVENOUS | Status: AC
  Administered 2013-12-05: 12:00:00 via INTRAVENOUS

## 2013-12-05 MED ORDER — DIPHENHYDRAMINE HCL 50 MG/ML IJ SOLN
INTRAMUSCULAR | Status: AC
Start: 2013-12-05 — End: 2013-12-05
  Filled 2013-12-05: qty 1

## 2013-12-05 NOTE — Patient Instructions (Signed)
[facility] Discharge Instructions for Patients Receiving Chemotherapy  Today you received the following chemotherapy agents: Torisel  To help prevent nausea and vomiting after your treatment, we encourage you to take your nausea medication as prescribed.    If you develop nausea and vomiting that is not controlled by your nausea medication, call the clinic.   BELOW ARE SYMPTOMS THAT SHOULD BE REPORTED IMMEDIATELY:  *FEVER GREATER THAN 100.5 F  *CHILLS WITH OR WITHOUT FEVER  NAUSEA AND VOMITING THAT IS NOT CONTROLLED WITH YOUR NAUSEA MEDICATION  *UNUSUAL SHORTNESS OF BREATH  *UNUSUAL BRUISING OR BLEEDING  TENDERNESS IN MOUTH AND THROAT WITH OR WITHOUT PRESENCE OF ULCERS  *URINARY PROBLEMS  *BOWEL PROBLEMS  UNUSUAL RASH Items with * indicate a potential emergency and should be followed up as soon as possible.  Feel free to call the clinic you have any questions or concerns. The clinic phone number is [phone].

## 2013-12-12 ENCOUNTER — Ambulatory Visit (HOSPITAL_BASED_OUTPATIENT_CLINIC_OR_DEPARTMENT_OTHER): Payer: Commercial Managed Care - HMO | Admitting: Hematology and Oncology

## 2013-12-12 ENCOUNTER — Encounter (INDEPENDENT_AMBULATORY_CARE_PROVIDER_SITE_OTHER): Payer: Self-pay

## 2013-12-12 ENCOUNTER — Ambulatory Visit: Payer: Medicare HMO

## 2013-12-12 ENCOUNTER — Other Ambulatory Visit: Payer: Medicare HMO

## 2013-12-12 ENCOUNTER — Ambulatory Visit: Payer: Medicare HMO | Admitting: Hematology and Oncology

## 2013-12-12 ENCOUNTER — Other Ambulatory Visit (HOSPITAL_BASED_OUTPATIENT_CLINIC_OR_DEPARTMENT_OTHER): Payer: Medicare HMO

## 2013-12-12 ENCOUNTER — Ambulatory Visit (HOSPITAL_BASED_OUTPATIENT_CLINIC_OR_DEPARTMENT_OTHER): Payer: Commercial Managed Care - HMO

## 2013-12-12 VITALS — BP 122/64 | HR 104 | Temp 96.8°F | Resp 19 | Ht 67.0 in | Wt 122.3 lb

## 2013-12-12 DIAGNOSIS — R05 Cough: Secondary | ICD-10-CM

## 2013-12-12 DIAGNOSIS — E2749 Other adrenocortical insufficiency: Secondary | ICD-10-CM

## 2013-12-12 DIAGNOSIS — Z5111 Encounter for antineoplastic chemotherapy: Secondary | ICD-10-CM

## 2013-12-12 DIAGNOSIS — F17200 Nicotine dependence, unspecified, uncomplicated: Secondary | ICD-10-CM

## 2013-12-12 DIAGNOSIS — R630 Anorexia: Secondary | ICD-10-CM

## 2013-12-12 DIAGNOSIS — C649 Malignant neoplasm of unspecified kidney, except renal pelvis: Secondary | ICD-10-CM

## 2013-12-12 DIAGNOSIS — R7309 Other abnormal glucose: Secondary | ICD-10-CM

## 2013-12-12 DIAGNOSIS — E119 Type 2 diabetes mellitus without complications: Secondary | ICD-10-CM

## 2013-12-12 DIAGNOSIS — C787 Secondary malignant neoplasm of liver and intrahepatic bile duct: Secondary | ICD-10-CM

## 2013-12-12 DIAGNOSIS — R059 Cough, unspecified: Secondary | ICD-10-CM

## 2013-12-12 DIAGNOSIS — C7800 Secondary malignant neoplasm of unspecified lung: Secondary | ICD-10-CM

## 2013-12-12 DIAGNOSIS — D649 Anemia, unspecified: Secondary | ICD-10-CM

## 2013-12-12 LAB — CBC WITH DIFFERENTIAL/PLATELET
BASO%: 0.5 % (ref 0.0–2.0)
BASOS ABS: 0 10*3/uL (ref 0.0–0.1)
EOS ABS: 0.2 10*3/uL (ref 0.0–0.5)
EOS%: 1.9 % (ref 0.0–7.0)
HEMATOCRIT: 33.3 % — AB (ref 34.8–46.6)
HGB: 10.9 g/dL — ABNORMAL LOW (ref 11.6–15.9)
LYMPH%: 27.2 % (ref 14.0–49.7)
MCH: 25.5 pg (ref 25.1–34.0)
MCHC: 32.7 g/dL (ref 31.5–36.0)
MCV: 77.8 fL — ABNORMAL LOW (ref 79.5–101.0)
MONO#: 0.6 10*3/uL (ref 0.1–0.9)
MONO%: 7.1 % (ref 0.0–14.0)
NEUT#: 5 10*3/uL (ref 1.5–6.5)
NEUT%: 63.3 % (ref 38.4–76.8)
Platelets: 215 10*3/uL (ref 145–400)
RBC: 4.28 10*6/uL (ref 3.70–5.45)
RDW: 15.5 % — AB (ref 11.2–14.5)
WBC: 7.9 10*3/uL (ref 3.9–10.3)
lymph#: 2.2 10*3/uL (ref 0.9–3.3)
nRBC: 0 % (ref 0–0)

## 2013-12-12 LAB — COMPREHENSIVE METABOLIC PANEL (CC13)
ALT: 6 U/L (ref 0–55)
ANION GAP: 10 meq/L (ref 3–11)
AST: 11 U/L (ref 5–34)
Albumin: 3.3 g/dL — ABNORMAL LOW (ref 3.5–5.0)
Alkaline Phosphatase: 137 U/L (ref 40–150)
BUN: 16.3 mg/dL (ref 7.0–26.0)
CALCIUM: 9.6 mg/dL (ref 8.4–10.4)
CO2: 22 meq/L (ref 22–29)
CREATININE: 1 mg/dL (ref 0.6–1.1)
Chloride: 100 mEq/L (ref 98–109)
Glucose: 194 mg/dl — ABNORMAL HIGH (ref 70–140)
Potassium: 3.9 mEq/L (ref 3.5–5.1)
SODIUM: 133 meq/L — AB (ref 136–145)
TOTAL PROTEIN: 7.4 g/dL (ref 6.4–8.3)
Total Bilirubin: 0.31 mg/dL (ref 0.20–1.20)

## 2013-12-12 MED ORDER — HEPARIN SOD (PORK) LOCK FLUSH 100 UNIT/ML IV SOLN
500.0000 [IU] | Freq: Once | INTRAVENOUS | Status: AC | PRN
Start: 2013-12-12 — End: 2013-12-12
  Administered 2013-12-12: 500 [IU]
  Filled 2013-12-12: qty 5

## 2013-12-12 MED ORDER — SODIUM CHLORIDE 0.9 % IV SOLN
Freq: Once | INTRAVENOUS | Status: AC
  Administered 2013-12-12: 14:00:00 via INTRAVENOUS

## 2013-12-12 MED ORDER — DIPHENHYDRAMINE HCL 50 MG/ML IJ SOLN
INTRAMUSCULAR | Status: AC
  Filled 2013-12-12: qty 1

## 2013-12-12 MED ORDER — TEMSIROLIMUS CHEMO INJECTION 25 MG/ML W/DILUENT
25.0000 mg | Freq: Once | INTRAVENOUS | Status: AC
  Administered 2013-12-12: 25 mg via INTRAVENOUS
  Filled 2013-12-12: qty 2.5

## 2013-12-12 MED ORDER — ONDANSETRON 8 MG/NS 50 ML IVPB
INTRAVENOUS | Status: AC
  Filled 2013-12-12: qty 8

## 2013-12-12 MED ORDER — DIPHENHYDRAMINE HCL 50 MG/ML IJ SOLN
50.0000 mg | Freq: Once | INTRAMUSCULAR | Status: AC
  Administered 2013-12-12: 50 mg via INTRAVENOUS

## 2013-12-12 MED ORDER — DEXAMETHASONE SODIUM PHOSPHATE 10 MG/ML IJ SOLN
INTRAMUSCULAR | Status: AC
  Filled 2013-12-12: qty 1

## 2013-12-12 MED ORDER — METFORMIN HCL 500 MG PO TABS: 500.0000 mg | ORAL_TABLET | Freq: Every day | ORAL | Status: DC

## 2013-12-12 MED ORDER — ONDANSETRON 8 MG/50ML IVPB (CHCC)
8.0000 mg | Freq: Once | INTRAVENOUS | Status: AC
  Administered 2013-12-12: 8 mg via INTRAVENOUS

## 2013-12-12 MED ORDER — SODIUM CHLORIDE 0.9 % IJ SOLN
10.0000 mL | INTRAMUSCULAR | Status: DC | PRN
  Administered 2013-12-12: 10 mL
  Filled 2013-12-12: qty 10

## 2013-12-12 NOTE — Progress Notes (Signed)
[name] OFFICE PROGRESS NOTE  Patient Care Team: [name], MD as PCP - General (Internal Medicine) Heath Lark, MD as Consulting Physician (Hematology and Oncology)  DIAGNOSIS: Metastatic kidney cancer to the lungs and liver, ongoing palliative chemotherapy  SUMMARY OF ONCOLOGIC HISTORY: Oncology History   Renal cell carcinoma   Primary site: Kidney (Left)   Staging method: AJCC 7th Edition   Clinical: Stage IV (T3a, N0, M1) signed by Heath Lark, MD on 10/21/2013  8:46 AM   Pathologic: Stage IV (T3a, N0, M1) signed by Heath Lark, MD on 10/21/2013  8:47 AM   Summary: Stage IV (T3a, N0, M1)       Renal cell carcinoma   10/30/2010 Imaging Negative CT abdomen   09/15/2013 Surgery Patient underwent left nephrectomy which confirmed RCC, Fuhrman grade III/IV   09/27/2013 Imaging CT abdomen showed liver metastasis   09/29/2013 Procedure CT guided liver biopsy confrimed metastatic disease   09/29/2013 Imaging CT chest showed enlarging pulmonary metastases   09/30/2013 - 10/10/2013 Hospital Admission Admitted for orthostatic hypotension and headaches and severe malnutrition   10/21/2013 - 10/23/2013 Hospital Admission She was admitted to the hospital with recurrent urinary tract infection, dehydration, severe headaches and adrenal insufficiency.   11/07/2013 -  Chemotherapy The patient begun palliative chemotherapy with weekly Temsirolimus    INTERVAL HISTORY: Kaylee [name] 73 y.o. female returns for further followup. Recently, she had to call the emergency medical service due to tachycardia. She denies any palpitation, chest pain or shortness of breath. She complained of mild dizziness. She was evaluated and was subsequently recommended to stay at home. The patient is still smoking about half a pack of cigarettes a day and attempting to quit. The pain in her abdomen from the hernia has improved. She has not needed to take pain medicine at all. She still have  occasional coughing but it is nonproductive. The pain around her sternum has resolved.  I have reviewed the past medical history, past surgical history, social history and family history with the patient and they are unchanged from previous note.  ALLERGIES:  is allergic to penicillins and codeine.  MEDICATIONS:  Current Outpatient Prescriptions  Medication Sig Dispense Refill  . ALPRAZolam (XANAX) 0.25 MG tablet Take 1 tablet (0.25 mg total) by mouth 2 (two) times daily as needed for anxiety or sleep.  30 tablet  0  . butalbital-acetaminophen-caffeine (FIORICET, ESGIC) 50-325-40 MG per tablet Take 1 tablet by mouth every 6 (six) hours as needed for headache.      . lidocaine-prilocaine (EMLA) cream Apply 1 application topically as needed (port).      Marland Kitchen omeprazole (PRILOSEC) 40 MG capsule Take 40 mg by mouth daily.      . Oxycodone HCl 10 MG TABS Take 10 mg by mouth every 3 (three) hours as needed (pain).      Marland Kitchen PARoxetine (PAXIL) 20 MG tablet Take 20 mg by mouth every evening.      . polyethylene glycol (MIRALAX / GLYCOLAX) packet Take 17 g by mouth daily.      . predniSONE (DELTASONE) 10 MG tablet Take 10 mg by mouth 2 (two) times daily with a meal.      . promethazine (PHENERGAN) 25 MG tablet Take 25 mg by mouth every 6 (six) hours as needed for nausea.      . metFORMIN (GLUCOPHAGE) 500 MG tablet Take 1 tablet (500 mg total) by mouth daily with breakfast.  30 tablet  1   No  current facility-administered medications for this visit.    REVIEW OF SYSTEMS:   Constitutional: Denies fevers, chills or abnormal weight loss Eyes: Denies blurriness of vision Ears, nose, mouth, throat, and face: Denies mucositis or sore throat Respiratory: Denies dyspnea or wheezes Cardiovascular: Denies palpitation, chest discomfort or lower extremity swelling Gastrointestinal:  Denies nausea, heartburn or change in bowel habits Skin: Denies abnormal skin rashes Lymphatics: Denies new lymphadenopathy or easy  bruising Neurological:Denies numbness, tingling or new weaknesses Behavioral/Psych: Mood is stable, no new changes  All other systems were reviewed with the patient and are negative.  PHYSICAL EXAMINATION: ECOG PERFORMANCE STATUS: 1 - Symptomatic but completely ambulatory  Filed Vitals:   12/12/13 1227  BP: 122/64  Pulse: 104  Temp: 96.8 F (36 C)  Resp: 19   Filed Weights   12/12/13 1227  Weight: 122 lb 4.8 oz (55.475 kg)    GENERAL:alert, no distress and comfortable. She looks thin and mildly cachectic SKIN: skin color, texture, turgor are normal, no rashes or significant lesions EYES: normal, Conjunctiva are pink and non-injected, sclera clear OROPHARYNX:no exudate, no erythema and lips, buccal mucosa, and tongue normal  NECK: supple, thyroid normal size, non-tender, without nodularity LYMPH:  no palpable lymphadenopathy in the cervical, axillary or inguinal LUNGS: clear to auscultation and percussion with normal breathing effort HEART: regular rate & rhythm and no murmurs and no lower extremity edema ABDOMEN:abdomen soft, non-tender and normal bowel sounds Musculoskeletal:no cyanosis of digits and no clubbing  NEURO: alert & oriented x 3 with fluent speech, no focal motor/sensory deficits  LABORATORY DATA:  I have reviewed the data as listed    Component Value Date/Time   NA 133* 12/12/2013 1141   NA 135* 10/23/2013 0409   K 3.9 12/12/2013 1141   K 4.6 10/23/2013 0409   CL 99 10/23/2013 0409   CO2 22 12/12/2013 1141   CO2 26 10/23/2013 0409   GLUCOSE 194* 12/12/2013 1141   GLUCOSE 215* 10/23/2013 0409   BUN 16.3 12/12/2013 1141   BUN 16 10/23/2013 0409   CREATININE 1.0 12/12/2013 1141   CREATININE 0.74 10/23/2013 0409   CALCIUM 9.6 12/12/2013 1141   CALCIUM 9.0 10/23/2013 0409   PROT 7.4 12/12/2013 1141   PROT 7.0 10/15/2013 1316   ALBUMIN 3.3* 12/12/2013 1141   ALBUMIN 3.5 10/15/2013 1316   AST 11 12/12/2013 1141   AST 12 10/15/2013 1316   ALT 6 12/12/2013 1141   ALT 10  10/15/2013 1316   ALKPHOS 137 12/12/2013 1141   ALKPHOS 81 10/15/2013 1316   BILITOT 0.31 12/12/2013 1141   BILITOT 0.6 10/15/2013 1316   GFRNONAA 82* 10/23/2013 0409   GFRAA >90 10/23/2013 0409    No results found for this basename: SPEP,  UPEP,   kappa and lambda light chains    Lab Results  Component Value Date   WBC 7.9 12/12/2013   NEUTROABS 5.0 12/12/2013   HGB 10.9* 12/12/2013   HCT 33.3* 12/12/2013   MCV 77.8* 12/12/2013   PLT 215 12/12/2013      Chemistry      Component Value Date/Time   NA 133* 12/12/2013 1141   NA 135* 10/23/2013 0409   K 3.9 12/12/2013 1141   K 4.6 10/23/2013 0409   CL 99 10/23/2013 0409   CO2 22 12/12/2013 1141   CO2 26 10/23/2013 0409   BUN 16.3 12/12/2013 1141   BUN 16 10/23/2013 0409   CREATININE 1.0 12/12/2013 1141   CREATININE 0.74 10/23/2013 0409  Component Value Date/Time   CALCIUM 9.6 12/12/2013 1141   CALCIUM 9.0 10/23/2013 0409   ALKPHOS 137 12/12/2013 1141   ALKPHOS 81 10/15/2013 1316   AST 11 12/12/2013 1141   AST 12 10/15/2013 1316   ALT 6 12/12/2013 1141   ALT 10 10/15/2013 1316   BILITOT 0.31 12/12/2013 1141   BILITOT 0.6 10/15/2013 1316     ASSESSMENT & PLAN:  #1 stage IV metastatic kidney cancer Her performance status has improved since recent chemotherapy. She tolerated chemotherapy well. I will see her back in 2 weeks. I plan to repeat imaging study after 8 doses of chemotherapy. I will proceed with chemotherapy without dosage adjustment. #2 tobacco abuse I spent some time educating the patient importance of nicotine cessation #3 adrenal insufficiency Currently she is taking prednisone 5 mg every other day. I recommend she reduce it to 5 mg every third day. #4 weight loss This is due to untreated cancer. I recommend she increase oral intake. She is currently on prednisone #5 anemia This is likely anemia of chronic disease. The patient denies recent history of bleeding such as epistaxis, hematuria or hematochezia. She is asymptomatic from  the anemia. We will observe for now.  She does not require transfusion now.  #6 tachycardia I suspect this is metabolic related. I will check her thyroid function test with the next visit #7 cough This could be due to COPD. She has no evidence of leukocytosis. It could also be viral in nature. We will observe for now. #8 hyperglycemia This is related to her chemotherapy. I prescribed metformin to be taken, half a tablet daily and we'll recheck her blood work with the next visit. Orders Placed This Encounter  Procedures  . T4, free    Standing Status: Future     Number of Occurrences:      Standing Expiration Date: 12/12/2014  . TSH    Standing Status: Future     Number of Occurrences:      Standing Expiration Date: 12/12/2014   All questions were answered. The patient knows to call the clinic with any problems, questions or concerns. No barriers to learning was detected. I spent 40 minutes counseling the patient face to face. The total time spent in the appointment was 60 minutes and more than 50% was on counseling and review of test results     [facility], [place], MD 12/12/2013 12:57 PM

## 2013-12-15 ENCOUNTER — Telehealth: Payer: Self-pay | Admitting: *Deleted

## 2013-12-15 NOTE — Telephone Encounter (Signed)
Per staff message and POF I have scheduled appts.  JMW  

## 2013-12-15 NOTE — Telephone Encounter (Signed)
sw pt gv appt for 12/26/13 w/ labs@ 11:45am, ov@ 12:15pm and tx@ 12:30p. Pt is aware...td

## 2013-12-19 ENCOUNTER — Ambulatory Visit: Payer: Medicare HMO

## 2013-12-19 ENCOUNTER — Other Ambulatory Visit: Payer: Medicare HMO

## 2013-12-19 ENCOUNTER — Encounter (INDEPENDENT_AMBULATORY_CARE_PROVIDER_SITE_OTHER): Payer: Self-pay

## 2013-12-19 ENCOUNTER — Other Ambulatory Visit (HOSPITAL_BASED_OUTPATIENT_CLINIC_OR_DEPARTMENT_OTHER): Payer: Medicare HMO

## 2013-12-19 ENCOUNTER — Ambulatory Visit (HOSPITAL_BASED_OUTPATIENT_CLINIC_OR_DEPARTMENT_OTHER): Payer: Medicare HMO

## 2013-12-19 DIAGNOSIS — C787 Secondary malignant neoplasm of liver and intrahepatic bile duct: Secondary | ICD-10-CM

## 2013-12-19 DIAGNOSIS — E2749 Other adrenocortical insufficiency: Secondary | ICD-10-CM

## 2013-12-19 DIAGNOSIS — C649 Malignant neoplasm of unspecified kidney, except renal pelvis: Secondary | ICD-10-CM

## 2013-12-19 DIAGNOSIS — Z5111 Encounter for antineoplastic chemotherapy: Secondary | ICD-10-CM

## 2013-12-19 DIAGNOSIS — R630 Anorexia: Secondary | ICD-10-CM

## 2013-12-19 LAB — COMPREHENSIVE METABOLIC PANEL (CC13)
ALBUMIN: 3.4 g/dL — AB (ref 3.5–5.0)
ALT: 7 U/L (ref 0–55)
AST: 10 U/L (ref 5–34)
Alkaline Phosphatase: 129 U/L (ref 40–150)
Anion Gap: 11 mEq/L (ref 3–11)
BUN: 23.3 mg/dL (ref 7.0–26.0)
CALCIUM: 9.9 mg/dL (ref 8.4–10.4)
CHLORIDE: 102 meq/L (ref 98–109)
CO2: 21 mEq/L — ABNORMAL LOW (ref 22–29)
Creatinine: 1.1 mg/dL (ref 0.6–1.1)
Glucose: 237 mg/dl — ABNORMAL HIGH (ref 70–140)
POTASSIUM: 4.1 meq/L (ref 3.5–5.1)
Sodium: 134 mEq/L — ABNORMAL LOW (ref 136–145)
Total Bilirubin: 0.33 mg/dL (ref 0.20–1.20)
Total Protein: 7.7 g/dL (ref 6.4–8.3)

## 2013-12-19 LAB — CBC WITH DIFFERENTIAL/PLATELET
BASO%: 1.4 % (ref 0.0–2.0)
Basophils Absolute: 0.1 10*3/uL (ref 0.0–0.1)
EOS%: 1.1 % (ref 0.0–7.0)
Eosinophils Absolute: 0.1 10*3/uL (ref 0.0–0.5)
HCT: 32.6 % — ABNORMAL LOW (ref 34.8–46.6)
HEMOGLOBIN: 10.7 g/dL — AB (ref 11.6–15.9)
LYMPH#: 2.3 10*3/uL (ref 0.9–3.3)
LYMPH%: 27.3 % (ref 14.0–49.7)
MCH: 25.6 pg (ref 25.1–34.0)
MCHC: 32.8 g/dL (ref 31.5–36.0)
MCV: 78 fL — ABNORMAL LOW (ref 79.5–101.0)
MONO#: 0.6 10*3/uL (ref 0.1–0.9)
MONO%: 6.9 % (ref 0.0–14.0)
NEUT#: 5.4 10*3/uL (ref 1.5–6.5)
NEUT%: 63.3 % (ref 38.4–76.8)
Platelets: 200 10*3/uL (ref 145–400)
RBC: 4.17 10*6/uL (ref 3.70–5.45)
RDW: 15.5 % — AB (ref 11.2–14.5)
WBC: 8.5 10*3/uL (ref 3.9–10.3)

## 2013-12-19 LAB — T4, FREE: Free T4: 1.09 ng/dL (ref 0.80–1.80)

## 2013-12-19 LAB — TECHNOLOGIST REVIEW

## 2013-12-19 LAB — TSH CHCC: TSH: 0.959 m[IU]/L (ref 0.308–3.960)

## 2013-12-19 MED ORDER — SODIUM CHLORIDE 0.9 % IV SOLN
Freq: Once | INTRAVENOUS | Status: AC
  Administered 2013-12-19: 13:00:00 via INTRAVENOUS

## 2013-12-19 MED ORDER — ONDANSETRON 8 MG/NS 50 ML IVPB
INTRAVENOUS | Status: AC
  Filled 2013-12-19: qty 8

## 2013-12-19 MED ORDER — SODIUM CHLORIDE 0.9 % IJ SOLN
10.0000 mL | INTRAMUSCULAR | Status: DC | PRN
  Administered 2013-12-19: 10 mL
  Filled 2013-12-19: qty 10

## 2013-12-19 MED ORDER — HEPARIN SOD (PORK) LOCK FLUSH 100 UNIT/ML IV SOLN
500.0000 [IU] | Freq: Once | INTRAVENOUS | Status: AC | PRN
  Administered 2013-12-19: 500 [IU]
  Filled 2013-12-19: qty 5

## 2013-12-19 MED ORDER — TEMSIROLIMUS CHEMO INJECTION 25 MG/ML W/DILUENT
25.0000 mg | Freq: Once | INTRAVENOUS | Status: AC
  Administered 2013-12-19: 25 mg via INTRAVENOUS
  Filled 2013-12-19: qty 2.5

## 2013-12-19 MED ORDER — DIPHENHYDRAMINE HCL 50 MG/ML IJ SOLN
50.0000 mg | Freq: Once | INTRAMUSCULAR | Status: AC
  Administered 2013-12-19: 50 mg via INTRAVENOUS

## 2013-12-19 MED ORDER — ONDANSETRON 8 MG/50ML IVPB (CHCC)
8.0000 mg | Freq: Once | INTRAVENOUS | Status: AC
  Administered 2013-12-19: 8 mg via INTRAVENOUS

## 2013-12-19 MED ORDER — DIPHENHYDRAMINE HCL 50 MG/ML IJ SOLN
INTRAMUSCULAR | Status: AC
  Filled 2013-12-19: qty 1

## 2013-12-21 ENCOUNTER — Other Ambulatory Visit: Payer: Self-pay | Admitting: Hematology and Oncology

## 2013-12-22 ENCOUNTER — Ambulatory Visit (INDEPENDENT_AMBULATORY_CARE_PROVIDER_SITE_OTHER): Payer: Medicare HMO | Admitting: Surgery

## 2013-12-26 ENCOUNTER — Telehealth: Payer: Self-pay | Admitting: *Deleted

## 2013-12-26 ENCOUNTER — Ambulatory Visit (HOSPITAL_BASED_OUTPATIENT_CLINIC_OR_DEPARTMENT_OTHER): Payer: Medicare HMO | Admitting: Hematology and Oncology

## 2013-12-26 ENCOUNTER — Encounter: Payer: Self-pay | Admitting: Hematology and Oncology

## 2013-12-26 ENCOUNTER — Other Ambulatory Visit (HOSPITAL_BASED_OUTPATIENT_CLINIC_OR_DEPARTMENT_OTHER): Payer: Medicare HMO

## 2013-12-26 ENCOUNTER — Other Ambulatory Visit: Payer: Medicare HMO

## 2013-12-26 ENCOUNTER — Ambulatory Visit: Payer: Medicare HMO

## 2013-12-26 ENCOUNTER — Ambulatory Visit (HOSPITAL_BASED_OUTPATIENT_CLINIC_OR_DEPARTMENT_OTHER): Payer: Medicare HMO

## 2013-12-26 VITALS — BP 102/63 | HR 105 | Temp 97.5°F | Resp 18 | Ht 67.0 in | Wt 121.8 lb

## 2013-12-26 DIAGNOSIS — C649 Malignant neoplasm of unspecified kidney, except renal pelvis: Secondary | ICD-10-CM

## 2013-12-26 DIAGNOSIS — Z5111 Encounter for antineoplastic chemotherapy: Secondary | ICD-10-CM

## 2013-12-26 DIAGNOSIS — R7309 Other abnormal glucose: Secondary | ICD-10-CM

## 2013-12-26 DIAGNOSIS — R000 Tachycardia, unspecified: Secondary | ICD-10-CM

## 2013-12-26 DIAGNOSIS — D649 Anemia, unspecified: Secondary | ICD-10-CM

## 2013-12-26 DIAGNOSIS — E2749 Other adrenocortical insufficiency: Secondary | ICD-10-CM

## 2013-12-26 DIAGNOSIS — F17200 Nicotine dependence, unspecified, uncomplicated: Secondary | ICD-10-CM

## 2013-12-26 DIAGNOSIS — R634 Abnormal weight loss: Secondary | ICD-10-CM

## 2013-12-26 DIAGNOSIS — E119 Type 2 diabetes mellitus without complications: Secondary | ICD-10-CM

## 2013-12-26 LAB — CBC WITH DIFFERENTIAL/PLATELET
BASO%: 0.4 % (ref 0.0–2.0)
Basophils Absolute: 0 10*3/uL (ref 0.0–0.1)
EOS%: 1.9 % (ref 0.0–7.0)
Eosinophils Absolute: 0.1 10*3/uL (ref 0.0–0.5)
HCT: 29.8 % — ABNORMAL LOW (ref 34.8–46.6)
HGB: 9.9 g/dL — ABNORMAL LOW (ref 11.6–15.9)
LYMPH%: 28.6 % (ref 14.0–49.7)
MCH: 25.3 pg (ref 25.1–34.0)
MCHC: 33.2 g/dL (ref 31.5–36.0)
MCV: 76 fL — ABNORMAL LOW (ref 79.5–101.0)
MONO#: 0.4 10*3/uL (ref 0.1–0.9)
MONO%: 5.9 % (ref 0.0–14.0)
NEUT#: 4.3 10*3/uL (ref 1.5–6.5)
NEUT%: 63.2 % (ref 38.4–76.8)
Platelets: 173 10*3/uL (ref 145–400)
RBC: 3.92 10*6/uL (ref 3.70–5.45)
RDW: 15 % — ABNORMAL HIGH (ref 11.2–14.5)
WBC: 6.8 10*3/uL (ref 3.9–10.3)
lymph#: 2 10*3/uL (ref 0.9–3.3)

## 2013-12-26 LAB — COMPREHENSIVE METABOLIC PANEL (CC13)
ALBUMIN: 3.1 g/dL — AB (ref 3.5–5.0)
ALT: 8 U/L (ref 0–55)
ANION GAP: 10 meq/L (ref 3–11)
AST: 11 U/L (ref 5–34)
Alkaline Phosphatase: 130 U/L (ref 40–150)
BUN: 16.7 mg/dL (ref 7.0–26.0)
CALCIUM: 9.6 mg/dL (ref 8.4–10.4)
CO2: 21 meq/L — AB (ref 22–29)
Chloride: 105 mEq/L (ref 98–109)
Creatinine: 1.1 mg/dL (ref 0.6–1.1)
GLUCOSE: 229 mg/dL — AB (ref 70–140)
POTASSIUM: 3.9 meq/L (ref 3.5–5.1)
Sodium: 137 mEq/L (ref 136–145)
Total Bilirubin: 0.29 mg/dL (ref 0.20–1.20)
Total Protein: 7.1 g/dL (ref 6.4–8.3)

## 2013-12-26 MED ORDER — ONDANSETRON 8 MG/50ML IVPB (CHCC)
8.0000 mg | Freq: Once | INTRAVENOUS | Status: AC
  Administered 2013-12-26: 8 mg via INTRAVENOUS

## 2013-12-26 MED ORDER — SODIUM CHLORIDE 0.9 % IV SOLN
Freq: Once | INTRAVENOUS | Status: AC
  Administered 2013-12-26: 13:00:00 via INTRAVENOUS

## 2013-12-26 MED ORDER — DIPHENHYDRAMINE HCL 50 MG/ML IJ SOLN
50.0000 mg | Freq: Once | INTRAMUSCULAR | Status: AC
  Administered 2013-12-26: 50 mg via INTRAVENOUS

## 2013-12-26 MED ORDER — SODIUM CHLORIDE 0.9 % IJ SOLN
10.0000 mL | INTRAMUSCULAR | Status: DC | PRN
  Administered 2013-12-26: 10 mL
  Filled 2013-12-26: qty 10

## 2013-12-26 MED ORDER — DIPHENHYDRAMINE HCL 50 MG/ML IJ SOLN
INTRAMUSCULAR | Status: AC
  Filled 2013-12-26: qty 1

## 2013-12-26 MED ORDER — ONDANSETRON 8 MG/NS 50 ML IVPB
INTRAVENOUS | Status: AC
  Filled 2013-12-26: qty 8

## 2013-12-26 MED ORDER — HEPARIN SOD (PORK) LOCK FLUSH 100 UNIT/ML IV SOLN
500.0000 [IU] | Freq: Once | INTRAVENOUS | Status: AC | PRN
  Administered 2013-12-26: 500 [IU]
  Filled 2013-12-26: qty 5

## 2013-12-26 MED ORDER — TEMSIROLIMUS CHEMO INJECTION 25 MG/ML W/DILUENT
25.0000 mg | Freq: Once | INTRAVENOUS | Status: AC
  Administered 2013-12-26: 25 mg via INTRAVENOUS
  Filled 2013-12-26: qty 2.5

## 2013-12-26 NOTE — Patient Instructions (Signed)
[place] Discharge Instructions for Patients Receiving Chemotherapy  Today you received the following chemotherapy agents Torisel To help prevent nausea and vomiting after your treatment, we encourage you to take your nausea medication as prescribed.  If you develop nausea and vomiting that is not controlled by your nausea medication, call the clinic.   BELOW ARE SYMPTOMS THAT SHOULD BE REPORTED IMMEDIATELY:  *FEVER GREATER THAN 100.5 F  *CHILLS WITH OR WITHOUT FEVER  NAUSEA AND VOMITING THAT IS NOT CONTROLLED WITH YOUR NAUSEA MEDICATION  *UNUSUAL SHORTNESS OF BREATH  *UNUSUAL BRUISING OR BLEEDING  TENDERNESS IN MOUTH AND THROAT WITH OR WITHOUT PRESENCE OF ULCERS  *URINARY PROBLEMS  *BOWEL PROBLEMS  UNUSUAL RASH Items with * indicate a potential emergency and should be followed up as soon as possible.  Feel free to call the clinic you have any questions or concerns. The clinic phone number is [phone].

## 2013-12-26 NOTE — Telephone Encounter (Signed)
appts made and printed. Pt is aware that cs will call w/ appts for CT abd/pelvis/ chest...td

## 2013-12-26 NOTE — Progress Notes (Signed)
[place] OFFICE PROGRESS NOTE  Patient Care Team: [name], MD as PCP - General (Internal Medicine) Heath Lark, MD as Consulting Physician (Hematology and Oncology)  DIAGNOSIS: Metastatic renal cell carcinoma, ongoing palliative chemotherapy  SUMMARY OF ONCOLOGIC HISTORY: Oncology History   Renal cell carcinoma   Primary site: Kidney (Left)   Staging method: AJCC 7th Edition   Clinical: Stage IV (T3a, N0, M1) signed by Heath Lark, MD on 10/21/2013  8:46 AM   Pathologic: Stage IV (T3a, N0, M1) signed by Heath Lark, MD on 10/21/2013  8:47 AM   Summary: Stage IV (T3a, N0, M1)       Renal cell carcinoma   10/30/2010 Imaging Negative CT abdomen   09/15/2013 Surgery Patient underwent left nephrectomy which confirmed RCC, Fuhrman grade III/IV   09/27/2013 Imaging CT abdomen showed liver metastasis   09/29/2013 Procedure CT guided liver biopsy confrimed metastatic disease   09/29/2013 Imaging CT chest showed enlarging pulmonary metastases   09/30/2013 - 10/10/2013 Hospital Admission Admitted for orthostatic hypotension and headaches and severe malnutrition   10/21/2013 - 10/23/2013 Hospital Admission She was admitted to the hospital with recurrent urinary tract infection, dehydration, severe headaches and adrenal insufficiency.   11/07/2013 -  Chemotherapy The patient begun palliative chemotherapy with weekly Temsirolimus    INTERVAL HISTORY: Kaylee [name] 73 y.o. female returns for further followup. She complained of some dizziness, fatigue and loss of appetite. She denies any recent headaches. Denies any side effects of chemotherapy such as nausea, vomiting or diarrhea. She denies any recent fever, chills, night sweats or abnormal weight loss  I have reviewed the past medical history, past surgical history, social history and family history with the patient and they are unchanged from previous note.  ALLERGIES:  is allergic to penicillins and  codeine.  MEDICATIONS:  Current Outpatient Prescriptions  Medication Sig Dispense Refill  . ALPRAZolam (XANAX) 0.25 MG tablet Take 1 tablet (0.25 mg total) by mouth 2 (two) times daily as needed for anxiety or sleep.  30 tablet  0  . lidocaine-prilocaine (EMLA) cream Apply 1 application topically as needed (port).      . metFORMIN (GLUCOPHAGE) 500 MG tablet Take 1 tablet (500 mg total) by mouth daily with breakfast.  30 tablet  1  . omeprazole (PRILOSEC) 40 MG capsule Take 40 mg by mouth daily.      . Oxycodone HCl 10 MG TABS Take 10 mg by mouth every 3 (three) hours as needed (pain).      Marland Kitchen PARoxetine (PAXIL) 20 MG tablet Take 20 mg by mouth every evening.      . polyethylene glycol (MIRALAX / GLYCOLAX) packet Take 17 g by mouth daily.      . predniSONE (DELTASONE) 10 MG tablet Take 5 mg by mouth 2 (two) times a week. Tues and Thursday      . promethazine (PHENERGAN) 25 MG tablet Take 25 mg by mouth every 6 (six) hours as needed for nausea.      . butalbital-acetaminophen-caffeine (FIORICET, ESGIC) 50-325-40 MG per tablet Take 1 tablet by mouth every 6 (six) hours as needed for headache.       No current facility-administered medications for this visit.   Facility-Administered Medications Ordered in Other Visits  Medication Dose Route Frequency Provider Last Rate Last Dose  . sodium chloride 0.9 % injection 10 mL  10 mL Intracatheter PRN Heath Lark, MD   10 mL at 12/26/13 1358    REVIEW OF SYSTEMS:  Constitutional: Denies fevers, chills or abnormal weight loss Eyes: Denies blurriness of vision Ears, nose, mouth, throat, and face: Denies mucositis or sore throat Respiratory: Denies cough, dyspnea or wheezes Cardiovascular: Denies palpitation, chest discomfort or lower extremity swelling Gastrointestinal:  Denies nausea, heartburn or change in bowel habits Skin: Denies abnormal skin rashes Lymphatics: Denies new lymphadenopathy or easy bruising Neurological:Denies numbness, tingling  or new weaknesses Behavioral/Psych: Mood is stable, no new changes  All other systems were reviewed with the patient and are negative.  PHYSICAL EXAMINATION: ECOG PERFORMANCE STATUS: 1 - Symptomatic but completely ambulatory  Filed Vitals:   12/26/13 1209  BP: 102/63  Pulse: 105  Temp: 97.5 F (36.4 C)  Resp: 18   Filed Weights   12/26/13 1209  Weight: 121 lb 12.8 oz (55.248 kg)    GENERAL:alert, no distress and comfortable. She looks thin and mildly cachectic SKIN: skin color, texture, turgor are normal, no rashes or significant lesions EYES: normal, Conjunctiva are pink and non-injected, sclera clear OROPHARYNX:no exudate, no erythema and lips, buccal mucosa, and tongue normal  NECK: supple, thyroid normal size, non-tender, without nodularity LYMPH:  no palpable lymphadenopathy in the cervical, axillary or inguinal LUNGS: clear to auscultation and percussion with normal breathing effort HEART: regular rate & rhythm and no murmurs and no lower extremity edema ABDOMEN:abdomen soft, non-tender and normal bowel sounds Musculoskeletal:no cyanosis of digits and no clubbing  NEURO: alert & oriented x 3 with fluent speech, no focal motor/sensory deficits  LABORATORY DATA:  I have reviewed the data as listed    Component Value Date/Time   NA 134* 12/19/2013 1130   NA 135* 10/23/2013 0409   K 4.1 12/19/2013 1130   K 4.6 10/23/2013 0409   CL 99 10/23/2013 0409   CO2 21* 12/19/2013 1130   CO2 26 10/23/2013 0409   GLUCOSE 237* 12/19/2013 1130   GLUCOSE 215* 10/23/2013 0409   BUN 23.3 12/19/2013 1130   BUN 16 10/23/2013 0409   CREATININE 1.1 12/19/2013 1130   CREATININE 0.74 10/23/2013 0409   CALCIUM 9.9 12/19/2013 1130   CALCIUM 9.0 10/23/2013 0409   PROT 7.7 12/19/2013 1130   PROT 7.0 10/15/2013 1316   ALBUMIN 3.4* 12/19/2013 1130   ALBUMIN 3.5 10/15/2013 1316   AST 10 12/19/2013 1130   AST 12 10/15/2013 1316   ALT 7 12/19/2013 1130   ALT 10 10/15/2013 1316   ALKPHOS 129 12/19/2013 1130    ALKPHOS 81 10/15/2013 1316   BILITOT 0.33 12/19/2013 1130   BILITOT 0.6 10/15/2013 1316   GFRNONAA 82* 10/23/2013 0409   GFRAA >90 10/23/2013 0409    No results found for this basename: SPEP,  UPEP,   kappa and lambda light chains    Lab Results  Component Value Date   WBC 6.8 12/26/2013   NEUTROABS 4.3 12/26/2013   HGB 9.9* 12/26/2013   HCT 29.8* 12/26/2013   MCV 76.0* 12/26/2013   PLT 173 12/26/2013      Chemistry      Component Value Date/Time   NA 134* 12/19/2013 1130   NA 135* 10/23/2013 0409   K 4.1 12/19/2013 1130   K 4.6 10/23/2013 0409   CL 99 10/23/2013 0409   CO2 21* 12/19/2013 1130   CO2 26 10/23/2013 0409   BUN 23.3 12/19/2013 1130   BUN 16 10/23/2013 0409   CREATININE 1.1 12/19/2013 1130   CREATININE 0.74 10/23/2013 0409      Component Value Date/Time   CALCIUM 9.9 12/19/2013 1130  CALCIUM 9.0 10/23/2013 0409   ALKPHOS 129 12/19/2013 1130   ALKPHOS 81 10/15/2013 1316   AST 10 12/19/2013 1130   AST 12 10/15/2013 1316   ALT 7 12/19/2013 1130   ALT 10 10/15/2013 1316   BILITOT 0.33 12/19/2013 1130   BILITOT 0.6 10/15/2013 1316     ASSESSMENT & PLAN:  #1 stage IV metastatic kidney cancer Her performance status has improved since recent chemotherapy. She tolerated chemotherapy well. I will see her back next week. I plan to repeat imaging study to assess response her treatment. I will proceed with chemotherapy without dosage adjustment. #2 tobacco abuse I spent some time educating the patient importance of nicotine cessation #3 adrenal insufficiency Currently she is taking prednisone 5 mg every other day and appear to have signs and symptoms of steroid withdrawal. I told her to increase her prednisone to 5 mg, 4 times a week. #4 weight loss This is due to untreated cancer. I recommend she increase oral intake. She is currently on prednisone #5 anemia This is likely anemia of chronic disease. The patient denies recent history of bleeding such as epistaxis, hematuria or hematochezia. She is  asymptomatic from the anemia. We will observe for now.  She does not require transfusion now.  #6 tachycardia I suspect this is metabolic related. As mentioned above, I told her to increase taking prednisone more regularly. #7 hyperglycemia  This is related to her chemotherapy and prednisone. I prescribed metformin to be taken, half a tablet daily and we'll recheck her blood work with the next visit with hemoglobin A1c.  Orders Placed This Encounter  Procedures  . CT Chest W Contrast    Standing Status: Future     Number of Occurrences:      Standing Expiration Date: 02/25/2015    Order Specific Question:  Reason for Exam (SYMPTOM  OR DIAGNOSIS REQUIRED)    Answer:  metastatic kidney ca, assess response to Rx    Order Specific Question:  Preferred imaging location?    Answer:  [facility]  . CT Abdomen Pelvis W Contrast    Standing Status: Future     Number of Occurrences:      Standing Expiration Date: 03/28/2015    Order Specific Question:  Reason for Exam (SYMPTOM  OR DIAGNOSIS REQUIRED)    Answer:  metastatic kidney ca, assess response to Rx    Order Specific Question:  Preferred imaging location?    Answer:  [facility]  . Hemoglobin A1c    Standing Status: Future     Number of Occurrences:      Standing Expiration Date: 12/26/2014   All questions were answered. The patient knows to call the clinic with any problems, questions or concerns. No barriers to learning was detected. I spent 40 minutes counseling the patient face to face. The total time spent in the appointment was 55 minutes and more than 50% was on counseling and review of test results     [facility], [place], MD 12/26/2013 2:40 PM

## 2014-01-01 ENCOUNTER — Ambulatory Visit (HOSPITAL_COMMUNITY)
Admission: RE | Admit: 2014-01-01 | Discharge: 2014-01-01 | Disposition: A | Discharge status: Home / Self Care | Payer: Medicare HMO | Source: Ambulatory Visit | Attending: Hematology and Oncology | Admitting: Hematology and Oncology

## 2014-01-01 ENCOUNTER — Encounter (HOSPITAL_COMMUNITY): Payer: Self-pay

## 2014-01-01 ENCOUNTER — Other Ambulatory Visit: Payer: Self-pay | Admitting: Hematology and Oncology

## 2014-01-01 DIAGNOSIS — C649 Malignant neoplasm of unspecified kidney, except renal pelvis: Secondary | ICD-10-CM

## 2014-01-01 DIAGNOSIS — C7800 Secondary malignant neoplasm of unspecified lung: Secondary | ICD-10-CM | POA: Insufficient documentation

## 2014-01-01 DIAGNOSIS — J449 Chronic obstructive pulmonary disease, unspecified: Secondary | ICD-10-CM | POA: Insufficient documentation

## 2014-01-01 DIAGNOSIS — J4489 Other specified chronic obstructive pulmonary disease: Secondary | ICD-10-CM | POA: Insufficient documentation

## 2014-01-01 DIAGNOSIS — C787 Secondary malignant neoplasm of liver and intrahepatic bile duct: Secondary | ICD-10-CM | POA: Insufficient documentation

## 2014-01-01 DIAGNOSIS — M439 Deforming dorsopathy, unspecified: Secondary | ICD-10-CM | POA: Insufficient documentation

## 2014-01-01 DIAGNOSIS — M549 Dorsalgia, unspecified: Secondary | ICD-10-CM | POA: Insufficient documentation

## 2014-01-01 HISTORY — DX: Type 2 diabetes mellitus without complications: E11.9

## 2014-01-01 MED ORDER — IOHEXOL 300 MG/ML  SOLN
100.0000 mL | Freq: Once | INTRAMUSCULAR | Status: AC | PRN
  Administered 2014-01-01: 100 mL via INTRAVENOUS

## 2014-01-02 ENCOUNTER — Other Ambulatory Visit: Payer: Medicare HMO

## 2014-01-02 ENCOUNTER — Ambulatory Visit (HOSPITAL_BASED_OUTPATIENT_CLINIC_OR_DEPARTMENT_OTHER): Payer: Medicare HMO | Admitting: Hematology and Oncology

## 2014-01-02 ENCOUNTER — Encounter: Payer: Self-pay | Admitting: Hematology and Oncology

## 2014-01-02 ENCOUNTER — Telehealth: Payer: Self-pay | Admitting: *Deleted

## 2014-01-02 ENCOUNTER — Other Ambulatory Visit (HOSPITAL_BASED_OUTPATIENT_CLINIC_OR_DEPARTMENT_OTHER): Payer: Medicare HMO

## 2014-01-02 ENCOUNTER — Ambulatory Visit: Payer: Medicare HMO

## 2014-01-02 ENCOUNTER — Other Ambulatory Visit: Payer: Self-pay | Admitting: Hematology and Oncology

## 2014-01-02 ENCOUNTER — Encounter: Payer: Self-pay | Admitting: *Deleted

## 2014-01-02 VITALS — BP 120/65 | HR 98 | Temp 97.7°F | Resp 18 | Ht 67.0 in | Wt 120.1 lb

## 2014-01-02 DIAGNOSIS — R5381 Other malaise: Secondary | ICD-10-CM

## 2014-01-02 DIAGNOSIS — R000 Tachycardia, unspecified: Secondary | ICD-10-CM

## 2014-01-02 DIAGNOSIS — C649 Malignant neoplasm of unspecified kidney, except renal pelvis: Secondary | ICD-10-CM

## 2014-01-02 DIAGNOSIS — C7800 Secondary malignant neoplasm of unspecified lung: Secondary | ICD-10-CM

## 2014-01-02 DIAGNOSIS — E119 Type 2 diabetes mellitus without complications: Secondary | ICD-10-CM

## 2014-01-02 DIAGNOSIS — C787 Secondary malignant neoplasm of liver and intrahepatic bile duct: Secondary | ICD-10-CM

## 2014-01-02 DIAGNOSIS — E2749 Other adrenocortical insufficiency: Secondary | ICD-10-CM

## 2014-01-02 DIAGNOSIS — D649 Anemia, unspecified: Secondary | ICD-10-CM

## 2014-01-02 DIAGNOSIS — F17200 Nicotine dependence, unspecified, uncomplicated: Secondary | ICD-10-CM

## 2014-01-02 DIAGNOSIS — R5383 Other fatigue: Secondary | ICD-10-CM

## 2014-01-02 LAB — CBC WITH DIFFERENTIAL/PLATELET
BASO%: 1.2 % (ref 0.0–2.0)
BASOS ABS: 0.1 10*3/uL (ref 0.0–0.1)
EOS%: 1.3 % (ref 0.0–7.0)
Eosinophils Absolute: 0.1 10*3/uL (ref 0.0–0.5)
HEMATOCRIT: 31.3 % — AB (ref 34.8–46.6)
HEMOGLOBIN: 10.3 g/dL — AB (ref 11.6–15.9)
LYMPH#: 2.1 10*3/uL (ref 0.9–3.3)
LYMPH%: 27.1 % (ref 14.0–49.7)
MCH: 25.2 pg (ref 25.1–34.0)
MCHC: 33 g/dL (ref 31.5–36.0)
MCV: 76.4 fL — ABNORMAL LOW (ref 79.5–101.0)
MONO#: 0.6 10*3/uL (ref 0.1–0.9)
MONO%: 7.5 % (ref 0.0–14.0)
NEUT%: 62.9 % (ref 38.4–76.8)
NEUTROS ABS: 4.9 10*3/uL (ref 1.5–6.5)
Platelets: 210 10*3/uL (ref 145–400)
RBC: 4.1 10*6/uL (ref 3.70–5.45)
RDW: 15.5 % — ABNORMAL HIGH (ref 11.2–14.5)
WBC: 7.8 10*3/uL (ref 3.9–10.3)

## 2014-01-02 LAB — HEMOGLOBIN A1C
Hgb A1c MFr Bld: 9.9 % — ABNORMAL HIGH (ref <5.7)
Mean Plasma Glucose: 237 mg/dL — ABNORMAL HIGH (ref <117)

## 2014-01-02 LAB — COMPREHENSIVE METABOLIC PANEL (CC13)
ALT: <6 U/L (ref 0–55)
AST: 8 U/L (ref 5–34)
Albumin: 3.3 g/dL — ABNORMAL LOW (ref 3.5–5.0)
Alkaline Phosphatase: 139 U/L (ref 40–150)
Anion Gap: 10 mEq/L (ref 3–11)
BILIRUBIN TOTAL: 0.31 mg/dL (ref 0.20–1.20)
BUN: 16.4 mg/dL (ref 7.0–26.0)
CALCIUM: 9.8 mg/dL (ref 8.4–10.4)
CHLORIDE: 101 meq/L (ref 98–109)
CO2: 21 meq/L — AB (ref 22–29)
Creatinine: 1.4 mg/dL — ABNORMAL HIGH (ref 0.6–1.1)
GLUCOSE: 393 mg/dL — AB (ref 70–140)
Potassium: 4.3 mEq/L (ref 3.5–5.1)
Sodium: 133 mEq/L — ABNORMAL LOW (ref 136–145)
Total Protein: 7.7 g/dL (ref 6.4–8.3)

## 2014-01-02 MED ORDER — SUNITINIB MALATE 50 MG PO CAPS: 50.0000 mg | ORAL_CAPSULE | Freq: Every day | ORAL | Status: DC

## 2014-01-02 NOTE — Progress Notes (Signed)
Faxed xeloda prescription to [facility]

## 2014-01-02 NOTE — Progress Notes (Signed)
[facility] OFFICE PROGRESS NOTE  Patient Care Team: [name], MD as PCP - General (Internal Medicine) Heath Lark, MD as Consulting Physician (Hematology and Oncology)  DIAGNOSIS: Metastatic renal cell carcinoma  SUMMARY OF ONCOLOGIC HISTORY: Oncology History   Renal cell carcinoma   Primary site: Kidney (Left)   Staging method: AJCC 7th Edition   Clinical: Stage IV (T3a, N0, M1) signed by Heath Lark, MD on 10/21/2013  8:46 AM   Pathologic: Stage IV (T3a, N0, M1) signed by Heath Lark, MD on 10/21/2013  8:47 AM   Summary: Stage IV (T3a, N0, M1)       Renal cell carcinoma   10/30/2010 Imaging Negative CT abdomen   09/15/2013 Surgery Patient underwent left nephrectomy which confirmed RCC, Fuhrman grade III/IV   09/27/2013 Imaging CT abdomen showed liver metastasis   09/29/2013 Procedure CT guided liver biopsy confrimed metastatic disease   09/29/2013 Imaging CT chest showed enlarging pulmonary metastases   09/30/2013 - 10/10/2013 Hospital Admission Admitted for orthostatic hypotension and headaches and severe malnutrition   10/21/2013 - 10/23/2013 Hospital Admission She was admitted to the hospital with recurrent urinary tract infection, dehydration, severe headaches and adrenal insufficiency.   11/07/2013 - 12/26/2013 Chemotherapy The patient begun palliative chemotherapy with weekly Temsirolimus   01/01/2014 Imaging Repeat CT scan show mixed response to treatment.    INTERVAL HISTORY: Kaylee [name] 73 y.o. female returns for further followup. She complained of profound fatigue. She took her blood pressure measurement over the last week which show only mild tachycardia but her blood pressure within normal limits. Recently, she discontinue metformin due to CT scan. She has lost some weight. She is to smoking and am willing to quit right now. The patient denies any mouth sores, nausea, vomiting or change in bowel habits She denies any recent fever, chills, night  sweats or cough  I have reviewed the past medical history, past surgical history, social history and family history with the patient and they are unchanged from previous note.  ALLERGIES:  is allergic to penicillins and codeine.  MEDICATIONS:  Current Outpatient Prescriptions  Medication Sig Dispense Refill  . ALPRAZolam (XANAX) 0.25 MG tablet Take 1 tablet (0.25 mg total) by mouth 2 (two) times daily as needed for anxiety or sleep.  30 tablet  0  . lidocaine-prilocaine (EMLA) cream Apply 1 application topically as needed (port).      . metFORMIN (GLUCOPHAGE) 500 MG tablet Take 1 tablet (500 mg total) by mouth daily with breakfast.  30 tablet  1  . omeprazole (PRILOSEC) 40 MG capsule Take 40 mg by mouth daily.      . Oxycodone HCl 10 MG TABS Take 10 mg by mouth every 3 (three) hours as needed (pain).      Marland Kitchen PARoxetine (PAXIL) 20 MG tablet Take 20 mg by mouth every evening.      . polyethylene glycol (MIRALAX / GLYCOLAX) packet Take 17 g by mouth daily.      . predniSONE (DELTASONE) 10 MG tablet Take 5 mg by mouth 2 (two) times a week. Tues and Thursday      . promethazine (PHENERGAN) 25 MG tablet Take 25 mg by mouth every 6 (six) hours as needed for nausea.      . SUNItinib (SUTENT) 50 MG capsule Take 1 capsule (50 mg total) by mouth daily.  42 capsule  0   No current facility-administered medications for this visit.    REVIEW OF SYSTEMS:   Eyes: Denies  blurriness of vision Ears, nose, mouth, throat, and face: Denies mucositis or sore throat Respiratory: Denies cough, dyspnea or wheezes Cardiovascular: Denies palpitation, chest discomfort or lower extremity swelling Gastrointestinal:  Denies nausea, heartburn or change in bowel habits Skin: Denies abnormal skin rashes Lymphatics: Denies new lymphadenopathy or easy bruising Neurological:Denies numbness, tingling or new weaknesses Behavioral/Psych: Mood is stable, no new changes  All other systems were reviewed with the patient and are  negative.  PHYSICAL EXAMINATION: ECOG PERFORMANCE STATUS: 1 - Symptomatic but completely ambulatory  Filed Vitals:   01/02/14 1105  BP: 120/65  Pulse: 98  Temp: 97.7 F (36.5 C)  Resp: 18   Filed Weights   01/02/14 1105  Weight: 120 lb 1.6 oz (54.477 kg)    GENERAL:alert, no distress and comfortable. She looks thin and mildly cachectic SKIN: skin color, texture, turgor are normal, no rashes or significant lesions EYES: normal, Conjunctiva are pink and non-injected, sclera clear NEURO: alert & oriented x 3 with fluent speech, no focal motor/sensory deficits  LABORATORY DATA:  I have reviewed the data as listed    Component Value Date/Time   NA 133* 01/02/2014 1052   NA 135* 10/23/2013 0409   K 4.3 01/02/2014 1052   K 4.6 10/23/2013 0409   CL 99 10/23/2013 0409   CO2 21* 01/02/2014 1052   CO2 26 10/23/2013 0409   GLUCOSE 393* 01/02/2014 1052   GLUCOSE 215* 10/23/2013 0409   BUN 16.4 01/02/2014 1052   BUN 16 10/23/2013 0409   CREATININE 1.4* 01/02/2014 1052   CREATININE 0.74 10/23/2013 0409   CALCIUM 9.8 01/02/2014 1052   CALCIUM 9.0 10/23/2013 0409   PROT 7.7 01/02/2014 1052   PROT 7.0 10/15/2013 1316   ALBUMIN 3.3* 01/02/2014 1052   ALBUMIN 3.5 10/15/2013 1316   AST 8 01/02/2014 1052   AST 12 10/15/2013 1316   ALT <6 01/02/2014 1052   ALT 10 10/15/2013 1316   ALKPHOS 139 01/02/2014 1052   ALKPHOS 81 10/15/2013 1316   BILITOT 0.31 01/02/2014 1052   BILITOT 0.6 10/15/2013 1316   GFRNONAA 82* 10/23/2013 0409   GFRAA >90 10/23/2013 0409    No results found for this basename: SPEP,  UPEP,   kappa and lambda light chains    Lab Results  Component Value Date   WBC 7.8 01/02/2014   NEUTROABS 4.9 01/02/2014   HGB 10.3* 01/02/2014   HCT 31.3* 01/02/2014   MCV 76.4* 01/02/2014   PLT 210 01/02/2014      Chemistry      Component Value Date/Time   NA 133* 01/02/2014 1052   NA 135* 10/23/2013 0409   K 4.3 01/02/2014 1052   K 4.6 10/23/2013 0409   CL 99 10/23/2013 0409   CO2 21* 01/02/2014 1052   CO2  26 10/23/2013 0409   BUN 16.4 01/02/2014 1052   BUN 16 10/23/2013 0409   CREATININE 1.4* 01/02/2014 1052   CREATININE 0.74 10/23/2013 0409      Component Value Date/Time   CALCIUM 9.8 01/02/2014 1052   CALCIUM 9.0 10/23/2013 0409   ALKPHOS 139 01/02/2014 1052   ALKPHOS 81 10/15/2013 1316   AST 8 01/02/2014 1052   AST 12 10/15/2013 1316   ALT <6 01/02/2014 1052   ALT 10 10/15/2013 1316   BILITOT 0.31 01/02/2014 1052   BILITOT 0.6 10/15/2013 1316       RADIOGRAPHIC STUDIES: I have personally reviewed the radiological images as listed and agreed with the findings in the report. Ct Abdomen  Pelvis W Wo Contrast  01/01/2014   CLINICAL DATA:  Metastatic renal cell carcinoma.  COPD.  Back pain.  EXAM: CT CHEST, ABDOMEN, AND PELVIS WITH CONTRAST  TECHNIQUE: Multidetector CT imaging of the chest, abdomen and pelvis was performed following the standard protocol during bolus administration of intravenous contrast.  CONTRAST:  156mL OMNIPAQUE IOHEXOL 300 MG/ML  SOLN  COMPARISON:  09/29/2013 chest CT and 09/27/2013 abdominal pelvic CT.  FINDINGS: CT CHEST FINDINGS  Lungs/Pleura: Surgical changes of right upper lobectomy. Moderate centrilobular emphysema.  Multiple bilateral pulmonary nodules.  - left upper lobe at 1.4 by 1.1 cm on image 27 versus 1.2 x 1.0 cm on the prior exam.  Right middle lobe at 1.1 cm on image 34 versus 9 mm on the prior.  Left lower lobe 1.2 cm nodule on image 44 has enlarged from 9 mm on the prior.  Right lower lobe nodule which measures 1.6 cm on image 47 has enlarged from 9 mm on the prior.  No airspace opacities.  Paraseptal emphysema as well.  No pleural fluid.  Heart/Mediastinum: A right-sided Port-A-Cath which terminates at the low SVC. No supraclavicular adenopathy. Bilateral breast implants. Extensive atherosclerotic irregularity within the aorta. Upper normal heart size without pericardial effusion. Multivessel coronary artery atherosclerosis. No central pulmonary embolism, on this  non-dedicated study. Small middle mediastinal nodes which are unchanged. No mediastinal or hilar adenopathy.  CT ABDOMEN AND PELVIS FINDINGS  Abdomen/Pelvis: No right-sided renal calculi or other calcified lesions on unenhanced images.  Hyperenhancing hepatic dome lesion of 8 mm on image 13/ series 4, similar to on the prior.  Posterior segment right liver lobe hyperenhancing 1.0 cm lesion image 41/series 4. Increased from 08/14/2013 MRI. Not readily apparent on prior CT, which was not performed with arterial phase imaging.  Lateral hepatic dome hyperenhancing focus on image 15/series 4 is tiny but may be new since the prior MRI.  Anterior left hepatic lobe 1.6 cm hyperenhancing lesion on image 26/series 4 is decreased in size from 2.7 cm on the 09/27/2013 exam.  Normal spleen, stomach, pancreas, gallbladder, biliary tract, right adrenal gland. The left adrenal is not well visualized and may be surgically absent. Status post left nephrectomy, without locally recurrent disease. There is mild fascia thickening along the left diaphragmatic crura, including on image 93/series 6 which is unchanged.  No right renal lesion.  Aortic and branch vessel atherosclerosis. Non aneurysmal dilatation of the infrarenal aorta as unchanged. No retroperitoneal or retrocrural adenopathy. Colonic stool burden suggests constipation. Scattered colonic diverticula. Normal terminal ileum and appendix. Normal small bowel without abdominal ascites. No pelvic adenopathy. Pelvic floor laxity. Otherwise normal urinary bladder, without adnexal mass or significant free pelvic fluid.  Bones/Musculoskeletal: Moderate osteopenia. Left iliac bone island. Mild compression deformities at T3 and T4, new since 09/29/2013.  IMPRESSION: CT CHEST IMPRESSION  1. Moderate progression of pulmonary metastasis since 09/29/2013. 2. New T3 and T4 compression deformities since 09/29/2013.  CT ABDOMEN AND PELVIS IMPRESSION  1. Status post right nephrectomy. Mixed  response to hepatic metastasis. The largest lesion is slightly smaller than the 09/27/2013 CT. Other lesions appear new and enlarged when directly compared to 08/14/2013 MRI. Of note, prior CT did not include arterial phase imaging, complicating direct comparison. 2. Mild pelvic floor laxity. 3.  Possible constipation.   Electronically Signed   By: Abigail Miyamoto M.D.   On: 01/01/2014 12:30   Ct Chest W Contrast  01/01/2014   CLINICAL DATA:  Metastatic renal cell carcinoma.  COPD.  Back pain.  EXAM: CT CHEST, ABDOMEN, AND PELVIS WITH CONTRAST  TECHNIQUE: Multidetector CT imaging of the chest, abdomen and pelvis was performed following the standard protocol during bolus administration of intravenous contrast.  CONTRAST:  133mL OMNIPAQUE IOHEXOL 300 MG/ML  SOLN  COMPARISON:  09/29/2013 chest CT and 09/27/2013 abdominal pelvic CT.  FINDINGS: CT CHEST FINDINGS  Lungs/Pleura: Surgical changes of right upper lobectomy. Moderate centrilobular emphysema.  Multiple bilateral pulmonary nodules.  - left upper lobe at 1.4 by 1.1 cm on image 27 versus 1.2 x 1.0 cm on the prior exam.  Right middle lobe at 1.1 cm on image 34 versus 9 mm on the prior.  Left lower lobe 1.2 cm nodule on image 44 has enlarged from 9 mm on the prior.  Right lower lobe nodule which measures 1.6 cm on image 47 has enlarged from 9 mm on the prior.  No airspace opacities.  Paraseptal emphysema as well.  No pleural fluid.  Heart/Mediastinum: A right-sided Port-A-Cath which terminates at the low SVC. No supraclavicular adenopathy. Bilateral breast implants. Extensive atherosclerotic irregularity within the aorta. Upper normal heart size without pericardial effusion. Multivessel coronary artery atherosclerosis. No central pulmonary embolism, on this non-dedicated study. Small middle mediastinal nodes which are unchanged. No mediastinal or hilar adenopathy.  CT ABDOMEN AND PELVIS FINDINGS  Abdomen/Pelvis: No right-sided renal calculi or other calcified  lesions on unenhanced images.  Hyperenhancing hepatic dome lesion of 8 mm on image 13/ series 4, similar to on the prior.  Posterior segment right liver lobe hyperenhancing 1.0 cm lesion image 41/series 4. Increased from 08/14/2013 MRI. Not readily apparent on prior CT, which was not performed with arterial phase imaging.  Lateral hepatic dome hyperenhancing focus on image 15/series 4 is tiny but may be new since the prior MRI.  Anterior left hepatic lobe 1.6 cm hyperenhancing lesion on image 26/series 4 is decreased in size from 2.7 cm on the 09/27/2013 exam.  Normal spleen, stomach, pancreas, gallbladder, biliary tract, right adrenal gland. The left adrenal is not well visualized and may be surgically absent. Status post left nephrectomy, without locally recurrent disease. There is mild fascia thickening along the left diaphragmatic crura, including on image 93/series 6 which is unchanged.  No right renal lesion.  Aortic and branch vessel atherosclerosis. Non aneurysmal dilatation of the infrarenal aorta as unchanged. No retroperitoneal or retrocrural adenopathy. Colonic stool burden suggests constipation. Scattered colonic diverticula. Normal terminal ileum and appendix. Normal small bowel without abdominal ascites. No pelvic adenopathy. Pelvic floor laxity. Otherwise normal urinary bladder, without adnexal mass or significant free pelvic fluid.  Bones/Musculoskeletal: Moderate osteopenia. Left iliac bone island. Mild compression deformities at T3 and T4, new since 09/29/2013.  IMPRESSION: CT CHEST IMPRESSION  1. Moderate progression of pulmonary metastasis since 09/29/2013. 2. New T3 and T4 compression deformities since 09/29/2013.  CT ABDOMEN AND PELVIS IMPRESSION  1. Status post right nephrectomy. Mixed response to hepatic metastasis. The largest lesion is slightly smaller than the 09/27/2013 CT. Other lesions appear new and enlarged when directly compared to 08/14/2013 MRI. Of note, prior CT did not include  arterial phase imaging, complicating direct comparison. 2. Mild pelvic floor laxity. 3.  Possible constipation.   Electronically Signed   By: Abigail Miyamoto M.D.   On: 01/01/2014 12:30      ASSESSMENT & PLAN:  #1 metastatic renal cell carcinoma CT scan show mixed response. Some of the imaging studies are compared with old imaging study. I gave the patient treatment option to continue  Temsirolimus for another 2 months versus switching her to sunitinib. I discussed with her the risks, benefits, side effects and alternatives to treatment. The patient is willing to try sunitinib. We will get insurance preapproval for this. In the meantime I will hold Temsirolimus treatment #2 tobacco abuse I spent some time educating the patient importance of nicotine cessation #3 adrenal insufficiency When she was taking prednisone 5 mg every other day, she appeared to have signs and symptoms of steroid withdrawal. I told her to increase her prednisone to 5 mg, 4 times a week and she is doing well. Continue the same. #4 weight loss This is due to untreated cancer. I recommend she increase oral intake. She is currently on prednisone #5 anemia This is likely anemia of chronic disease. The patient denies recent history of bleeding such as epistaxis, hematuria or hematochezia. She is asymptomatic from the anemia. We will observe for now.  She does not require transfusion now.  #6 tachycardia I suspect this is metabolic related. As mentioned above, I told her to increase taking prednisone more regularly. #7 hyperglycemia and fatigue  The fatigue is due to hyperglycemia. The hyperglycemia is related to her chemotherapy and prednisone. I had prescribed metformin to be taken, half a tablet daily. This was put on hold recently due to CT scan. I told her to resume metformin but to increase it now to 2 times a day. She need to see a primary care provider for hyperglycemia management.    Orders Placed This Encounter   Procedures  . CBC with Differential    Standing Status: Future     Number of Occurrences:      Standing Expiration Date: 01/02/2015  . Comprehensive metabolic panel    Standing Status: Future     Number of Occurrences:      Standing Expiration Date: 01/02/2015   All questions were answered. The patient knows to call the clinic with any problems, questions or concerns. No barriers to learning was detected. I spent 40 minutes counseling the patient face to face. The total time spent in the appointment was 60 minutes and more than 50% was on counseling and review of test results     [employer], [place], MD 01/02/2014 12:54 PM

## 2014-01-02 NOTE — Telephone Encounter (Signed)
Message copied by Cathlean Cower on Fri Jan 02, 2014 12:13 PM ------      Message from: [facility], [place]      Created: Fri Jan 02, 2014 12:08 PM       pls call pt, needs to drink plenty of fluids and see PCP for DM ------

## 2014-01-02 NOTE — Telephone Encounter (Signed)
Informed pt of elevated Blood sugar and kidney function.  Likely r/t recent CT scan she has to hold her Metformin for 48 hrs.  Instructed pt to drink at least 2 to 3 liters per fluid daily to help her kidney function.  Instructed her to resume metformin on Sunday as instructed and to f/u w/ her PCP for ongoing management of diabetes.  She verbalized understanding.

## 2014-01-02 NOTE — Patient Instructions (Signed)
Sunitinib oral capsules What is this medicine? SUNITINIB (soo Kayson Bullis ti nib) is a chemotherapy drug. It targets a specific protein within cancer cells and stops the cancer cells from growing. It is used to treat specific digestive tract tumors called GISTs, advanced kidney cancer, and certain pancreatic neuroendocrine tumors. This medicine may be used for other purposes; ask your health care provider or pharmacist if you have questions. COMMON BRAND NAME(S): Sutent What should I tell my health care provider before I take this medicine? They need to know if you have any of these conditions: -bleeding problems -dental disease -infection (especially a virus infection such as chickenpox, cold sores, or herpes) -heart disease -heart failure -high blood pressure -kidney disease (other than cancer) -liver disease -lung disease -seizures -an unusual or allergic reaction to sunitinib, other medicines, foods, dyes, or preservatives -pregnant or trying to get pregnant -breast-feeding How should I use this medicine? Take this medicine by mouth with a glass of water. Follow the directions on the prescription label. You can take it with or without food. Take your medicine at regular intervals. Do not take your medicine more often than directed. Do not stop taking except on your doctor's advice. Talk to your pediatrician regarding the use of this medicine in children. Special care may be needed. Overdosage: If you think you have taken too much of this medicine contact a poison control center or emergency room at once. NOTE: This medicine is only for you. Do not share this medicine with others. What if I miss a dose? If you miss a dose, take it as soon as you can. If it is almost time for your next dose, take only that dose. Do not take double or extra doses. Tell your doctor if you miss a dose. What may interact with this medicine? Do not take this medicine with any of the following medications: -certain  medicines for fungal infections like fluconazole, itraconazole, ketoconazole, posaconazole, voriconazole -cisapride -dofetilide -dronedarone -grapefruit juice -pimozide -St. John's Wort -thioridazine -ziprasidone  This medicine may also interact with the following medications: -antiviral medicines for HIV or AIDS -barbiturates like phenobarbital -carbamazepine -certain antibiotics like clarithromycin, erythromycin, levofloxacin, mefloquine, telithromycin, rifabutin, rifampin, rifapentine -certain medicines for depression, anxiety, or psychotic disturbances -certain medicines for irregular heart beat like amiodarone, bepridil, encainide, flecainide, propafenone, quinidine -certain medicines for numbness or sleep during surgery -dexamethasone -other medicines that prolong the QT interval (cause an abnormal heart rhythm) -phenytoin This list may not describe all possible interactions. Give your health care provider a list of all the medicines, herbs, non-prescription drugs, or dietary supplements you use. Also tell them if you smoke, drink alcohol, or use illegal drugs. Some items may interact with your medicine. What should I watch for while using this medicine? Visit your doctor for regular check ups. Talk to your doctor about any new or unusual health problems. You will need blood work done while you are taking this medicine. If you have any dental work done, tell your dentist you are receiving this medicine. Do not become pregnant while taking this medicine. Female and female patients should use effective birth control methods while taking this medicine. Women should inform their doctor if they wish to become pregnant or think they might be pregnant. There is a potential for serious side effects to an unborn child. Talk to your health care professional or pharmacist for more information. Do not breast-feed an infant while taking this medicine. What side effects may I notice from receiving  this medicine?  Side effects that you should report to your doctor or health care professional as soon as possible: -allergic reactions like skin rash, itching or hives, swelling of the face, lips, or tongue -breathing problems -dark urine -feeling faint or lightheaded, falls -fever or chills, cough, sore throat -high blood pressure -jaw pain, especially after dental work -mouth sores -seizures -stomach pain -swelling of feet, legs -trouble passing urine or change in the amount of urine -unusual bleeding or bruising -unusually weak or tired Side effects that usually do not require medical attention (report to your doctor or health care professional if they continue or are bothersome): -bone or muscle pain -change in hair color (lighter) -changes in taste -diarrhea -loss of appetite -nausea, vomiting -skin that is cracked, dry, thick, yellow or lightened -stomach upset This list may not describe all possible side effects. Call your doctor for medical advice about side effects. You may report side effects to FDA at 1-800-FDA-1088. Where should I keep my medicine? Keep out of the reach of children. Store at room temperature between 15 and 30 degrees C (59 and 86 degrees F). Throw away any unused medicine after the expiration date. NOTE: This sheet is a summary. It may not cover all possible information. If you have questions about this medicine, talk to your doctor, pharmacist, or health care provider.  2014, [employer]vier/[employer] Standard. (2013-05-05 14:40:58)

## 2014-01-02 NOTE — Progress Notes (Signed)
Rx for Sutent given to Kaylee [name] in managed care dept for prior auth.

## 2014-01-05 ENCOUNTER — Telehealth: Payer: Self-pay | Admitting: Hematology and Oncology

## 2014-01-05 NOTE — Telephone Encounter (Signed)
lmonvm for pt re appt for 3/27 and mailed schedule.

## 2014-01-06 ENCOUNTER — Telehealth: Payer: Self-pay | Admitting: *Deleted

## 2014-01-06 ENCOUNTER — Encounter: Payer: Self-pay | Admitting: Hematology and Oncology

## 2014-01-06 NOTE — Progress Notes (Signed)
Called [employer] to check on sutent prescription, it requires a prior auth.  [facility] @ [phone] for pa form, faxed back to them @ [phone].

## 2014-01-06 NOTE — Telephone Encounter (Signed)
Pt called to check on status of new Rx for Sutent.  Informed her the rx has been faxed to [facility] and I have not heard anything back yet about the status.  Asked her to call us back on Thursday if she still hasn't heard from the pharmacy,  But that it usually takes a few days for them to process the new Rxs.  She verbalized understanding.  Left VM for Carmelina Noun in managed care dept asking her to let nurse know if she knows the status of this medication.

## 2014-01-07 ENCOUNTER — Encounter: Payer: Self-pay | Admitting: Hematology and Oncology

## 2014-01-07 NOTE — Progress Notes (Signed)
[employer] approved sutent 50mg  from 01/06/14-07/05/14

## 2014-01-08 ENCOUNTER — Encounter: Payer: Self-pay | Admitting: *Deleted

## 2014-01-09 ENCOUNTER — Ambulatory Visit: Payer: Medicare HMO

## 2014-01-13 ENCOUNTER — Telehealth: Payer: Self-pay | Admitting: *Deleted

## 2014-01-13 NOTE — Telephone Encounter (Signed)
Authorization for Sutent confirmed with [employer], pt has not heard from [place]. Pt to call [employer] at [phone] to follow up on delivery

## 2014-01-13 NOTE — Telephone Encounter (Signed)
Pt spoke with [employer], they are looking into assistance for the $2000 cost that the patient will owe. [employer] is waiting to hear from the manufacturer. Ms Kimble will follow up with [employer].

## 2014-01-16 ENCOUNTER — Ambulatory Visit (HOSPITAL_BASED_OUTPATIENT_CLINIC_OR_DEPARTMENT_OTHER): Payer: Commercial Managed Care - HMO | Admitting: Hematology and Oncology

## 2014-01-16 ENCOUNTER — Other Ambulatory Visit (HOSPITAL_BASED_OUTPATIENT_CLINIC_OR_DEPARTMENT_OTHER): Payer: Commercial Managed Care - HMO

## 2014-01-16 ENCOUNTER — Encounter: Payer: Self-pay | Admitting: Hematology and Oncology

## 2014-01-16 ENCOUNTER — Telehealth: Payer: Self-pay | Admitting: Hematology and Oncology

## 2014-01-16 VITALS — BP 105/58 | HR 99 | Temp 97.4°F | Resp 18 | Ht 67.0 in | Wt 121.4 lb

## 2014-01-16 DIAGNOSIS — R51 Headache: Secondary | ICD-10-CM

## 2014-01-16 DIAGNOSIS — C649 Malignant neoplasm of unspecified kidney, except renal pelvis: Secondary | ICD-10-CM

## 2014-01-16 DIAGNOSIS — E119 Type 2 diabetes mellitus without complications: Secondary | ICD-10-CM

## 2014-01-16 DIAGNOSIS — C787 Secondary malignant neoplasm of liver and intrahepatic bile duct: Secondary | ICD-10-CM

## 2014-01-16 DIAGNOSIS — R42 Dizziness and giddiness: Secondary | ICD-10-CM

## 2014-01-16 DIAGNOSIS — C7800 Secondary malignant neoplasm of unspecified lung: Secondary | ICD-10-CM

## 2014-01-16 LAB — CBC WITH DIFFERENTIAL/PLATELET
BASO%: 0.6 % (ref 0.0–2.0)
BASOS ABS: 0.1 10*3/uL (ref 0.0–0.1)
EOS%: 0.7 % (ref 0.0–7.0)
Eosinophils Absolute: 0.1 10*3/uL (ref 0.0–0.5)
HEMATOCRIT: 29.9 % — AB (ref 34.8–46.6)
HEMOGLOBIN: 9.8 g/dL — AB (ref 11.6–15.9)
LYMPH%: 20.3 % (ref 14.0–49.7)
MCH: 25.5 pg (ref 25.1–34.0)
MCHC: 32.8 g/dL (ref 31.5–36.0)
MCV: 77.8 fL — ABNORMAL LOW (ref 79.5–101.0)
MONO#: 1.1 10*3/uL — AB (ref 0.1–0.9)
MONO%: 7.1 % (ref 0.0–14.0)
NEUT#: 11 10*3/uL — ABNORMAL HIGH (ref 1.5–6.5)
NEUT%: 71.3 % (ref 38.4–76.8)
PLATELETS: 406 10*3/uL — AB (ref 145–400)
RBC: 3.85 10*6/uL (ref 3.70–5.45)
RDW: 15.9 % — ABNORMAL HIGH (ref 11.2–14.5)
WBC: 15.4 10*3/uL — AB (ref 3.9–10.3)
lymph#: 3.1 10*3/uL (ref 0.9–3.3)

## 2014-01-16 LAB — COMPREHENSIVE METABOLIC PANEL (CC13)
ALT: 9 U/L (ref 0–55)
ANION GAP: 12 meq/L — AB (ref 3–11)
AST: 9 U/L (ref 5–34)
Albumin: 3.2 g/dL — ABNORMAL LOW (ref 3.5–5.0)
Alkaline Phosphatase: 131 U/L (ref 40–150)
BUN: 18.7 mg/dL (ref 7.0–26.0)
CO2: 22 meq/L (ref 22–29)
CREATININE: 1.3 mg/dL — AB (ref 0.6–1.1)
Calcium: 9.9 mg/dL (ref 8.4–10.4)
Chloride: 103 mEq/L (ref 98–109)
GLUCOSE: 279 mg/dL — AB (ref 70–140)
Potassium: 4.1 mEq/L (ref 3.5–5.1)
Sodium: 137 mEq/L (ref 136–145)
Total Bilirubin: 0.3 mg/dL (ref 0.20–1.20)
Total Protein: 7.6 g/dL (ref 6.4–8.3)

## 2014-01-16 NOTE — Telephone Encounter (Signed)
gv adn printd appt sched and avs for pt for April.Kaylee KitchenMarland KitchenMarland [name]

## 2014-01-16 NOTE — Progress Notes (Signed)
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, for further treatment  SUMMARY OF ONCOLOGIC HISTORY: Oncology History   Renal cell carcinoma   Primary site: Kidney (Left)   Staging method: AJCC 7th Edition   Clinical: Stage IV (T3a, N0, M1) signed by Heath Lark, MD on 10/21/2013  8:46 AM   Pathologic: Stage IV (T3a, N0, M1) signed by Heath Lark, MD on 10/21/2013  8:47 AM   Summary: Stage IV (T3a, N0, M1)       Renal cell carcinoma   10/30/2010 Imaging Negative CT abdomen   09/15/2013 Surgery Patient underwent left nephrectomy which confirmed RCC, Fuhrman grade III/IV   09/27/2013 Imaging CT abdomen showed liver metastasis   09/29/2013 Procedure CT guided liver biopsy confrimed metastatic disease   09/29/2013 Imaging CT chest showed enlarging pulmonary metastases   09/30/2013 - 10/10/2013 Hospital Admission Admitted for orthostatic hypotension and headaches and severe malnutrition   10/21/2013 - 10/23/2013 Hospital Admission She was admitted to the hospital with recurrent urinary tract infection, dehydration, severe headaches and adrenal insufficiency.   11/07/2013 - 12/26/2013 Chemotherapy The patient begun palliative chemotherapy with weekly Temsirolimus   01/01/2014 Imaging Repeat CT scan show mixed response to treatment.    INTERVAL HISTORY: Kaylee [name] 73 y.o. female returns for further evaluation. Should She has been complaining of polyuria and polydipsia. She complained of dizziness and had 1 fall recently. She also have regular headaches. Denies any nausea. She continued to smoke, and willing to quit right now.  I have reviewed the past medical history, past surgical history, social history and family history with the patient and they are unchanged from previous  note.  ALLERGIES:  is allergic to penicillins and codeine.  MEDICATIONS:  Current Outpatient Prescriptions  Medication Sig Dispense Refill  . ALPRAZolam (XANAX) 0.25 MG tablet Take 1 tablet (0.25 mg total) by mouth 2 (two) times daily as needed for anxiety or sleep.  30 tablet  0  . lidocaine-prilocaine (EMLA) cream Apply 1 application topically as needed (port).      . metFORMIN (GLUCOPHAGE) 500 MG tablet Take 1 tablet (500 mg total) by mouth daily with breakfast.  30 tablet  1  . omeprazole (PRILOSEC) 40 MG capsule Take 40 mg by mouth daily.      . Oxycodone HCl 10 MG TABS Take 10 mg by mouth every 3 (three) hours as needed (pain).      Marland Kitchen PARoxetine (PAXIL) 20 MG tablet Take 20 mg by mouth every evening.      . polyethylene glycol (MIRALAX / GLYCOLAX) packet Take 17 g by mouth daily.      . predniSONE (DELTASONE) 10 MG tablet Take 5 mg by mouth 2 (two) times a week. Tues and Thursday      . promethazine (PHENERGAN) 25 MG tablet Take 25 mg by mouth every 6 (six) hours as needed for nausea.      . SUNItinib (SUTENT) 50 MG capsule Take 1 capsule (50 mg total) by mouth daily.  42 capsule  0   No current facility-administered medications for this visit.    REVIEW OF SYSTEMS:   Constitutional: Denies fevers, chills or abnormal weight loss Eyes: Denies blurriness of vision Ears, nose, mouth, throat, and face: Denies mucositis or sore throat Respiratory: Denies cough, dyspnea or wheezes Cardiovascular: Denies palpitation,  chest discomfort or lower extremity swelling Gastrointestinal:  Denies nausea, heartburn or change in bowel habits Skin: Denies abnormal skin rashes Lymphatics: Denies new lymphadenopathy or easy bruising Neurological:Denies numbness, tingling or new weaknesses Behavioral/Psych: Mood is stable, no new changes  All other systems were reviewed with the patient and are negative.  PHYSICAL EXAMINATION: ECOG PERFORMANCE STATUS: 1 - Symptomatic but completely  ambulatory  Filed Vitals:   01/16/14 1256  BP: 105/58  Pulse: 99  Temp: 97.4 F (36.3 C)  Resp: 18   Filed Weights   01/16/14 1256  Weight: 121 lb 6.4 oz (55.067 kg)    GENERAL:alert, no distress and comfortable. She looks thin and mildly cachectic SKIN: skin color, texture, turgor are normal, no rashes or significant lesions EYES: normal, Conjunctiva are pink and non-injected, sclera clear Musculoskeletal:no cyanosis of digits and no clubbing  NEURO: alert & oriented x 3 with fluent speech, no focal motor/sensory deficits  LABORATORY DATA:  I have reviewed the data as listed    Component Value Date/Time   NA 133* 01/02/2014 1052   NA 135* 10/23/2013 0409   K 4.3 01/02/2014 1052   K 4.6 10/23/2013 0409   CL 99 10/23/2013 0409   CO2 21* 01/02/2014 1052   CO2 26 10/23/2013 0409   GLUCOSE 393* 01/02/2014 1052   GLUCOSE 215* 10/23/2013 0409   BUN 16.4 01/02/2014 1052   BUN 16 10/23/2013 0409   CREATININE 1.4* 01/02/2014 1052   CREATININE 0.74 10/23/2013 0409   CALCIUM 9.8 01/02/2014 1052   CALCIUM 9.0 10/23/2013 0409   PROT 7.7 01/02/2014 1052   PROT 7.0 10/15/2013 1316   ALBUMIN 3.3* 01/02/2014 1052   ALBUMIN 3.5 10/15/2013 1316   AST 8 01/02/2014 1052   AST 12 10/15/2013 1316   ALT <6 01/02/2014 1052   ALT 10 10/15/2013 1316   ALKPHOS 139 01/02/2014 1052   ALKPHOS 81 10/15/2013 1316   BILITOT 0.31 01/02/2014 1052   BILITOT 0.6 10/15/2013 1316   GFRNONAA 82* 10/23/2013 0409   GFRAA >90 10/23/2013 0409    No results found for this basename: SPEP, UPEP,  kappa and lambda light chains    Lab Results  Component Value Date   WBC 15.4* 01/16/2014   NEUTROABS 11.0* 01/16/2014   HGB 9.8* 01/16/2014   HCT 29.9* 01/16/2014   MCV 77.8* 01/16/2014   PLT 406* 01/16/2014      Chemistry      Component Value Date/Time   NA 133* 01/02/2014 1052   NA 135* 10/23/2013 0409   K 4.3 01/02/2014 1052   K 4.6 10/23/2013 0409   CL 99 10/23/2013 0409   CO2 21* 01/02/2014 1052   CO2 26 10/23/2013 0409   BUN 16.4  01/02/2014 1052   BUN 16 10/23/2013 0409   CREATININE 1.4* 01/02/2014 1052   CREATININE 0.74 10/23/2013 0409      Component Value Date/Time   CALCIUM 9.8 01/02/2014 1052   CALCIUM 9.0 10/23/2013 0409   ALKPHOS 139 01/02/2014 1052   ALKPHOS 81 10/15/2013 1316   AST 8 01/02/2014 1052   AST 12 10/15/2013 1316   ALT <6 01/02/2014 1052   ALT 10 10/15/2013 1316   BILITOT 0.31 01/02/2014 1052   BILITOT 0.6 10/15/2013 1316     ASSESSMENT & PLAN:  #1 metastatic renal cell carcinoma I am still waiting for insurance prior approval for her to start Sutent. Continue supportive care. #2 dizziness #3 recent fall Her blood pressure is low. I told her to resume  taking prednisone daily. #4 headache I am concerned about possible intracranial metastasis. I will order MRI of the head for urgent evaluation. #5 poorly controlled diabetes She has strong family history of diabetes. Recent treatment certainly can cause diabetes is well. She is also placed on regular prednisone therapy. I recommend her to increase metformin to 2 times a day. We will continue monitor her blood sugar carefully.  Orders Placed This Encounter  Procedures  . MR Brain W Contrast    Standing Status: Future     Number of Occurrences:      Standing Expiration Date: 03/18/2015    Order Specific Question:  Reason for Exam (SYMPTOM  OR DIAGNOSIS REQUIRED)    Answer:  dizziness, headache, kidney ca, r/o mets    Order Specific Question:  Preferred imaging location?    Answer:  [facility]    Order Specific Question:  Does the patient have a pacemaker or implanted devices?    Answer:  No  . CBC with Differential    Standing Status: Future     Number of Occurrences:      Standing Expiration Date: 01/16/2015  . Comprehensive metabolic panel    Standing Status: Future     Number of Occurrences:      Standing Expiration Date: 01/16/2015   All questions were answered. The patient knows to call the clinic with any problems,  questions or concerns. No barriers to learning was detected.    [place], [place], MD 01/16/2014 1:15 PM

## 2014-01-19 ENCOUNTER — Telehealth: Payer: Self-pay | Admitting: *Deleted

## 2014-01-19 ENCOUNTER — Encounter: Payer: Self-pay | Admitting: Hematology and Oncology

## 2014-01-19 NOTE — Telephone Encounter (Signed)
Pt left VM this morning requesting a letter of "Medical Necessity" to [place] to get co pay assistance for Sutent. Pt's Acc [id number].  Faxed letter from Dr. Alvy Bimler to [place] at [employer] to fax [phone].    Notified pt of letter faxed.

## 2014-01-21 ENCOUNTER — Telehealth: Payer: Self-pay | Admitting: *Deleted

## 2014-01-21 NOTE — Telephone Encounter (Signed)
Would appreciate if you can drop a note so I can remember when she starts

## 2014-01-21 NOTE — Telephone Encounter (Signed)
Received Call from [place], who states pt has been approved for Co-Pay assistance for Sutent. Pt is approved from 01/21/14 thru 01/22/15 and will received Sutent free of charge.  Medication will be delivered to pt tomorrow.

## 2014-01-23 ENCOUNTER — Telehealth: Payer: Self-pay | Admitting: *Deleted

## 2014-01-23 NOTE — Telephone Encounter (Signed)
Pt states her Sutent came by [employer] today and she will start taking this evening.

## 2014-01-29 ENCOUNTER — Other Ambulatory Visit: Payer: Self-pay | Admitting: Hematology and Oncology

## 2014-01-29 ENCOUNTER — Ambulatory Visit (HOSPITAL_COMMUNITY)
Admission: RE | Admit: 2014-01-29 | Discharge: 2014-01-29 | Disposition: A | Discharge status: Home / Self Care | Payer: Medicare HMO | Source: Ambulatory Visit | Attending: Hematology and Oncology | Admitting: Hematology and Oncology

## 2014-01-29 DIAGNOSIS — C7952 Secondary malignant neoplasm of bone marrow: Secondary | ICD-10-CM

## 2014-01-29 DIAGNOSIS — R42 Dizziness and giddiness: Secondary | ICD-10-CM | POA: Insufficient documentation

## 2014-01-29 DIAGNOSIS — C649 Malignant neoplasm of unspecified kidney, except renal pelvis: Secondary | ICD-10-CM | POA: Insufficient documentation

## 2014-01-29 DIAGNOSIS — C7951 Secondary malignant neoplasm of bone: Secondary | ICD-10-CM | POA: Diagnosis not present

## 2014-01-29 DIAGNOSIS — G936 Cerebral edema: Secondary | ICD-10-CM | POA: Diagnosis not present

## 2014-01-29 MED ORDER — GADOBENATE DIMEGLUMINE 529 MG/ML IV SOLN
5.0000 mL | Freq: Once | INTRAVENOUS | Status: AC | PRN
  Administered 2014-01-29: 5 mL via INTRAVENOUS

## 2014-01-30 ENCOUNTER — Ambulatory Visit (HOSPITAL_BASED_OUTPATIENT_CLINIC_OR_DEPARTMENT_OTHER): Payer: Commercial Managed Care - HMO | Admitting: Hematology and Oncology

## 2014-01-30 ENCOUNTER — Encounter: Payer: Self-pay | Admitting: Hematology and Oncology

## 2014-01-30 ENCOUNTER — Other Ambulatory Visit: Payer: Self-pay | Admitting: Radiation Therapy

## 2014-01-30 ENCOUNTER — Telehealth: Payer: Self-pay | Admitting: Hematology and Oncology

## 2014-01-30 ENCOUNTER — Other Ambulatory Visit (HOSPITAL_BASED_OUTPATIENT_CLINIC_OR_DEPARTMENT_OTHER): Payer: Commercial Managed Care - HMO

## 2014-01-30 VITALS — BP 105/59 | HR 101 | Temp 97.7°F | Resp 18 | Ht 67.0 in | Wt 118.7 lb

## 2014-01-30 DIAGNOSIS — C7949 Secondary malignant neoplasm of other parts of nervous system: Principal | ICD-10-CM

## 2014-01-30 DIAGNOSIS — G4700 Insomnia, unspecified: Secondary | ICD-10-CM

## 2014-01-30 DIAGNOSIS — C649 Malignant neoplasm of unspecified kidney, except renal pelvis: Secondary | ICD-10-CM

## 2014-01-30 DIAGNOSIS — R42 Dizziness and giddiness: Secondary | ICD-10-CM

## 2014-01-30 DIAGNOSIS — F3289 Other specified depressive episodes: Secondary | ICD-10-CM

## 2014-01-30 DIAGNOSIS — C7800 Secondary malignant neoplasm of unspecified lung: Secondary | ICD-10-CM

## 2014-01-30 DIAGNOSIS — I959 Hypotension, unspecified: Secondary | ICD-10-CM

## 2014-01-30 DIAGNOSIS — C7931 Secondary malignant neoplasm of brain: Secondary | ICD-10-CM

## 2014-01-30 DIAGNOSIS — C787 Secondary malignant neoplasm of liver and intrahepatic bile duct: Secondary | ICD-10-CM

## 2014-01-30 DIAGNOSIS — F329 Major depressive disorder, single episode, unspecified: Secondary | ICD-10-CM

## 2014-01-30 DIAGNOSIS — E039 Hypothyroidism, unspecified: Secondary | ICD-10-CM | POA: Insufficient documentation

## 2014-01-30 DIAGNOSIS — R630 Anorexia: Secondary | ICD-10-CM

## 2014-01-30 DIAGNOSIS — E119 Type 2 diabetes mellitus without complications: Secondary | ICD-10-CM

## 2014-01-30 DIAGNOSIS — E46 Unspecified protein-calorie malnutrition: Secondary | ICD-10-CM

## 2014-01-30 HISTORY — DX: Hypothyroidism, unspecified: E03.9

## 2014-01-30 LAB — COMPREHENSIVE METABOLIC PANEL (CC13)
ALBUMIN: 2.9 g/dL — AB (ref 3.5–5.0)
ALK PHOS: 101 U/L (ref 40–150)
AST: 9 U/L (ref 5–34)
Anion Gap: 9 mEq/L (ref 3–11)
BUN: 19.9 mg/dL (ref 7.0–26.0)
CHLORIDE: 103 meq/L (ref 98–109)
CO2: 25 mEq/L (ref 22–29)
Calcium: 9.3 mg/dL (ref 8.4–10.4)
Creatinine: 1.1 mg/dL (ref 0.6–1.1)
Glucose: 125 mg/dl (ref 70–140)
POTASSIUM: 4.4 meq/L (ref 3.5–5.1)
SODIUM: 137 meq/L (ref 136–145)
TOTAL PROTEIN: 7.1 g/dL (ref 6.4–8.3)
Total Bilirubin: 0.26 mg/dL (ref 0.20–1.20)

## 2014-01-30 LAB — CBC WITH DIFFERENTIAL/PLATELET
BASO%: 0.8 % (ref 0.0–2.0)
Basophils Absolute: 0.1 10*3/uL (ref 0.0–0.1)
EOS%: 0.9 % (ref 0.0–7.0)
Eosinophils Absolute: 0.1 10*3/uL (ref 0.0–0.5)
HCT: 32.6 % — ABNORMAL LOW (ref 34.8–46.6)
HGB: 10.6 g/dL — ABNORMAL LOW (ref 11.6–15.9)
LYMPH#: 3.5 10*3/uL — AB (ref 0.9–3.3)
LYMPH%: 25.1 % (ref 14.0–49.7)
MCH: 25.3 pg (ref 25.1–34.0)
MCHC: 32.4 g/dL (ref 31.5–36.0)
MCV: 78.2 fL — AB (ref 79.5–101.0)
MONO#: 0.8 10*3/uL (ref 0.1–0.9)
MONO%: 5.6 % (ref 0.0–14.0)
NEUT%: 67.6 % (ref 38.4–76.8)
NEUTROS ABS: 9.4 10*3/uL — AB (ref 1.5–6.5)
Platelets: 361 10*3/uL (ref 145–400)
RBC: 4.17 10*6/uL (ref 3.70–5.45)
RDW: 17 % — AB (ref 11.2–14.5)
WBC: 13.8 10*3/uL — ABNORMAL HIGH (ref 3.9–10.3)

## 2014-01-30 MED ORDER — DEXAMETHASONE 4 MG PO TABS: 4.0000 mg | ORAL_TABLET | Freq: Two times a day (BID) | ORAL | Status: DC

## 2014-01-30 MED ORDER — MIRTAZAPINE 15 MG PO TABS: 15.0000 mg | ORAL_TABLET | Freq: Every day | ORAL | Status: DC

## 2014-01-30 MED ORDER — OXYCODONE HCL 10 MG PO TABS: 10.0000 mg | ORAL_TABLET | ORAL | Status: DC | PRN

## 2014-01-30 NOTE — Progress Notes (Signed)
[facility] OFFICE PROGRESS NOTE  Patient Care Team: [name], MD as PCP - General (Internal Medicine) Heath Lark, MD as Consulting Physician (Hematology and Oncology)  DIAGNOSIS: Metastatic kidney cancer to liver, lung and brain  SUMMARY OF ONCOLOGIC HISTORY: Oncology History   Renal cell carcinoma   Primary site: Kidney (Left)   Staging method: AJCC 7th Edition   Clinical: Stage IV (T3a, N0, M1) signed by Heath Lark, MD on 10/21/2013  8:46 AM   Pathologic: Stage IV (T3a, N0, M1) signed by Heath Lark, MD on 10/21/2013  8:47 AM   Summary: Stage IV (T3a, N0, M1)       Renal cell carcinoma   10/30/2010 Imaging Negative CT abdomen   09/15/2013 Surgery Patient underwent left nephrectomy which confirmed RCC, Fuhrman grade III/IV   09/27/2013 Imaging CT abdomen showed liver metastasis   09/29/2013 Procedure CT guided liver biopsy confrimed metastatic disease   09/29/2013 Imaging CT chest showed enlarging pulmonary metastases   09/30/2013 - 10/10/2013 Hospital Admission Admitted for orthostatic hypotension and headaches and severe malnutrition   10/21/2013 - 10/23/2013 Hospital Admission She was admitted to the hospital with recurrent urinary tract infection, dehydration, severe headaches and adrenal insufficiency.   11/07/2013 - 12/26/2013 Chemotherapy The patient begun palliative chemotherapy with weekly Temsirolimus   01/01/2014 Imaging Repeat CT scan show mixed response to treatment.   01/23/2014 -  Chemotherapy The patient was started on sunitinib.   01/29/2014 Imaging MRI of the head show a new brain lesion    INTERVAL HISTORY: Kaylee [name] 73 y.o. female returns for further followup. She still complaining of weak and intermittent headache. Denies any nausea. She was started on Sutent a week ago. She tolerated treatment well so far with no side effects.  I have reviewed the past medical history, past surgical history, social history and family history with the  patient and they are unchanged from previous note.  ALLERGIES:  is allergic to penicillins and codeine.  MEDICATIONS:  Current Outpatient Prescriptions  Medication Sig Dispense Refill  . lidocaine-prilocaine (EMLA) cream Apply 1 application topically as needed (port).      . metFORMIN (GLUCOPHAGE) 500 MG tablet Take 1 tablet (500 mg total) by mouth daily with breakfast.  30 tablet  1  . omeprazole (PRILOSEC) 40 MG capsule Take 40 mg by mouth daily.      Marland Kitchen PARoxetine (PAXIL) 20 MG tablet Take 20 mg by mouth every evening.      . polyethylene glycol (MIRALAX / GLYCOLAX) packet Take 17 g by mouth daily.      . predniSONE (DELTASONE) 10 MG tablet Take 5 mg by mouth daily. Tues and Thursday      . promethazine (PHENERGAN) 25 MG tablet Take 25 mg by mouth every 6 (six) hours as needed for nausea.      . SUNItinib (SUTENT) 50 MG capsule Take 1 capsule (50 mg total) by mouth daily.  42 capsule  0  . dexamethasone (DECADRON) 4 MG tablet Take 1 tablet (4 mg total) by mouth 2 (two) times daily.  60 tablet  0  . mirtazapine (REMERON) 15 MG tablet Take 1 tablet (15 mg total) by mouth at bedtime.  30 tablet  3  . Oxycodone HCl 10 MG TABS Take 1 tablet (10 mg total) by mouth every 3 (three) hours as needed (pain).  60 tablet  0   No current facility-administered medications for this visit.    REVIEW OF SYSTEMS:   Constitutional: Denies  fevers, chills Eyes: Denies blurriness of vision Ears, nose, mouth, throat, and face: Denies mucositis or sore throat Respiratory: Denies cough, dyspnea or wheezes Cardiovascular: Denies palpitation, chest discomfort or lower extremity swelling Gastrointestinal:  Denies nausea, heartburn or change in bowel habits Skin: Denies abnormal skin rashes Lymphatics: Denies new lymphadenopathy or easy bruising Neurological:Denies numbness, tingling  Behavioral/Psych: Mood is stable, no new changes  All other systems were reviewed with the patient and are negative.  PHYSICAL  EXAMINATION: ECOG PERFORMANCE STATUS: 1 - Symptomatic but completely ambulatory  Filed Vitals:   01/30/14 1156  BP: 105/59  Pulse: 101  Temp: 97.7 F (36.5 C)  Resp: 18   Filed Weights   01/30/14 1156  Weight: 118 lb 11.2 oz (53.842 kg)    GENERAL:alert, no distress and comfortable. She looks thin and cachectic SKIN: skin color, texture, turgor are normal, no rashes or significant lesions EYES: normal, Conjunctiva are pink and non-injected, sclera clear OROPHARYNX:no exudate, no erythema and lips, buccal mucosa, and tongue normal  Musculoskeletal:no cyanosis of digits and no clubbing  NEURO: alert & oriented x 3 with fluent speech, no focal motor/sensory deficits  LABORATORY DATA:  I have reviewed the data as listed    Component Value Date/Time   NA 137 01/30/2014 1146   NA 135* 10/23/2013 0409   K 4.4 01/30/2014 1146   K 4.6 10/23/2013 0409   CL 99 10/23/2013 0409   CO2 25 01/30/2014 1146   CO2 26 10/23/2013 0409   GLUCOSE 125 01/30/2014 1146   GLUCOSE 215* 10/23/2013 0409   BUN 19.9 01/30/2014 1146   BUN 16 10/23/2013 0409   CREATININE 1.1 01/30/2014 1146   CREATININE 0.74 10/23/2013 0409   CALCIUM 9.3 01/30/2014 1146   CALCIUM 9.0 10/23/2013 0409   PROT 7.1 01/30/2014 1146   PROT 7.0 10/15/2013 1316   ALBUMIN 2.9* 01/30/2014 1146   ALBUMIN 3.5 10/15/2013 1316   AST 9 01/30/2014 1146   AST 12 10/15/2013 1316   ALT <6 01/30/2014 1146   ALT 10 10/15/2013 1316   ALKPHOS 101 01/30/2014 1146   ALKPHOS 81 10/15/2013 1316   BILITOT 0.26 01/30/2014 1146   BILITOT 0.6 10/15/2013 1316   GFRNONAA 82* 10/23/2013 0409   GFRAA >90 10/23/2013 0409    No results found for this basename: SPEP,  UPEP,   kappa and lambda light chains    Lab Results  Component Value Date   WBC 13.8* 01/30/2014   NEUTROABS 9.4* 01/30/2014   HGB 10.6* 01/30/2014   HCT 32.6* 01/30/2014   MCV 78.2* 01/30/2014   PLT 361 01/30/2014      Chemistry      Component Value Date/Time   NA 137 01/30/2014 1146   NA 135* 10/23/2013  0409   K 4.4 01/30/2014 1146   K 4.6 10/23/2013 0409   CL 99 10/23/2013 0409   CO2 25 01/30/2014 1146   CO2 26 10/23/2013 0409   BUN 19.9 01/30/2014 1146   BUN 16 10/23/2013 0409   CREATININE 1.1 01/30/2014 1146   CREATININE 0.74 10/23/2013 0409      Component Value Date/Time   CALCIUM 9.3 01/30/2014 1146   CALCIUM 9.0 10/23/2013 0409   ALKPHOS 101 01/30/2014 1146   ALKPHOS 81 10/15/2013 1316   AST 9 01/30/2014 1146   AST 12 10/15/2013 1316   ALT <6 01/30/2014 1146   ALT 10 10/15/2013 1316   BILITOT 0.26 01/30/2014 1146   BILITOT 0.6 10/15/2013 1316  RADIOGRAPHIC STUDIES: I reviewed the imaging study with her and family I have personally reviewed the radiological images as listed and agreed with the findings in the report. Mr Jeri Cos Wo Contrast  01/29/2014   CLINICAL DATA:  Renal cell carcinoma.  Dizziness.  EXAM: MRI HEAD WITHOUT AND WITH CONTRAST  TECHNIQUE: Multiplanar, multiecho pulse sequences of the brain and surrounding structures were obtained without and with intravenous contrast.  CONTRAST:  20mL MULTIHANCE GADOBENATE DIMEGLUMINE 529 MG/ML IV SOLN  COMPARISON:  10/15/2013.  FINDINGS: New 6 mm right posterior frontal parasagittal metastasis with surrounding edema. Slight central necrosis. No blood products. Correlate clinically as a cause for dizziness. No other definite metastatic lesions. Stable patchy areas of T2 and FLAIR hyperintensity in the white matter consistent with chronic microvascular ischemic change. Mild atrophy. Bilateral cataract extraction. No sinus or mastoid disease. Flow voids are maintained. No definite osseous lesions. Slight dependent mastoid fluid on the left.  Compared with prior MR, the metastasis is a new finding.  Scanning  IMPRESSION: New 6 mm right posterior frontal parasagittal metastasis with surrounding edema. Findings discussed with ordering provider who provided instructions for the patient to proceed today to the cancer center.   Electronically Signed   By:  Rolla Flatten M.D.   On: 01/29/2014 13:23      ASSESSMENT & PLAN:  #1 metastatic renal cell carcinoma She tolerated Sutent well so far. Continue supportive care. #2 dizziness #3 recent fall Her blood pressure is low. I recommend she increase oral intake #4 headache with new brain metastasis I will place her on dexamethasone 4 mg twice a day and refer her to radiation oncologist for radiation therapy.  #5 poorly controlled diabetes I recommend her to increase metformin to 2 times a day. We will continue monitor her blood sugar carefully. recommend she call her primary care provider as expected her blood sugar control will be worse with increased dose of dexamethasone   #6 protein calorie malnutrition Hopefully with increased dexamethasone she would have better appetite #7 insomnia and anorexia I will start her on Remeron  Orders Placed This Encounter  Procedures  . CBC with Differential    Standing Status: Future     Number of Occurrences:      Standing Expiration Date: 01/30/2015  . Comprehensive metabolic panel    Standing Status: Future     Number of Occurrences:      Standing Expiration Date: 01/30/2015  . T4, free    Standing Status: Future     Number of Occurrences:      Standing Expiration Date: 01/30/2015  . TSH    Standing Status: Future     Number of Occurrences:      Standing Expiration Date: 01/30/2015  . Ambulatory referral to Radiation Oncology    Referral Priority:  Routine    Referral Type:  Consultation    Referral Reason:  Specialty Services Required    Referred to Provider:  Lora Paula, MD    Requested Specialty:  Radiation Oncology    Number of Visits Requested:  1   All questions were answered. The patient knows to call the clinic with any problems, questions or concerns. No barriers to learning was detected. I spent 40 minutes counseling the patient face to face. The total time spent in the appointment was 60 minutes and more than 50% was on  counseling and review of test results     Heath Lark, MD 01/30/2014 2:03 PM

## 2014-01-30 NOTE — Telephone Encounter (Signed)
Gave pt appt for lab and MD, talked to Santiago Glad regarding Radiation appt , she will check with dr Tammi Klippel, no availability, she will call pt, gave pt tel number to Radiation

## 2014-02-03 ENCOUNTER — Encounter: Payer: Self-pay | Admitting: Radiation Oncology

## 2014-02-03 ENCOUNTER — Ambulatory Visit
Admission: RE | Admit: 2014-02-03 | Discharge: 2014-02-03 | Disposition: A | Discharge status: Home / Self Care | Payer: Commercial Managed Care - HMO | Source: Ambulatory Visit | Attending: Radiation Oncology | Admitting: Radiation Oncology

## 2014-02-03 DIAGNOSIS — C7931 Secondary malignant neoplasm of brain: Secondary | ICD-10-CM

## 2014-02-03 DIAGNOSIS — C7949 Secondary malignant neoplasm of other parts of nervous system: Principal | ICD-10-CM

## 2014-02-03 MED ORDER — GADOBENATE DIMEGLUMINE 529 MG/ML IV SOLN
10.0000 mL | Freq: Once | INTRAVENOUS | Status: AC | PRN
  Administered 2014-02-03: 10 mL via INTRAVENOUS

## 2014-02-03 NOTE — Progress Notes (Signed)
Location/Histology of Brain Tumor: new six mm right posterior frontal parasagittal metastasis with surrounding edema  Patient presented with symptoms of:  Weak intermittent headaches  Past or anticipated interventions, if any, per neurosurgery: None  Past or anticipated interventions, if any, per medical oncology: Sutent supportive care for renal cell carcinoma   Dose of Decadron, if applicable: Decadron 4 mg bid  Recent neurologic symptoms, if any:   Seizures:   Headaches: weak intermittent headache  Nausea: Denies  Dizziness/ataxia: YES  Difficulty with hand coordination:   Focal numbness/weakness: recent falls  Visual deficits/changes:   Confusion/Memory deficits:   Painful bone metastases at present, if any: None noted  SAFETY ISSUES:  Prior radiation?   Pacemaker/ICD? NO  Possible current pregnancy? NO  Is the patient on methotrexate? NO  Additional Complaints / other details: 73 year old female. DX: metastatic kidney cancer to liver, lung and brain

## 2014-02-04 ENCOUNTER — Ambulatory Visit
Admission: RE | Admit: 2014-02-04 | Discharge: 2014-02-04 | Disposition: A | Discharge status: Home / Self Care | Payer: Medicare HMO | Source: Ambulatory Visit | Attending: Radiation Oncology | Admitting: Radiation Oncology

## 2014-02-04 ENCOUNTER — Encounter: Payer: Self-pay | Admitting: Radiation Oncology

## 2014-02-04 VITALS — BP 112/70 | HR 86 | Temp 98.3°F | Ht 66.5 in | Wt 123.1 lb

## 2014-02-04 DIAGNOSIS — E119 Type 2 diabetes mellitus without complications: Secondary | ICD-10-CM | POA: Insufficient documentation

## 2014-02-04 DIAGNOSIS — J4489 Other specified chronic obstructive pulmonary disease: Secondary | ICD-10-CM | POA: Insufficient documentation

## 2014-02-04 DIAGNOSIS — C7931 Secondary malignant neoplasm of brain: Secondary | ICD-10-CM | POA: Insufficient documentation

## 2014-02-04 DIAGNOSIS — C7949 Secondary malignant neoplasm of other parts of nervous system: Principal | ICD-10-CM

## 2014-02-04 DIAGNOSIS — J449 Chronic obstructive pulmonary disease, unspecified: Secondary | ICD-10-CM | POA: Insufficient documentation

## 2014-02-04 DIAGNOSIS — Z79899 Other long term (current) drug therapy: Secondary | ICD-10-CM | POA: Insufficient documentation

## 2014-02-04 DIAGNOSIS — C649 Malignant neoplasm of unspecified kidney, except renal pelvis: Secondary | ICD-10-CM | POA: Insufficient documentation

## 2014-02-04 DIAGNOSIS — E039 Hypothyroidism, unspecified: Secondary | ICD-10-CM | POA: Insufficient documentation

## 2014-02-04 DIAGNOSIS — K219 Gastro-esophageal reflux disease without esophagitis: Secondary | ICD-10-CM | POA: Insufficient documentation

## 2014-02-04 DIAGNOSIS — Z85118 Personal history of other malignant neoplasm of bronchus and lung: Secondary | ICD-10-CM | POA: Insufficient documentation

## 2014-02-04 DIAGNOSIS — Z905 Acquired absence of kidney: Secondary | ICD-10-CM | POA: Insufficient documentation

## 2014-02-04 DIAGNOSIS — F17200 Nicotine dependence, unspecified, uncomplicated: Secondary | ICD-10-CM | POA: Insufficient documentation

## 2014-02-04 DIAGNOSIS — Z902 Acquired absence of lung [part of]: Secondary | ICD-10-CM | POA: Insufficient documentation

## 2014-02-04 DIAGNOSIS — Z90710 Acquired absence of both cervix and uterus: Secondary | ICD-10-CM | POA: Insufficient documentation

## 2014-02-04 NOTE — Progress Notes (Signed)
  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  Identity was confirmed.  All relevant records and images related to the planned course of therapy were reviewed.  The patient freely provided informed written consent to proceed with treatment after reviewing the details related to the planned course of therapy. The consent form was witnessed and verified by the simulation staff. Intravenous access was established for contrast administration. Then, the patient was set-up in a stable reproducible supine position for radiation therapy.  A relocatable thermoplastic stereotactic head frame was fabricated for precise immobilization.  CT images were obtained.  Surface markings were placed.  The CT images were loaded into the planning software and fused with the patient's targeting MRI scan.  Then the target and avoidance structures were contoured.  Treatment planning then occurred.  The radiation prescription was entered and confirmed.  I have requested 3D planning  I have requested a DVH of the following structures: Brain stem, brain, left eye, right eye, lenses, optic chiasm, target volumes, uninvolved brain, and normal tissue.    PLAN:  The patient will receive 20 Gy in one fraction.  ________________________________  Sheral Apley Tammi Klippel, M.D.

## 2014-02-04 NOTE — Progress Notes (Signed)
Please see the Nurse Progress Note in the MD Initial Consult Encounter for this patient. 

## 2014-02-04 NOTE — Progress Notes (Signed)
Location/Histology of Brain Tumor: new six mm right posterior frontal parasagittal metastasis with surrounding edema   Patient presented with symptoms of: Weak intermittent headaches.  Last headache yesterday.  Past or anticipated interventions, if any, per neurosurgery: None   Past or anticipated interventions, if any, per medical oncology: Sutent supportive care for renal cell carcinoma   Dose of Decadron, if applicable: Decadron 4 mg bid   Recent neurologic symptoms, if any:   Seizures: no Headaches: weak intermittent headache  Nausea: yes  Dizziness/ataxia: YES  Difficulty with hand coordination: no Focal numbness/weakness: recent falls  Visual deficits/changes: no Confusion/Memory deficits: forgetful  Painful bone metastases at present, if any: None noted   SAFETY ISSUES:  Prior radiation? no Pacemaker/ICD? NO  Possible current pregnancy? NO  Is the patient on methotrexate? NO  Additional Complaints / other details: 73 year old female. DX: metastatic kidney cancer to liver, lung and brain.  Reports having sharp pain on her left upper abdomen.  Had lung cancer in 1985.  Here with her two sisters.

## 2014-02-04 NOTE — Progress Notes (Signed)
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  HISTORY OF PRESENT ILLNESS::Kaylee [name] is a 73 y.o. female with a background history of lung cancer diagnosed in 35 and underwent right upper lobectomy in [place] for a tumor described as the size of a dime or quarter.  No lymph nodes were positive according to the patient's recollection.  The subtype of lung cancer and staging was unknown. She did not need adjuvant treatment.   In the Fall, she had a fall in [place] and suspected a rib fracture and was evaluated at urgent care.  Chest X-ray showed no rib fracture, but, Dr. Virgina Jock recommended a chest CT. She had Chest CT at [facility] on 07/21/13, which showed a few rounded non-calcified lung nodules.  PET scan 07/21/2013 showed multiple, bilateral lung nodules as well as a large mass involving the left kidney with apparent vascular invasion into the left to renal vein. This was concerning for a renal cell carcinoma. She was referred to Dr. Tresa Moore.  On 09/15/2013, she underwent robotic-assisted laparoscopic left radical nephrectomy. 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 - RENAL VEIN AND RENAL VEIN MARGIN INVOLVED BY TUMOR. - TUMOR INVADES RENAL SINUS ADIPOSE TISSUE. - TWO BENIGN PERINEPHRIC LYMPH NODES (0/2). She was subsequently discharged on 09/17/2013.  On 09/27/2013, she was readmitted to the hospital due to dizziness, frequent urination and difficulties tolerating oral diet. She was evaluated in the emergency department. At the ER, imaging study of the abdomen revealed significant progression and more extensive pulmonary metastasis as well as new  lesions in the liver. She was admitted to the hospital for pain control, IV fluids as well as medication to control the nausea.  She underwent CT-guided biopsy confirmed the diagnosis of metastatic cancer.  She has received Temsirolimus from 11/07/13-12/26/13 and with mixed respoinse at 01/01/14 CT, switched to sutinimib on 01/23/14.  On 01/29/14, she underwent brain MRI on 4/9 showing 6 mm right posterior frontal parasagittal metastasis with surrounding edema.  She was started on dexamethasone and referred for possible radiotherapy.  PREVIOUS RADIATION THERAPY: No  PAST MEDICAL HISTORY:  has a past medical history of Depression; Back pain; Lung cancer; COPD (chronic obstructive pulmonary disease); Headache(784.0); GERD (gastroesophageal reflux disease); Renal cell cancer (Left); Frequency of urination; Urgency of urination; Nocturia; Hernia; Difficulty sleeping; Headache (10/21/2013); Adrenal insufficiency; Diabetes mellitus without complication; Hypothyroidism (01/30/2014); Cancer; and Brain cancer.    PAST SURGICAL HISTORY: Past Surgical History  Procedure Laterality Date  . Lung surgery  1985    RT UPPER LOBE REMOVED FOR CANCER - NOT [place]  . Breast surgery  1988    AUGMENTATION  . Back surgery  1985  . Abdominal hysterectomy  1970  . Hand surgery      LEFT  . Robot assisted laparoscopic nephrectomy Left 09/15/2013    Procedure: ROBOTIC ASSISTED LAPAROSCOPIC NEPHRECTOMY;  Surgeon: Alexis Frock, MD;  Location: [facility];  Service: Urology;  Laterality: Left;    FAMILY HISTORY: family history includes Cancer in her brother, maternal grandmother, mother, and sister; Heart disease in her father; Stroke in her father.  SOCIAL HISTORY:  reports that she has been smoking  Cigarettes.  She has a 54 pack-year smoking history. She has never used smokeless tobacco. She reports that she does not drink alcohol or use illicit drugs.  ALLERGIES: Penicillins and Codeine  MEDICATIONS:  Current Outpatient  Prescriptions  Medication Sig Dispense Refill  . dexamethasone (DECADRON) 4 MG tablet Take 1 tablet (4 mg total) by mouth 2 (two) times daily.  60 tablet  0  . lidocaine-prilocaine (EMLA) cream Apply 1 application topically as needed (port).      . metFORMIN (GLUCOPHAGE) 500 MG tablet Take 1 tablet (500 mg total) by mouth daily with breakfast.  30 tablet  1  . mirtazapine (REMERON) 15 MG tablet Take 1 tablet (15 mg total) by mouth at bedtime.  30 tablet  3  . omeprazole (PRILOSEC) 40 MG capsule Take 40 mg by mouth daily.      . Oxycodone HCl 10 MG TABS Take 1 tablet (10 mg total) by mouth every 3 (three) hours as needed (pain).  60 tablet  0  . PARoxetine (PAXIL) 20 MG tablet Take 20 mg by mouth every evening.      . polyethylene glycol (MIRALAX / GLYCOLAX) packet Take 17 g by mouth daily.      . promethazine (PHENERGAN) 25 MG tablet Take 25 mg by mouth every 6 (six) hours as needed for nausea.      . SUNItinib (SUTENT) 50 MG capsule Take 1 capsule (50 mg total) by mouth daily.  42 capsule  0  . predniSONE (DELTASONE) 10 MG tablet Take 5 mg by mouth daily. Tues and Thursday       No current facility-administered medications for this encounter.    REVIEW OF SYSTEMS:  A 15 point review of systems is documented in the electronic medical record. This was obtained by the nursing staff. However, I reviewed this with the patient to discuss relevant findings and make appropriate changes.  Pertinent items are noted in HPI.   PHYSICAL EXAM:  height is 5' 6.5" (1.689 m) and weight is 123 lb 1.6 oz (55.838 kg). Her temperature is 98.3 F (36.8 C). Her blood pressure is 112/70 and her pulse is 86. Her oxygen saturation is 100%.   Per medical oncology GENERAL:alert, no distress and comfortable. She looks thin and cachectic  SKIN: skin color, texture, turgor are normal, no rashes or significant lesions  EYES: normal, Conjunctiva are pink and non-injected, sclera clear  OROPHARYNX:no exudate, no erythema and  lips, buccal mucosa, and tongue normal  Musculoskeletal:no cyanosis of digits and no clubbing  NEURO: alert & oriented x 3 with fluent speech, no focal motor/sensory deficits  KPS = 90  100 - Normal; no complaints; no evidence of disease. 90   - Able to carry on normal activity; minor signs or symptoms of disease. 80   - Normal activity with effort; some signs or symptoms of disease. 69   - Cares for self; unable to carry on normal activity or to do active work. 60   - Requires occasional assistance, but is able to care for most of his personal needs. 50   - Requires considerable assistance and frequent medical care. 61   - Disabled; requires special care and assistance. 56   - Severely disabled; hospital admission is indicated although death not imminent. 27   - Very sick; hospital admission necessary; active supportive treatment necessary. 10   - Moribund; fatal processes progressing rapidly. 0     - Dead  Karnofsky DA, Abelmann [place], Craver LS and Burchenal  JH (1948) The use of the nitrogen mustards in the palliative treatment of carcinoma: with particular reference to bronchogenic carcinoma Cancer 1 634-56  LABORATORY DATA:  Lab Results  Component Value Date   WBC 13.8* 01/30/2014   HGB 10.6* 01/30/2014   HCT 32.6* 01/30/2014   MCV 78.2* 01/30/2014   PLT 361 01/30/2014   Lab Results  Component Value Date   NA 137 01/30/2014   K 4.4 01/30/2014   CL 99 10/23/2013   CO2 25 01/30/2014   Lab Results  Component Value Date   ALT <6 01/30/2014   AST 9 01/30/2014   ALKPHOS 101 01/30/2014   BILITOT 0.26 01/30/2014     RADIOGRAPHY: Mr Jeri Cos Wo Contrast  02/03/2014   CLINICAL DATA:  Lung cancer 1985. Renal Cell cancer November 2014. Stereotactic radiation surgery targeting exam. Metastatic lesion seen on prior exam.  EXAM: MRI HEAD WITHOUT AND WITH CONTRAST  TECHNIQUE: Multiplanar, multiecho pulse sequences of the brain and surrounding structures were obtained without and with intravenous  contrast.  CONTRAST:  57mL MULTIHANCE GADOBENATE DIMEGLUMINE 529 MG/ML IV SOLN  COMPARISON:  01/29/2014 MR.  FINDINGS: Solitary 7 mm ring-enhancing lesion medial superior aspect of the posterior right frontal lobe (motor strip) with surrounding vasogenic edema. No restricted motion.  No acute infarct.  No intracranial hemorrhage.  Scattered white matter type changes consistent with result of small vessel disease.  Atrophy without hydrocephalus.  Major intracranial vascular structures are patent.  Partial opacification mastoid air cells greater on the left. No obstructing lesion posterior superior nasopharynx seen causing eustachian tube dysfunction. Left sphenoid sinus air cell moderate mucosal thickening. Mild mucosal thickening ethmoid sinus air cells.  Transverse ligament hypertrophy.  IMPRESSION: Solitary 7 mm ring-enhancing lesion medial superior aspect of the posterior right frontal lobe (motor strip) with surrounding vasogenic edema (series 10, image 134).  Scattered white matter type changes consistent with result of small vessel disease.  Atrophy without hydrocephalus.   Electronically Signed   By: Chauncey Cruel M.D.   On: 02/03/2014 15:17   Mr Jeri Cos NI Contrast  01/29/2014   CLINICAL DATA:  Renal cell carcinoma.  Dizziness.  EXAM: MRI HEAD WITHOUT AND WITH CONTRAST  TECHNIQUE: Multiplanar, multiecho pulse sequences of the brain and surrounding structures were obtained without and with intravenous contrast.  CONTRAST:  68mL MULTIHANCE GADOBENATE DIMEGLUMINE 529 MG/ML IV SOLN  COMPARISON:  10/15/2013.  FINDINGS: New 6 mm right posterior frontal parasagittal metastasis with surrounding edema. Slight central necrosis. No blood products. Correlate clinically as a cause for dizziness. No other definite metastatic lesions. Stable patchy areas of T2 and FLAIR hyperintensity in the white matter consistent with chronic microvascular ischemic change. Mild atrophy. Bilateral cataract extraction. No sinus or mastoid  disease. Flow voids are maintained. No definite osseous lesions. Slight dependent mastoid fluid on the left.  Compared with prior MR, the metastasis is a new finding.  Scanning  IMPRESSION: New 6 mm right posterior frontal parasagittal metastasis with surrounding edema. Findings discussed with ordering provider who provided instructions for the patient to proceed today to the cancer center.   Electronically Signed   By: Rolla Flatten M.D.   On: 01/29/2014 13:23      IMPRESSION: This patient is a very nice 73 year old woman with a solitary 7 mm right frontal brain metastasis from metastatic renal cell carcinoma. At this point, the patient would potentially benefit from radiotherapy. The options include whole brain irradiation versus stereotactic radiosurgery. There are pros and cons  associated with each of these potential treatment options. Whole brain radiotherapy would treat the known metastatic deposit and help provide some reduction of risk for future brain metastases. However, whole brain radiotherapy carries potential risks including hair loss, subacute somnolence, and neurocognitive changes including a possible reduction in short-term memory. Whole brain radiotherapy also may carry a lower likelihood of tumor control at the treatment sites because of the low-dose used. Stereotactic radiosurgery carries a higher likelihood for local tumor control at the targeted sites with lower associated risk for neurocognitive changes such as memory loss. However, the use of stereotactic radiosurgery in this setting may leave the patient at increased risk for new brain metastases elsewhere in the brain as high as 50-60%. Accordingly, patients who receive stereotactic radiosurgery in this setting should undergo ongoing surveillance imaging with brain MRI more frequently in order to identify and treat new small brain metastases before they become symptomatic. Stereotactic radiosurgery does carry some different risks,  including a risk of radionecrosis.  PLAN: Today, I reviewed the findings and workup thus far with the patient. We discussed the dilemma regarding whole brain radiotherapy versus stereotactic radiosurgery. We discussed the pros and cons of each. We also discussed the logistics and delivery of each. We reviewed the results associated with each of the treatments described above. The patient seems to understand the treatment options and would like to proceed with stereotactic radiosurgery.  I spent 60 minutes minutes face to face with the patient and more than 50% of that time was spent in counseling and/or coordination of care.    ------------------------------------------------  Sheral Apley. Tammi Klippel, M.D.

## 2014-02-05 ENCOUNTER — Ambulatory Visit
Admission: RE | Admit: 2014-02-05 | Discharge: 2014-02-05 | Disposition: A | Discharge status: Home / Self Care | Payer: Medicare HMO | Source: Ambulatory Visit | Attending: Radiation Oncology | Admitting: Radiation Oncology

## 2014-02-05 ENCOUNTER — Telehealth: Payer: Self-pay

## 2014-02-05 ENCOUNTER — Telehealth: Payer: Self-pay | Admitting: *Deleted

## 2014-02-05 VITALS — BP 133/73 | HR 79 | Resp 16 | Ht 67.0 in | Wt 123.0 lb

## 2014-02-05 DIAGNOSIS — C7931 Secondary malignant neoplasm of brain: Secondary | ICD-10-CM

## 2014-02-05 DIAGNOSIS — Z510 Encounter for antineoplastic radiation therapy: Secondary | ICD-10-CM | POA: Insufficient documentation

## 2014-02-05 DIAGNOSIS — C7949 Secondary malignant neoplasm of other parts of nervous system: Secondary | ICD-10-CM

## 2014-02-05 DIAGNOSIS — C649 Malignant neoplasm of unspecified kidney, except renal pelvis: Secondary | ICD-10-CM | POA: Insufficient documentation

## 2014-02-05 NOTE — Progress Notes (Signed)
Patient accompanied today by her sister, Coralyn Mark. Patient denies that she took her metformin this morning. Patient reports dizziness when she lays flat. Accessed right subclavian power port on the first attempt. Excellent blood return. Secure access. Escorted patient and her sister across the hall to simulation room. At 0912 removed power port access following completion of scan. Needle intact upon removal. Patient tolerated well. Applied a bandaid to old access site.

## 2014-02-05 NOTE — Telephone Encounter (Signed)
Spoke with nancy powell patient's sister to bring patient on Sat. Between 8 and 10 am for bun and creatinine before resuming metformin.

## 2014-02-05 NOTE — Telephone Encounter (Signed)
Patient needs bun and creatinine scheduled for Saturday morning 02/07/14. at [facility] long lab prior to resuming metformin.patient will be call on Friday to know what time to have drawn.

## 2014-02-05 NOTE — Telephone Encounter (Signed)
Sister states she missed a call from [facility], maybe from Dr. Johny Shears office?  S/w Val in [place] and she will call sister back.

## 2014-02-06 ENCOUNTER — Other Ambulatory Visit: Payer: Self-pay | Admitting: Radiation Oncology

## 2014-02-06 DIAGNOSIS — C7949 Secondary malignant neoplasm of other parts of nervous system: Secondary | ICD-10-CM

## 2014-02-06 DIAGNOSIS — C719 Malignant neoplasm of brain, unspecified: Secondary | ICD-10-CM

## 2014-02-06 DIAGNOSIS — E119 Type 2 diabetes mellitus without complications: Secondary | ICD-10-CM

## 2014-02-06 DIAGNOSIS — C7931 Secondary malignant neoplasm of brain: Secondary | ICD-10-CM

## 2014-02-09 ENCOUNTER — Ambulatory Visit (INDEPENDENT_AMBULATORY_CARE_PROVIDER_SITE_OTHER): Payer: Medicare HMO | Admitting: Surgery

## 2014-02-09 ENCOUNTER — Ambulatory Visit: Payer: Medicare HMO | Admitting: Radiation Oncology

## 2014-02-09 ENCOUNTER — Telehealth: Payer: Self-pay | Admitting: Radiation Oncology

## 2014-02-09 NOTE — Telephone Encounter (Signed)
Received call from Jed Limerick, and Tammy of [place] lab reporting BUN of 30 and creatinine 0.94 drawn on Sunday. Informed Dr. Tammi Klippel of these findings. She advised this Probation officer contact patient and advise her to resume her Metformin. No answer when called patient but, left her a message. Then, call her sister, Izora Gala. Explained to [place] her sister needed to resume her metformin and increase her fluid intake. She verbalized understanding and plans to get up with her sister to lives near by.

## 2014-02-11 ENCOUNTER — Encounter: Payer: Self-pay | Admitting: Radiation Oncology

## 2014-02-11 ENCOUNTER — Ambulatory Visit
Admission: RE | Admit: 2014-02-11 | Discharge: 2014-02-11 | Disposition: A | Discharge status: Home / Self Care | Payer: Medicare HMO | Source: Ambulatory Visit | Attending: Radiation Oncology | Admitting: Radiation Oncology

## 2014-02-11 VITALS — BP 111/70 | HR 79 | Temp 97.7°F | Resp 20

## 2014-02-11 DIAGNOSIS — C7931 Secondary malignant neoplasm of brain: Secondary | ICD-10-CM

## 2014-02-11 DIAGNOSIS — C7949 Secondary malignant neoplasm of other parts of nervous system: Principal | ICD-10-CM

## 2014-02-11 DIAGNOSIS — C649 Malignant neoplasm of unspecified kidney, except renal pelvis: Secondary | ICD-10-CM

## 2014-02-11 NOTE — Op Note (Addendum)
Stereotactic Radiosurgery Operative Note  Name: Kaylee [name] MRN: [id number]  Date: 02/11/2014  DOB: 1941/08/28  Op Note  Pre Operative Diagnosis:  Right posterior frontal brain metastasis  Post Operative Diagnois:  Right posterior frontal brain metastasis  3D TREATMENT PLANNING AND DOSIMETRY:  The patient's radiation plan was reviewed and approved by myself (neurosurgery) and Dr. Ledon Snare (radiation oncology) prior to treatment.  It showed 3-dimensional radiation distributions overlaid onto the planning CT/MRI image set.  [facility] for the target structures as well as the organs at risk were reviewed. The documentation of the 3D plan and dosimetry are filed in the radiation oncology EMR.  NARRATIVE:  Kaylee [name] was brought to the TrueBeam stereotactic radiation treatment machine and placed supine on the CT couch. The head frame was applied, and the patient was set up for stereotactic radiosurgery.  I was present for the set-up and delivery.  SIMULATION VERIFICATION:  In the couch zero-angle position, the patient underwent Exactrac imaging using the Brainlab system with orthogonal KV images.  These were carefully aligned and repeated to confirm treatment position for each of the isocenters.  The Exactrac snap film verification was repeated at each couch angle.  SPECIAL TREATMENT PROCEDURE: Kaylee [name] received stereotactic radiosurgery to the following targets: Right posterior frontal target was treated using 3 Circular Arcs to a prescription dose of 20 Gy.  ExacTrac registration was performed for each couch angle.  The 72.5% isodose line was prescribed.  STEREOTACTIC TREATMENT MANAGEMENT:  Following delivery, the patient was transported to nursing in stable condition and monitored for possible acute effects.  Vital signs were recorded BP 150/76  Pulse 82  Temp(Src) 97.7 F (36.5 C) (Oral)  Resp 16  SpO2 100%. The patient tolerated treatment without significant  acute effects, and was discharged to home in stable condition.    PLAN: Follow-up in one month.

## 2014-02-11 NOTE — Progress Notes (Signed)
Patient resting quietly in recliner. VS stable. She denies pain, HA, nausea, dizziness, vision changes. Dr Tammi Klippel notified.

## 2014-02-11 NOTE — Progress Notes (Signed)
Vitals stable. Patient denies pain. Denies headache, dizziness, nausea, diplopia or ringing in the ears. She reports taking decadron 4 mg bid. She understands Dr. Tammi Klippel will advise her about decadron taper before she leaves today. She has been given a one month follow up appointment card by Marcine Matar. She understands to contact staff with needs. Offered patient and family drink but, all refused.

## 2014-02-11 NOTE — Progress Notes (Addendum)
  Radiation Oncology         [phone] ________________________________  Stereotactic Treatment Procedure Note  Name: KATLYN MULDREW MRN: [id number]  Date: 02/11/2014   DOB: 21-Apr-1941  SPECIAL TREATMENT PROCEDURE  3D TREATMENT PLANNING AND DOSIMETRY:  The patient's radiation plan was reviewed and approved by neurosurgery and radiation oncology prior to treatment.  It showed 3-dimensional radiation distributions overlaid onto the planning CT/MRI image set.  The [facility] for the target structures as well as the organs at risk were reviewed. The documentation of the 3D plan and dosimetry are filed in the radiation oncology EMR.  NARRATIVE:  Amenah M Music was brought to the TrueBeam stereotactic radiation treatment machine and placed supine on the CT couch. The head frame was applied, and the patient was set up for stereotactic radiosurgery.  Dr. Sherwood Gambler from Neurosurgery was present for the set-up and delivery  SIMULATION VERIFICATION:  In the couch zero-angle position, the patient underwent Exactrac imaging using the Brainlab system with orthogonal KV images.  These were carefully aligned and repeated to confirm treatment position for each of the isocenters.  The Exactrac snap film verification was repeated at each couch angle.  SPECIAL TREATMENT PROCEDURE: Emylie M Kamen received stereotactic radiosurgery to the following targets: Right frontal 7 mm target was treated using 3 Circular Arcs to a prescription dose of 20 Gy.  ExacTrac registration was performed for each couch angle.  The 72.5% isodose line was prescribed.  10 mm collimators were used for all fields.  STEREOTACTIC TREATMENT MANAGEMENT:  Following delivery, the patient was transported to nursing in stable condition and monitored for possible acute effects.  Vital signs were recorded BP 111/70  Pulse 79  Temp(Src) 97.7 F (36.5 C) (Oral)  Resp 20  SpO2 100%. The patient tolerated treatment without significant  acute effects, and was discharged to home in stable condition.    PLAN: Follow-up in one month.  ________________________________  Sheral Apley. Tammi Klippel, M.D.

## 2014-02-12 ENCOUNTER — Telehealth: Payer: Self-pay | Admitting: *Deleted

## 2014-02-12 ENCOUNTER — Telehealth: Payer: Self-pay | Admitting: Radiation Oncology

## 2014-02-12 NOTE — Telephone Encounter (Signed)
Pt returned call from [place], [place]. Gave pt Dexamethasone taper: Dexamethasone 4 mg Continue  4 mg BID for 1 week, then 2 mg BID for 1 week, then 2 mg daily for 1 week, then stop. Pt read back taper correctly.  Pt states when she woke up today she was nauseated, but "it went away". She did not take any medication for the nausea. She denies pain, HA, dizziness, blurred vision. Advised she call back if she develops any problems. Pt verbalized understanding.

## 2014-02-12 NOTE — Telephone Encounter (Signed)
Left message to assess patient status today following treatment. No answer left message and requested return call. Also, requested return call to confirm understanding of decadron taper.

## 2014-02-13 ENCOUNTER — Encounter: Payer: Self-pay | Admitting: *Deleted

## 2014-02-13 NOTE — Progress Notes (Unsigned)
[place] Psychosocial Distress Screening Clinical Social Work  Clinical Social Work was referred by distress screening protocol.  The patient scored a 6 on the Psychosocial Distress Thermometer which indicates moderate distress. Clinical Social Worker contacted patient to assess for distress and other psychosocial needs.   ONCBCN DISTRESS SCREENING 02/04/2014  Screening Type Initial Screening  Elta [place] the number that describes how much distress you have been experiencing in the past week 6  Emotional problem type Depression;Nervousness/Anxiety;Adjusting to illness;Adjusting to appearance changes  Physical Problem type Pain;Loss of appetitie  Physician notified of physical symptoms Yes    Clinical Social Worker follow up needed: no  If yes, follow up plan:   Polo Riley, MSW, LCSW, OSW-C Clinical Social Worker [facility] [phone]

## 2014-02-14 ENCOUNTER — Other Ambulatory Visit: Payer: Self-pay | Admitting: Hematology and Oncology

## 2014-02-15 NOTE — Progress Notes (Signed)
  Radiation Oncology         [phone] ________________________________  Name: Kaylee [name] MRN: [id number]  Date: 02/11/2014   DOB: [birth year]  End of Treatment Note  Diagnosis:   73 year old woman with a 7 mm solitary right frontal brain metastasis from metastatic renal cell carcinoma-stage IV  Indication for treatment:  Palliation       Radiation treatment dates:   02/11/2014  Site/dose/beams/energy:   [name] received stereotactic radiosurgery to the her left frontal 7 mm target which was treated using 3 Circular Arcs to a prescription dose of 20 Gy. ExacTrac registration was performed for each couch angle. The 72.5% isodose line was prescribed. 10 mm collimators were used for all fields.  Narrative: The patient tolerated radiation treatment relatively well.   No complications occurred.  Plan: The patient has completed radiation treatment. The patient will return to radiation oncology clinic for routine followup in one month. I advised them to call or return sooner if they have any questions or concerns related to their recovery or treatment. ________________________________  Sheral Apley. Tammi Klippel, M.D.

## 2014-02-20 ENCOUNTER — Telehealth: Payer: Self-pay | Admitting: *Deleted

## 2014-02-20 ENCOUNTER — Ambulatory Visit (HOSPITAL_BASED_OUTPATIENT_CLINIC_OR_DEPARTMENT_OTHER): Payer: Commercial Managed Care - HMO | Admitting: Hematology and Oncology

## 2014-02-20 ENCOUNTER — Other Ambulatory Visit (HOSPITAL_BASED_OUTPATIENT_CLINIC_OR_DEPARTMENT_OTHER): Payer: Commercial Managed Care - HMO

## 2014-02-20 ENCOUNTER — Telehealth: Payer: Self-pay | Admitting: Hematology and Oncology

## 2014-02-20 VITALS — BP 130/76 | HR 82 | Temp 97.5°F | Resp 18 | Ht 67.0 in | Wt 128.8 lb

## 2014-02-20 DIAGNOSIS — C7800 Secondary malignant neoplasm of unspecified lung: Secondary | ICD-10-CM

## 2014-02-20 DIAGNOSIS — C7931 Secondary malignant neoplasm of brain: Secondary | ICD-10-CM

## 2014-02-20 DIAGNOSIS — R630 Anorexia: Secondary | ICD-10-CM

## 2014-02-20 DIAGNOSIS — C7949 Secondary malignant neoplasm of other parts of nervous system: Secondary | ICD-10-CM

## 2014-02-20 DIAGNOSIS — E119 Type 2 diabetes mellitus without complications: Secondary | ICD-10-CM

## 2014-02-20 DIAGNOSIS — E039 Hypothyroidism, unspecified: Secondary | ICD-10-CM

## 2014-02-20 DIAGNOSIS — C787 Secondary malignant neoplasm of liver and intrahepatic bile duct: Secondary | ICD-10-CM

## 2014-02-20 DIAGNOSIS — G4700 Insomnia, unspecified: Secondary | ICD-10-CM

## 2014-02-20 DIAGNOSIS — C649 Malignant neoplasm of unspecified kidney, except renal pelvis: Secondary | ICD-10-CM

## 2014-02-20 DIAGNOSIS — R42 Dizziness and giddiness: Secondary | ICD-10-CM

## 2014-02-20 DIAGNOSIS — E43 Unspecified severe protein-calorie malnutrition: Secondary | ICD-10-CM

## 2014-02-20 LAB — COMPREHENSIVE METABOLIC PANEL (CC13)
ALBUMIN: 2.7 g/dL — AB (ref 3.5–5.0)
ALK PHOS: 154 U/L — AB (ref 40–150)
ALT: 31 U/L (ref 0–55)
ANION GAP: 8 meq/L (ref 3–11)
AST: 12 U/L (ref 5–34)
BUN: 31.3 mg/dL — ABNORMAL HIGH (ref 7.0–26.0)
CHLORIDE: 104 meq/L (ref 98–109)
CO2: 24 mEq/L (ref 22–29)
Calcium: 9.7 mg/dL (ref 8.4–10.4)
Creatinine: 1.2 mg/dL — ABNORMAL HIGH (ref 0.6–1.1)
Glucose: 201 mg/dl — ABNORMAL HIGH (ref 70–140)
POTASSIUM: 4.5 meq/L (ref 3.5–5.1)
SODIUM: 137 meq/L (ref 136–145)
TOTAL PROTEIN: 6.4 g/dL (ref 6.4–8.3)
Total Bilirubin: 0.37 mg/dL (ref 0.20–1.20)

## 2014-02-20 LAB — CBC WITH DIFFERENTIAL/PLATELET
BASO%: 0.3 % (ref 0.0–2.0)
Basophils Absolute: 0 10*3/uL (ref 0.0–0.1)
EOS%: 1.6 % (ref 0.0–7.0)
Eosinophils Absolute: 0.1 10*3/uL (ref 0.0–0.5)
HCT: 32.1 % — ABNORMAL LOW (ref 34.8–46.6)
HGB: 10.4 g/dL — ABNORMAL LOW (ref 11.6–15.9)
LYMPH#: 1.3 10*3/uL (ref 0.9–3.3)
LYMPH%: 27.1 % (ref 14.0–49.7)
MCH: 26.3 pg (ref 25.1–34.0)
MCHC: 32.2 g/dL (ref 31.5–36.0)
MCV: 81.6 fL (ref 79.5–101.0)
MONO#: 0.3 10*3/uL (ref 0.1–0.9)
MONO%: 7.1 % (ref 0.0–14.0)
NEUT%: 63.9 % (ref 38.4–76.8)
NEUTROS ABS: 3.1 10*3/uL (ref 1.5–6.5)
Platelets: 120 10*3/uL — ABNORMAL LOW (ref 145–400)
RBC: 3.94 10*6/uL (ref 3.70–5.45)
RDW: 23.4 % — AB (ref 11.2–14.5)
WBC: 4.8 10*3/uL (ref 3.9–10.3)

## 2014-02-20 LAB — TSH CHCC: TSH: 2.276 m(IU)/L (ref 0.308–3.960)

## 2014-02-20 LAB — T4, FREE: FREE T4: 0.85 ng/dL (ref 0.80–1.80)

## 2014-02-20 NOTE — Progress Notes (Signed)
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, liver and brain  SUMMARY OF ONCOLOGIC HISTORY: Oncology History   Renal cell carcinoma   Primary site: Kidney (Left)   Staging method: AJCC 7th Edition   Clinical: Stage IV (T3a, N0, M1) signed by Heath Lark, MD on 10/21/2013  8:46 AM   Pathologic: Stage IV (T3a, N0, M1) signed by Heath Lark, MD on 10/21/2013  8:47 AM   Summary: Stage IV (T3a, N0, M1)       Renal cell carcinoma   10/30/2010 Imaging Negative CT abdomen   09/15/2013 Surgery Patient underwent left nephrectomy which confirmed RCC, Fuhrman grade III/IV   09/27/2013 Imaging CT abdomen showed liver metastasis   09/29/2013 Procedure CT guided liver biopsy confrimed metastatic disease   09/29/2013 Imaging CT chest showed enlarging pulmonary metastases   09/30/2013 - 10/10/2013 Hospital Admission Admitted for orthostatic hypotension and headaches and severe malnutrition   10/21/2013 - 10/23/2013 Hospital Admission She was admitted to the hospital with recurrent urinary tract infection, dehydration, severe headaches and adrenal insufficiency.   11/07/2013 - 12/26/2013 Chemotherapy The patient begun palliative chemotherapy with weekly Temsirolimus   01/01/2014 Imaging Repeat CT scan show mixed response to treatment.   01/23/2014 -  Chemotherapy The patient was started on sunitinib.   01/29/2014 Imaging MRI of the head show a new brain lesion   02/11/2014 -  Radiation Therapy She received one dose of stereotactic radiation therapy to the brain metastasis    INTERVAL HISTORY: Kaylee [name] 73 y.o. female returns for further followup. She has completed radiation therapy to the brain. She has gained 10 pounds of weight since she was placed on higher dose dexamethasone. She has dizziness and recurrent falls  at home but no major injuries. She tolerated chemotherapy well. The patient denies any mouth sores, nausea, vomiting or change in bowel habits  I have reviewed the past medical history, past surgical history, social history and family history with the patient and they are unchanged from previous note.  ALLERGIES:  is allergic to penicillins and codeine.  MEDICATIONS:  Current Outpatient Prescriptions  Medication Sig Dispense Refill  . dexamethasone (DECADRON) 4 MG tablet Take 1 tablet (4 mg total) by mouth 2 (two) times daily.  60 tablet  0  . lidocaine-prilocaine (EMLA) cream Apply 1 application topically as needed (port).      . metFORMIN (GLUCOPHAGE) 500 MG tablet TAKE 1 TABLET (500 MG TOTAL) BY MOUTH DAILY WITH BREAKFAST.  30 tablet  1  . mirtazapine (REMERON) 15 MG tablet Take 1 tablet (15 mg total) by mouth at bedtime.  30 tablet  3  . omeprazole (PRILOSEC) 40 MG capsule Take 40 mg by mouth daily.      . Oxycodone HCl 10 MG TABS Take 1 tablet (10 mg total) by mouth every 3 (three) hours as needed (pain).  60 tablet  0  . PARoxetine (PAXIL) 20 MG tablet Take 20 mg by mouth every evening.      . polyethylene glycol (MIRALAX / GLYCOLAX) packet Take 17 g by mouth daily.      . predniSONE (DELTASONE) 10 MG tablet Take 5 mg by mouth daily. Tues and Thursday      . promethazine (PHENERGAN) 25 MG tablet Take 25 mg by mouth every 6 (six) hours as needed for nausea.      Marland Kitchen  SUNItinib (SUTENT) 50 MG capsule Take 1 capsule (50 mg total) by mouth daily.  42 capsule  0   No current facility-administered medications for this visit.    REVIEW OF SYSTEMS:   Constitutional: Denies fevers, chills or abnormal weight loss Eyes: Denies blurriness of vision Ears, nose, mouth, throat, and face: Denies mucositis or sore throat Respiratory: Denies cough, dyspnea or wheezes Cardiovascular: Denies palpitation, chest discomfort or lower extremity swelling Gastrointestinal:  Denies nausea, heartburn or change  in bowel habits Skin: Denies abnormal skin rashes Lymphatics: Denies new lymphadenopathy or easy bruising Neurological:Denies numbness, tingling Behavioral/Psych: Mood is stable, no new changes  All other systems were reviewed with the patient and are negative.  PHYSICAL EXAMINATION: ECOG PERFORMANCE STATUS: 2 - Symptomatic, <50% confined to bed  Filed Vitals:   02/20/14 1220  BP: 130/76  Pulse: 82  Temp: 97.5 F (36.4 C)  Resp: 18   Filed Weights   02/20/14 1220  Weight: 128 lb 12.8 oz (58.423 kg)    GENERAL:alert, no distress and comfortable. She looks thin but not cachectic SKIN: skin color, texture, turgor are normal, no rashes or significant lesions EYES: normal, Conjunctiva are pink and non-injected, sclera clear OROPHARYNX:no exudate, no erythema and lips, buccal mucosa, and tongue normal  NECK: supple, thyroid normal size, non-tender, without nodularity LYMPH:  no palpable lymphadenopathy in the cervical, axillary or inguinal LUNGS: clear to auscultation and percussion with normal breathing effort HEART: regular rate & rhythm and no murmurs and no lower extremity edema ABDOMEN:abdomen soft, non-tender and normal bowel sounds Musculoskeletal:no cyanosis of digits and no clubbing  NEURO: alert & oriented x 3 with fluent speech, no focal motor/sensory deficits  LABORATORY DATA:  I have reviewed the data as listed    Component Value Date/Time   NA 137 02/20/2014 1201   NA 135* 10/23/2013 0409   K 4.5 02/20/2014 1201   K 4.6 10/23/2013 0409   CL 99 10/23/2013 0409   CO2 24 02/20/2014 1201   CO2 26 10/23/2013 0409   GLUCOSE 201* 02/20/2014 1201   GLUCOSE 215* 10/23/2013 0409   BUN 31.3* 02/20/2014 1201   BUN 16 10/23/2013 0409   CREATININE 1.2* 02/20/2014 1201   CREATININE 0.74 10/23/2013 0409   CALCIUM 9.7 02/20/2014 1201   CALCIUM 9.0 10/23/2013 0409   PROT 6.4 02/20/2014 1201   PROT 7.0 10/15/2013 1316   ALBUMIN 2.7* 02/20/2014 1201   ALBUMIN 3.5 10/15/2013 1316   AST 12 02/20/2014 1201    AST 12 10/15/2013 1316   ALT 31 02/20/2014 1201   ALT 10 10/15/2013 1316   ALKPHOS 154* 02/20/2014 1201   ALKPHOS 81 10/15/2013 1316   BILITOT 0.37 02/20/2014 1201   BILITOT 0.6 10/15/2013 1316   GFRNONAA 82* 10/23/2013 0409   GFRAA >90 10/23/2013 0409    No results found for this basename: SPEP,  UPEP,   kappa and lambda light chains    Lab Results  Component Value Date   WBC 4.8 02/20/2014   NEUTROABS 3.1 02/20/2014   HGB 10.4* 02/20/2014   HCT 32.1* 02/20/2014   MCV 81.6 02/20/2014   PLT 120 Few Large & giant platelets* 02/20/2014      Chemistry      Component Value Date/Time   NA 137 02/20/2014 1201   NA 135* 10/23/2013 0409   K 4.5 02/20/2014 1201   K 4.6 10/23/2013 0409   CL 99 10/23/2013 0409   CO2 24 02/20/2014 1201   CO2 26 10/23/2013 0409  BUN 31.3* 02/20/2014 1201   BUN 16 10/23/2013 0409   CREATININE 1.2* 02/20/2014 1201   CREATININE 0.74 10/23/2013 0409      Component Value Date/Time   CALCIUM 9.7 02/20/2014 1201   CALCIUM 9.0 10/23/2013 0409   ALKPHOS 154* 02/20/2014 1201   ALKPHOS 81 10/15/2013 1316   AST 12 02/20/2014 1201   AST 12 10/15/2013 1316   ALT 31 02/20/2014 1201   ALT 10 10/15/2013 1316   BILITOT 0.37 02/20/2014 1201   BILITOT 0.6 10/15/2013 1316     ASSESSMENT & PLAN:  #1 metastatic renal cell carcinoma She tolerated Sutent well so far. Continue supportive care. After she completes this cycle, she will get 2 weeks break and restart next cycle on 03/20/2014. I will see her then. #2 dizziness #3 recent fall Her blood pressure is borderline low. I recommend she increase oral intake The patient has a walker at home. I recommend consideration for bedside commode. #4 brain metastasis status post radiation treatment I recommend she reduce dexamethasone to 2 mg twice a day stopped this weekend and then reduce further to once a day until I see her back. #5 poorly controlled diabetes We will continue monitor her blood sugar carefully. recommend she call her primary care provider as  expected her blood sugar control will be worse with increased dose of dexamethasone   #6 protein calorie malnutrition Hopefully with increased dexamethasone she would have better appetite. Thankfully she has gained 10 pounds of weight. #7 insomnia and anorexia She is doing well on Remeron   Orders Placed This Encounter  Procedures  . CBC with Differential    Standing Status: Future     Number of Occurrences:      Standing Expiration Date: 02/20/2015  . Comprehensive metabolic panel    Standing Status: Future     Number of Occurrences:      Standing Expiration Date: 02/20/2015  . T4, free    Standing Status: Future     Number of Occurrences:      Standing Expiration Date: 02/20/2015  . TSH    Standing Status: Future     Number of Occurrences:      Standing Expiration Date: 02/20/2015   All questions were answered. The patient knows to call the clinic with any problems, questions or concerns. No barriers to learning was detected. I spent 40 minutes counseling the patient face to face. The total time spent in the appointment was 55 minutes and more than 50% was on counseling and review of test results     Heath Lark, MD 02/20/2014 2:48 PM

## 2014-02-20 NOTE — Telephone Encounter (Signed)
Informed pt of elevated Creatinine indicates dehydration and Dr. Alvy Bimler instructs for pt to push fluids. Instructed to drink 2 liters non caffeine fluids per day.  Also instructed on keeping a check on her blood sugar as this was also elevated.  Informed that dexamethasone can cause her blood sugar to go up and important to keep a close eye on them and notify PCP if sugar not controlled. She verbalized understanding.

## 2014-02-20 NOTE — Telephone Encounter (Signed)
gve the pt her may 2015 appt calendar

## 2014-02-26 ENCOUNTER — Ambulatory Visit
Admission: RE | Admit: 2014-02-26 | Discharge: 2014-02-26 | Disposition: A | Discharge status: Home / Self Care | Payer: Medicare HMO | Source: Ambulatory Visit | Attending: Radiation Oncology | Admitting: Radiation Oncology

## 2014-02-26 DIAGNOSIS — C7931 Secondary malignant neoplasm of brain: Secondary | ICD-10-CM

## 2014-02-26 DIAGNOSIS — C7949 Secondary malignant neoplasm of other parts of nervous system: Principal | ICD-10-CM

## 2014-02-27 NOTE — Progress Notes (Signed)
Kaylee [name] and her sister Clarene Critchley both attended the brain tumor support group

## 2014-03-01 NOTE — Addendum Note (Signed)
Encounter addended by: Lora Paula, MD on: 03/01/2014  3:13 PM<BR>     Documentation filed: Notes Section

## 2014-03-18 ENCOUNTER — Ambulatory Visit
Admission: RE | Admit: 2014-03-18 | Discharge: 2014-03-18 | Disposition: A | Discharge status: Home / Self Care | Payer: Medicare HMO | Source: Ambulatory Visit | Attending: Radiation Oncology | Admitting: Radiation Oncology

## 2014-03-18 ENCOUNTER — Encounter: Payer: Self-pay | Admitting: Radiation Oncology

## 2014-03-18 VITALS — BP 96/58 | HR 83 | Resp 16 | Wt 124.8 lb

## 2014-03-18 DIAGNOSIS — C7949 Secondary malignant neoplasm of other parts of nervous system: Principal | ICD-10-CM

## 2014-03-18 DIAGNOSIS — C7931 Secondary malignant neoplasm of brain: Secondary | ICD-10-CM

## 2014-03-18 NOTE — Progress Notes (Signed)
  Radiation Oncology         [phone] ________________________________  Name: Kaylee [name] MRN: [id number]  Date: 03/18/2014  DOB: 08-Mar-1941  Follow-Up Visit Note  CC: [name], MD  [name], MD  Diagnosis:   72 year old woman with a solitary right frontal brain metastasis from metastatic renal cell carcinoma-stage IV s/p radiosurgery on 02/11/2014 to the her left frontal 7 mm target which was treated using 3 Circular Arcs to a prescription dose of 20 Gy.  Interval Since Last Radiation:  4  weeks  Narrative:  The patient returns today for routine follow-up.  Reports frequency of headaches have decreased. Reports an occasional headache behind her eyes that resolves with Excedrin migraine. Reports her biggest concern is being off balance. Patient ambulating with a cane. Reports occasional ringing in the ears continues but, isn't worse or more frequent than, "its always been." Denies diplopia, nausea, or vomiting. Vitals stable. Denies pain. Taking decadron 2 mg each morning. Follows up with Alvy Bimler on Friday. Resumes Sutent this week                              ALLERGIES:  is allergic to penicillins and codeine.  Meds: Current Outpatient Prescriptions  Medication Sig Dispense Refill  . dexamethasone (DECADRON) 4 MG tablet Take 1 tablet (4 mg total) by mouth 2 (two) times daily.  60 tablet  0  . lidocaine-prilocaine (EMLA) cream Apply 1 application topically as needed (port).      . metFORMIN (GLUCOPHAGE) 500 MG tablet TAKE 1 TABLET (500 MG TOTAL) BY MOUTH DAILY WITH BREAKFAST.  30 tablet  1  . mirtazapine (REMERON) 15 MG tablet Take 1 tablet (15 mg total) by mouth at bedtime.  30 tablet  3  . omeprazole (PRILOSEC) 40 MG capsule Take 40 mg by mouth daily.      Marland Kitchen PARoxetine (PAXIL) 20 MG tablet Take 20 mg by mouth every evening.      . polyethylene glycol (MIRALAX / GLYCOLAX) packet Take 17 g by mouth daily.      . SUNItinib (SUTENT) 50 MG capsule Take 1 capsule (50 mg  total) by mouth daily.  42 capsule  0  . Oxycodone HCl 10 MG TABS Take 1 tablet (10 mg total) by mouth every 3 (three) hours as needed (pain).  60 tablet  0  . predniSONE (DELTASONE) 10 MG tablet Take 5 mg by mouth daily. Tues and Thursday      . promethazine (PHENERGAN) 25 MG tablet Take 25 mg by mouth every 6 (six) hours as needed for nausea.       No current facility-administered medications for this encounter.    Physical Findings: The patient is in no acute distress. Patient is alert and oriented.  weight is 124 lb 12.8 oz (56.609 kg). Her blood pressure is 96/58 and her pulse is 83. Her respiration is 16. .  No significant changes.  Lab Findings: Lab Results  Component Value Date   WBC 4.8 02/20/2014   HGB 10.4* 02/20/2014   HCT 32.1* 02/20/2014   MCV 81.6 02/20/2014   PLT 120 Few Large & giant platelets* 02/20/2014    Impression:  The patient is recovering from the effects of radiation.    Plan:  MRI in 2 months then follow-up  _____________________________________  Sheral Apley. Tammi Klippel, M.D.

## 2014-03-18 NOTE — Progress Notes (Signed)
Reports frequency of headaches have decreased. Reports an occasional headache behind her eyes that resolves with Excedrin migraine. Reports her biggest concern is being off balance. Patient ambulating with a cane. Reports occasional ringing in the ears continues but, isn't worse or more frequent than, "its always been." Denies diplopia, nausea, or vomiting. Vitals stable. Denies pain. Taking decadron 2 mg each morning. Follows up with Alvy Bimler on Friday. Resumes Sutent this week.

## 2014-03-20 ENCOUNTER — Other Ambulatory Visit (HOSPITAL_BASED_OUTPATIENT_CLINIC_OR_DEPARTMENT_OTHER): Payer: Commercial Managed Care - HMO

## 2014-03-20 ENCOUNTER — Encounter: Payer: Self-pay | Admitting: Hematology and Oncology

## 2014-03-20 ENCOUNTER — Telehealth: Payer: Self-pay | Admitting: Hematology and Oncology

## 2014-03-20 ENCOUNTER — Ambulatory Visit (HOSPITAL_BASED_OUTPATIENT_CLINIC_OR_DEPARTMENT_OTHER): Payer: Commercial Managed Care - HMO | Admitting: Hematology and Oncology

## 2014-03-20 ENCOUNTER — Other Ambulatory Visit (HOSPITAL_BASED_OUTPATIENT_CLINIC_OR_DEPARTMENT_OTHER): Payer: Commercial Managed Care - HMO | Admitting: *Deleted

## 2014-03-20 VITALS — BP 107/64 | HR 83 | Temp 97.8°F | Resp 18 | Ht 67.0 in | Wt 127.3 lb

## 2014-03-20 DIAGNOSIS — R519 Headache, unspecified: Secondary | ICD-10-CM

## 2014-03-20 DIAGNOSIS — D649 Anemia, unspecified: Secondary | ICD-10-CM

## 2014-03-20 DIAGNOSIS — C649 Malignant neoplasm of unspecified kidney, except renal pelvis: Secondary | ICD-10-CM

## 2014-03-20 DIAGNOSIS — C7949 Secondary malignant neoplasm of other parts of nervous system: Secondary | ICD-10-CM

## 2014-03-20 DIAGNOSIS — C7931 Secondary malignant neoplasm of brain: Secondary | ICD-10-CM

## 2014-03-20 DIAGNOSIS — E119 Type 2 diabetes mellitus without complications: Secondary | ICD-10-CM

## 2014-03-20 DIAGNOSIS — R51 Headache: Secondary | ICD-10-CM

## 2014-03-20 DIAGNOSIS — N182 Chronic kidney disease, stage 2 (mild): Secondary | ICD-10-CM | POA: Insufficient documentation

## 2014-03-20 DIAGNOSIS — F17200 Nicotine dependence, unspecified, uncomplicated: Secondary | ICD-10-CM

## 2014-03-20 DIAGNOSIS — E43 Unspecified severe protein-calorie malnutrition: Secondary | ICD-10-CM

## 2014-03-20 DIAGNOSIS — R109 Unspecified abdominal pain: Secondary | ICD-10-CM

## 2014-03-20 DIAGNOSIS — R918 Other nonspecific abnormal finding of lung field: Secondary | ICD-10-CM

## 2014-03-20 DIAGNOSIS — Z720 Tobacco use: Secondary | ICD-10-CM | POA: Insufficient documentation

## 2014-03-20 LAB — COMPREHENSIVE METABOLIC PANEL (CC13)
ALT: 16 U/L (ref 0–55)
AST: 13 U/L (ref 5–34)
Albumin: 2.9 g/dL — ABNORMAL LOW (ref 3.5–5.0)
Alkaline Phosphatase: 112 U/L (ref 40–150)
Anion Gap: 12 mEq/L — ABNORMAL HIGH (ref 3–11)
BILIRUBIN TOTAL: 0.23 mg/dL (ref 0.20–1.20)
BUN: 33.5 mg/dL — AB (ref 7.0–26.0)
CO2: 24 meq/L (ref 22–29)
Calcium: 9.6 mg/dL (ref 8.4–10.4)
Chloride: 102 mEq/L (ref 98–109)
Creatinine: 1.2 mg/dL — ABNORMAL HIGH (ref 0.6–1.1)
Glucose: 151 mg/dl — ABNORMAL HIGH (ref 70–140)
Potassium: 4.4 mEq/L (ref 3.5–5.1)
Sodium: 138 mEq/L (ref 136–145)
Total Protein: 6.8 g/dL (ref 6.4–8.3)

## 2014-03-20 LAB — CBC WITH DIFFERENTIAL/PLATELET
BASO%: 0.2 % (ref 0.0–2.0)
Basophils Absolute: 0 10*3/uL (ref 0.0–0.1)
EOS%: 1.3 % (ref 0.0–7.0)
Eosinophils Absolute: 0.2 10*3/uL (ref 0.0–0.5)
HCT: 34 % — ABNORMAL LOW (ref 34.8–46.6)
HEMOGLOBIN: 11.2 g/dL — AB (ref 11.6–15.9)
LYMPH%: 44 % (ref 14.0–49.7)
MCH: 28.5 pg (ref 25.1–34.0)
MCHC: 32.9 g/dL (ref 31.5–36.0)
MCV: 86.5 fL (ref 79.5–101.0)
MONO#: 0.7 10*3/uL (ref 0.1–0.9)
MONO%: 6 % (ref 0.0–14.0)
NEUT#: 5.9 10*3/uL (ref 1.5–6.5)
NEUT%: 48.5 % (ref 38.4–76.8)
Platelets: 273 10*3/uL (ref 145–400)
RBC: 3.93 10*6/uL (ref 3.70–5.45)
RDW: 24.8 % — AB (ref 11.2–14.5)
WBC: 12.1 10*3/uL — ABNORMAL HIGH (ref 3.9–10.3)
lymph#: 5.3 10*3/uL — ABNORMAL HIGH (ref 0.9–3.3)
nRBC: 0 % (ref 0–0)

## 2014-03-20 LAB — TECHNOLOGIST REVIEW

## 2014-03-20 LAB — T4, FREE: FREE T4: 1.19 ng/dL (ref 0.80–1.80)

## 2014-03-20 LAB — TSH CHCC: TSH: 4.181 m(IU)/L — ABNORMAL HIGH (ref 0.308–3.960)

## 2014-03-20 MED ORDER — SODIUM CHLORIDE 0.9 % IJ SOLN
10.0000 mL | INTRAMUSCULAR | Status: AC | PRN
  Administered 2014-03-20: 10 mL via INTRAVENOUS
  Filled 2014-03-20: qty 10

## 2014-03-20 MED ORDER — HEPARIN SOD (PORK) LOCK FLUSH 100 UNIT/ML IV SOLN
500.0000 [IU] | Freq: Once | INTRAVENOUS | Status: AC
  Administered 2014-03-20: 500 [IU] via INTRAVENOUS
  Filled 2014-03-20: qty 5

## 2014-03-20 MED ORDER — DEXAMETHASONE 1 MG PO TABS: 1.0000 mg | ORAL_TABLET | Freq: Every day | ORAL | Status: AC

## 2014-03-20 NOTE — Progress Notes (Signed)
[place] [facility] OFFICE PROGRESS NOTE  Patient Care Team: [name], MD as PCP - General (Internal Medicine) Heath Lark, MD as Consulting Physician (Hematology and Oncology)  SUMMARY OF ONCOLOGIC HISTORY: Oncology History   Renal cell carcinoma   Primary site: Kidney (Left)   Staging method: AJCC 7th Edition   Clinical: Stage IV (T3a, N0, M1) signed by Heath Lark, MD on 10/21/2013  8:46 AM   Pathologic: Stage IV (T3a, N0, M1) signed by Heath Lark, MD on 10/21/2013  8:47 AM   Summary: Stage IV (T3a, N0, M1)       Renal cell carcinoma   10/30/2010 Imaging Negative CT abdomen   09/15/2013 Surgery Patient underwent left nephrectomy which confirmed RCC, Fuhrman grade III/IV   09/27/2013 Imaging CT abdomen showed liver metastasis   09/29/2013 Procedure CT guided liver biopsy confrimed metastatic disease   09/29/2013 Imaging CT chest showed enlarging pulmonary metastases   09/30/2013 - 10/10/2013 Hospital Admission Admitted for orthostatic hypotension and headaches and severe malnutrition   10/21/2013 - 10/23/2013 Hospital Admission She was admitted to the hospital with recurrent urinary tract infection, dehydration, severe headaches and adrenal insufficiency.   11/07/2013 - 12/26/2013 Chemotherapy The patient begun palliative chemotherapy with weekly Temsirolimus   01/01/2014 Imaging Repeat CT scan show mixed response to treatment.   01/23/2014 -  Chemotherapy The patient was started on sunitinib.   01/29/2014 Imaging MRI of the head show a new brain lesion   02/11/2014 -  Radiation Therapy She received one dose of stereotactic radiation therapy to the brain metastasis    INTERVAL HISTORY: Please see below for problem oriented charting. Overall, she is doing well. She just started back on cycle 2 of Sutent on 03/16/2014. She has mild shortness of breath on exertion.  REVIEW OF SYSTEMS:   Constitutional: Denies fevers, chills or abnormal weight loss Eyes: Denies blurriness of  vision Ears, nose, mouth, throat, and face: Denies mucositis or sore throat Cardiovascular: Denies palpitation, chest discomfort or lower extremity swelling Gastrointestinal:  Denies nausea, heartburn or change in bowel habits Skin: Denies abnormal skin rashes Lymphatics: Denies new lymphadenopathy or easy bruising Neurological:Denies numbness, tingling or new weaknesses Behavioral/Psych: Mood is stable, no new changes  All other systems were reviewed with the patient and are negative.  I have reviewed the past medical history, past surgical history, social history and family history with the patient and they are unchanged from previous note.  ALLERGIES:  is allergic to penicillins and codeine.  MEDICATIONS:  Current Outpatient Prescriptions  Medication Sig Dispense Refill  . dexamethasone (DECADRON) 1 MG tablet Take 1 tablet (1 mg total) by mouth daily.  60 tablet  3  . lidocaine-prilocaine (EMLA) cream Apply 1 application topically as needed (port).      . metFORMIN (GLUCOPHAGE) 500 MG tablet TAKE 1 TABLET (500 MG TOTAL) BY MOUTH DAILY WITH BREAKFAST.  30 tablet  1  . mirtazapine (REMERON) 15 MG tablet Take 1 tablet (15 mg total) by mouth at bedtime.  30 tablet  3  . Oxycodone HCl 10 MG TABS Take 1 tablet (10 mg total) by mouth every 3 (three) hours as needed (pain).  60 tablet  0  . PARoxetine (PAXIL) 20 MG tablet Take 20 mg by mouth every evening.      . polyethylene glycol (MIRALAX / GLYCOLAX) packet Take 17 g by mouth daily.      . promethazine (PHENERGAN) 25 MG tablet Take 25 mg by mouth every 6 (six) hours as  needed for nausea.      . SUNItinib (SUTENT) 50 MG capsule Take 1 capsule (50 mg total) by mouth daily.  42 capsule  0   No current facility-administered medications for this visit.   Facility-Administered Medications Ordered in Other Visits  Medication Dose Route Frequency Provider Last Rate Last Dose  . sodium chloride 0.9 % injection 10 mL  10 mL Intravenous PRN Heath Lark, MD   10 mL at 03/20/14 1424    PHYSICAL EXAMINATION: ECOG PERFORMANCE STATUS: 1 - Symptomatic but completely ambulatory  Filed Vitals:   03/20/14 1205  BP: 107/64  Pulse: 83  Temp: 97.8 F (36.6 C)  Resp: 18   Filed Weights   03/20/14 1205  Weight: 127 lb 4.8 oz (57.743 kg)    GENERAL:alert, no distress and comfortable. She looks thin and mildly cachectic SKIN: skin color, texture, turgor are normal, no rashes or significant lesions EYES: normal, Conjunctiva are pink and non-injected, sclera clear OROPHARYNX:no exudate, no erythema and lips, buccal mucosa, and tongue normal  NECK: supple, thyroid normal size, non-tender, without nodularity LYMPH:  no palpable lymphadenopathy in the cervical, axillary or inguinal LUNGS: clear to auscultation and percussion with normal breathing effort HEART: regular rate & rhythm and no murmurs and no lower extremity edema ABDOMEN:abdomen soft, non-tender and normal bowel sounds Musculoskeletal:no cyanosis of digits and no clubbing  NEURO: alert & oriented x 3 with fluent speech, no focal motor/sensory deficits  LABORATORY DATA:  I have reviewed the data as listed    Component Value Date/Time   NA 138 03/20/2014 1142   NA 135* 10/23/2013 0409   K 4.4 03/20/2014 1142   K 4.6 10/23/2013 0409   CL 99 10/23/2013 0409   CO2 24 03/20/2014 1142   CO2 26 10/23/2013 0409   GLUCOSE 151* 03/20/2014 1142   GLUCOSE 215* 10/23/2013 0409   BUN 33.5* 03/20/2014 1142   BUN 16 10/23/2013 0409   CREATININE 1.2* 03/20/2014 1142   CREATININE 0.74 10/23/2013 0409   CALCIUM 9.6 03/20/2014 1142   CALCIUM 9.0 10/23/2013 0409   PROT 6.8 03/20/2014 1142   PROT 7.0 10/15/2013 1316   ALBUMIN 2.9* 03/20/2014 1142   ALBUMIN 3.5 10/15/2013 1316   AST 13 03/20/2014 1142   AST 12 10/15/2013 1316   ALT 16 03/20/2014 1142   ALT 10 10/15/2013 1316   ALKPHOS 112 03/20/2014 1142   ALKPHOS 81 10/15/2013 1316   BILITOT 0.23 03/20/2014 1142   BILITOT 0.6 10/15/2013 1316   GFRNONAA  82* 10/23/2013 0409   GFRAA >90 10/23/2013 0409    No results found for this basename: SPEP, UPEP,  kappa and lambda light chains    Lab Results  Component Value Date   WBC 12.1* 03/20/2014   NEUTROABS 5.9 03/20/2014   HGB 11.2* 03/20/2014   HCT 34.0* 03/20/2014   MCV 86.5 03/20/2014   PLT 273 03/20/2014      Chemistry      Component Value Date/Time   NA 138 03/20/2014 1142   NA 135* 10/23/2013 0409   K 4.4 03/20/2014 1142   K 4.6 10/23/2013 0409   CL 99 10/23/2013 0409   CO2 24 03/20/2014 1142   CO2 26 10/23/2013 0409   BUN 33.5* 03/20/2014 1142   BUN 16 10/23/2013 0409   CREATININE 1.2* 03/20/2014 1142   CREATININE 0.74 10/23/2013 0409      Component Value Date/Time   CALCIUM 9.6 03/20/2014 1142   CALCIUM 9.0 10/23/2013 0409   ALKPHOS  112 03/20/2014 1142   ALKPHOS 81 10/15/2013 1316   AST 13 03/20/2014 1142   AST 12 10/15/2013 1316   ALT 16 03/20/2014 1142   ALT 10 10/15/2013 1316   BILITOT 0.23 03/20/2014 1142   BILITOT 0.6 10/15/2013 1316     ASSESSMENT & PLAN:  Renal cell carcinoma She is seen prior to cycle 2 of treatment. Overall she is doing well and tolerated treatment well. We will proceed with treatment without dosage adjustment. I will proceed to order repeat CT scan of the chest abdomen and pelvis before she returns to see me next month to assess response to treatment.  Metastasis to lung Overall she is not symptomatic. I will order a CT scan to assess response to treatment next month.  Solitary 7 mm right frontal brain metastasis She has received radiation therapy and does not complain of dizziness or new focal neurological deficit. She will continue follow her radiation oncologist I plan to repeat MR of the head to exclude progression of disease. I will continue to initiate dexamethasone taper.  Severe protein-calorie malnutrition Her weight is stable. I will continue dexamethasone taper to 1 mg daily until I see her back next month. She felt that the dexamethasone is  helping with appetite.  Normocytic anemia This is likely anemia of chronic disease. The patient denies recent history of bleeding such as epistaxis, hematuria or hematochezia. She is asymptomatic from the anemia. We will observe for now.  She does not require transfusion now.    DIABETES MELLITUS, TYPE II Her blood sugar is better with the dexamethasone taper. She will continue medications under the care of her primary care provider. There is no complications from diabetes right now.  Headache This is improved. She takes Excedrin rarely as needed.  Abdominal pain Continue observation for now. She is not a surgical candidate right now.  Tobacco abuse The patient is not interested to quit right now. Nicotine cessation counseling is provided   Orders Placed This Encounter  Procedures  . CT Abdomen Pelvis Wo Contrast    Standing Status: Future     Number of Occurrences:      Standing Expiration Date: 06/20/2015    Order Specific Question:  Reason for Exam (SYMPTOM  OR DIAGNOSIS REQUIRED)    Answer:  metastatic kidney ca, assess response to Rx    Order Specific Question:  Preferred imaging location?    Answer:  [facility]  . CT Chest Wo Contrast    Standing Status: Future     Number of Occurrences:      Standing Expiration Date: 05/20/2015    Order Specific Question:  Reason for Exam (SYMPTOM  OR DIAGNOSIS REQUIRED)    Answer:  metastatic kidney ca, assess response to Rx    Order Specific Question:  Preferred imaging location?    Answer:  [facility]  . CBC with Differential    Standing Status: Standing     Number of Occurrences: 22     Standing Expiration Date: 03/21/2015  . Comprehensive metabolic panel    Standing Status: Standing     Number of Occurrences: 22     Standing Expiration Date: 03/21/2015  . TSH    Standing Status: Future     Number of Occurrences:      Standing Expiration Date: 03/20/2015  . T4, free    Standing Status: Future     Number of  Occurrences:      Standing Expiration Date: 03/20/2015   All  questions were answered. The patient knows to call the clinic with any problems, questions or concerns. No barriers to learning was detected.    Heath Lark, MD 03/20/2014 4:34 PM

## 2014-03-20 NOTE — Assessment & Plan Note (Signed)
Her blood sugar is better with the dexamethasone taper. She will continue medications under the care of her primary care provider. There is no complications from diabetes right now.

## 2014-03-20 NOTE — Assessment & Plan Note (Signed)
This is likely anemia of chronic disease. The patient denies recent history of bleeding such as epistaxis, hematuria or hematochezia. She is asymptomatic from the anemia. We will observe for now.  She does not require transfusion now.   

## 2014-03-20 NOTE — Assessment & Plan Note (Signed)
The patient is not interested to quit right now. Nicotine cessation counseling is provided

## 2014-03-20 NOTE — Assessment & Plan Note (Signed)
Continue observation for now. She is not a surgical candidate right now.

## 2014-03-20 NOTE — Assessment & Plan Note (Signed)
Due to loss of 1 kidney, as well as poorly controlled diabetes, I will order a CT scan without contrast in the future. Overall, her kidney function is stable.

## 2014-03-20 NOTE — Assessment & Plan Note (Signed)
She has received radiation therapy and does not complain of dizziness or new focal neurological deficit. She will continue follow her radiation oncologist I plan to repeat MR of the head to exclude progression of disease. I will continue to initiate dexamethasone taper.

## 2014-03-20 NOTE — Assessment & Plan Note (Signed)
Overall she is not symptomatic. I will order a CT scan to assess response to treatment next month.

## 2014-03-20 NOTE — Telephone Encounter (Signed)
per pof to sch pt appt-gave pt contrast and adv [facility] would call with appt time &*date-gave copy of sch

## 2014-03-20 NOTE — Assessment & Plan Note (Signed)
Her weight is stable. I will continue dexamethasone taper to 1 mg daily until I see her back next month. She felt that the dexamethasone is helping with appetite.

## 2014-03-20 NOTE — Assessment & Plan Note (Signed)
This is improved. She takes Excedrin rarely as needed.

## 2014-03-20 NOTE — Assessment & Plan Note (Signed)
She is seen prior to cycle 2 of treatment. Overall she is doing well and tolerated treatment well. We will proceed with treatment without dosage adjustment. I will proceed to order repeat CT scan of the chest abdomen and pelvis before she returns to see me next month to assess response to treatment.

## 2014-03-24 NOTE — Addendum Note (Signed)
Encounter addended by: Hosie Spangle, MD on: 03/24/2014 12:19 PM<BR>     Documentation filed: Notes Section

## 2014-03-26 ENCOUNTER — Ambulatory Visit
Admission: RE | Admit: 2014-03-26 | Discharge: 2014-03-26 | Disposition: A | Discharge status: Home / Self Care | Payer: Medicare HMO | Source: Ambulatory Visit | Attending: Radiation Oncology | Admitting: Radiation Oncology

## 2014-03-31 ENCOUNTER — Encounter: Payer: Self-pay | Admitting: Radiation Therapy

## 2014-03-31 NOTE — Progress Notes (Signed)
Clem and her sister were here for the Brain Tumor Support Group 03/26/14

## 2014-04-07 ENCOUNTER — Telehealth: Payer: Self-pay | Admitting: *Deleted

## 2014-04-07 NOTE — Telephone Encounter (Signed)
Diedre from [employer] called to see if pt is continuing on Sutent.  Informed her according to MD office note pt is still on Sutent.  Deidre will contact pt about getting refill, says she has one refill on file.

## 2014-04-10 NOTE — Telephone Encounter (Signed)
RECEIVED A FAX FROM THE [facility]AM CONCERNING A CONFIRMATION OF PRESCRIPTION SHIPMENT FOR SUTENT ON 04/10/14.

## 2014-04-21 ENCOUNTER — Ambulatory Visit (HOSPITAL_COMMUNITY)
Admission: RE | Admit: 2014-04-21 | Discharge: 2014-04-21 | Disposition: A | Discharge status: Home / Self Care | Payer: Medicare HMO | Source: Ambulatory Visit | Attending: Hematology and Oncology | Admitting: Hematology and Oncology

## 2014-04-21 DIAGNOSIS — C649 Malignant neoplasm of unspecified kidney, except renal pelvis: Secondary | ICD-10-CM | POA: Insufficient documentation

## 2014-04-21 DIAGNOSIS — Z9221 Personal history of antineoplastic chemotherapy: Secondary | ICD-10-CM | POA: Insufficient documentation

## 2014-04-21 DIAGNOSIS — Z905 Acquired absence of kidney: Secondary | ICD-10-CM | POA: Insufficient documentation

## 2014-04-21 DIAGNOSIS — K5730 Diverticulosis of large intestine without perforation or abscess without bleeding: Secondary | ICD-10-CM | POA: Insufficient documentation

## 2014-04-21 DIAGNOSIS — K439 Ventral hernia without obstruction or gangrene: Secondary | ICD-10-CM | POA: Insufficient documentation

## 2014-04-21 DIAGNOSIS — C787 Secondary malignant neoplasm of liver and intrahepatic bile duct: Secondary | ICD-10-CM | POA: Insufficient documentation

## 2014-04-21 DIAGNOSIS — C7931 Secondary malignant neoplasm of brain: Secondary | ICD-10-CM

## 2014-04-21 DIAGNOSIS — C7800 Secondary malignant neoplasm of unspecified lung: Secondary | ICD-10-CM | POA: Insufficient documentation

## 2014-04-23 ENCOUNTER — Encounter: Payer: Self-pay | Admitting: Hematology and Oncology

## 2014-04-23 ENCOUNTER — Telehealth: Payer: Self-pay | Admitting: Hematology and Oncology

## 2014-04-23 ENCOUNTER — Other Ambulatory Visit (HOSPITAL_BASED_OUTPATIENT_CLINIC_OR_DEPARTMENT_OTHER): Payer: Commercial Managed Care - HMO

## 2014-04-23 ENCOUNTER — Encounter: Payer: Self-pay | Admitting: *Deleted

## 2014-04-23 ENCOUNTER — Ambulatory Visit (HOSPITAL_BASED_OUTPATIENT_CLINIC_OR_DEPARTMENT_OTHER): Payer: Commercial Managed Care - HMO | Admitting: Hematology and Oncology

## 2014-04-23 ENCOUNTER — Ambulatory Visit: Payer: Medicare HMO

## 2014-04-23 VITALS — BP 112/65 | HR 83 | Temp 97.8°F | Resp 18 | Ht 67.0 in | Wt 137.3 lb

## 2014-04-23 DIAGNOSIS — C7949 Secondary malignant neoplasm of other parts of nervous system: Secondary | ICD-10-CM

## 2014-04-23 DIAGNOSIS — N182 Chronic kidney disease, stage 2 (mild): Secondary | ICD-10-CM

## 2014-04-23 DIAGNOSIS — R600 Localized edema: Secondary | ICD-10-CM

## 2014-04-23 DIAGNOSIS — C649 Malignant neoplasm of unspecified kidney, except renal pelvis: Secondary | ICD-10-CM

## 2014-04-23 DIAGNOSIS — C7931 Secondary malignant neoplasm of brain: Secondary | ICD-10-CM

## 2014-04-23 DIAGNOSIS — F17200 Nicotine dependence, unspecified, uncomplicated: Secondary | ICD-10-CM

## 2014-04-23 DIAGNOSIS — E2749 Other adrenocortical insufficiency: Secondary | ICD-10-CM

## 2014-04-23 DIAGNOSIS — C642 Malignant neoplasm of left kidney, except renal pelvis: Secondary | ICD-10-CM

## 2014-04-23 DIAGNOSIS — Z720 Tobacco use: Secondary | ICD-10-CM

## 2014-04-23 DIAGNOSIS — R609 Edema, unspecified: Secondary | ICD-10-CM

## 2014-04-23 DIAGNOSIS — C787 Secondary malignant neoplasm of liver and intrahepatic bile duct: Secondary | ICD-10-CM

## 2014-04-23 HISTORY — DX: Localized edema: R60.0

## 2014-04-23 LAB — CBC WITH DIFFERENTIAL/PLATELET
BASO%: 0.1 % (ref 0.0–2.0)
BASOS ABS: 0 10*3/uL (ref 0.0–0.1)
EOS%: 0.4 % (ref 0.0–7.0)
Eosinophils Absolute: 0 10*3/uL (ref 0.0–0.5)
HCT: 29 % — ABNORMAL LOW (ref 34.8–46.6)
HEMOGLOBIN: 9.4 g/dL — AB (ref 11.6–15.9)
LYMPH%: 41.6 % (ref 14.0–49.7)
MCH: 30 pg (ref 25.1–34.0)
MCHC: 32.4 g/dL (ref 31.5–36.0)
MCV: 92.7 fL (ref 79.5–101.0)
MONO#: 0.6 10*3/uL (ref 0.1–0.9)
MONO%: 8.2 % (ref 0.0–14.0)
NEUT#: 3.6 10*3/uL (ref 1.5–6.5)
NEUT%: 49.7 % (ref 38.4–76.8)
Platelets: 131 10*3/uL — ABNORMAL LOW (ref 145–400)
RBC: 3.13 10*6/uL — ABNORMAL LOW (ref 3.70–5.45)
RDW: 23.3 % — AB (ref 11.2–14.5)
WBC: 7.2 10*3/uL (ref 3.9–10.3)
lymph#: 3 10*3/uL (ref 0.9–3.3)
nRBC: 0 % (ref 0–0)

## 2014-04-23 LAB — COMPREHENSIVE METABOLIC PANEL (CC13)
ALT: 14 U/L (ref 0–55)
ANION GAP: 12 meq/L — AB (ref 3–11)
AST: 11 U/L (ref 5–34)
Albumin: 2.7 g/dL — ABNORMAL LOW (ref 3.5–5.0)
Alkaline Phosphatase: 87 U/L (ref 40–150)
BILIRUBIN TOTAL: 0.31 mg/dL (ref 0.20–1.20)
BUN: 21.9 mg/dL (ref 7.0–26.0)
CALCIUM: 9.2 mg/dL (ref 8.4–10.4)
CO2: 25 mEq/L (ref 22–29)
Chloride: 102 mEq/L (ref 98–109)
Creatinine: 0.8 mg/dL (ref 0.6–1.1)
Glucose: 105 mg/dl (ref 70–140)
Potassium: 4 mEq/L (ref 3.5–5.1)
Sodium: 139 mEq/L (ref 136–145)
Total Protein: 6.1 g/dL — ABNORMAL LOW (ref 6.4–8.3)

## 2014-04-23 LAB — T4, FREE: Free T4: 0.98 ng/dL (ref 0.80–1.80)

## 2014-04-23 LAB — TSH CHCC: TSH: 2.696 m(IU)/L (ref 0.308–3.960)

## 2014-04-23 MED ORDER — SODIUM CHLORIDE 0.9 % IJ SOLN
10.0000 mL | INTRAMUSCULAR | Status: DC | PRN
  Administered 2014-04-23: 10 mL via INTRAVENOUS
  Filled 2014-04-23: qty 10

## 2014-04-23 MED ORDER — HEPARIN SOD (PORK) LOCK FLUSH 100 UNIT/ML IV SOLN
500.0000 [IU] | Freq: Once | INTRAVENOUS | Status: AC
  Administered 2014-04-23: 500 [IU] via INTRAVENOUS
  Filled 2014-04-23: qty 5

## 2014-04-23 MED ORDER — PAZOPANIB HCL 200 MG PO TABS: 400.0000 mg | ORAL_TABLET | Freq: Every day | ORAL | Status: DC

## 2014-04-23 MED ORDER — FUROSEMIDE 20 MG PO TABS: 20.0000 mg | ORAL_TABLET | Freq: Two times a day (BID) | ORAL | Status: AC

## 2014-04-23 NOTE — Progress Notes (Signed)
[name] OFFICE PROGRESS NOTE  Patient Care Team: [name], MD as PCP - General (Internal Medicine) Heath Lark, MD as Consulting Physician (Hematology and Oncology)  SUMMARY OF ONCOLOGIC HISTORY: Oncology History   Renal cell carcinoma   Primary site: Kidney (Left)   Staging method: AJCC 7th Edition   Clinical: Stage IV (T3a, N0, M1) signed by Heath Lark, MD on 10/21/2013  8:46 AM   Pathologic: Stage IV (T3a, N0, M1) signed by Heath Lark, MD on 10/21/2013  8:47 AM   Summary: Stage IV (T3a, N0, M1)       Renal cell carcinoma   10/30/2010 Imaging Negative CT abdomen   09/15/2013 Surgery Patient underwent left nephrectomy which confirmed RCC, Fuhrman grade III/IV   09/27/2013 Imaging CT abdomen showed liver metastasis   09/29/2013 Procedure CT guided liver biopsy confrimed metastatic disease   09/29/2013 Imaging CT chest showed enlarging pulmonary metastases   09/30/2013 - 10/10/2013 Hospital Admission Admitted for orthostatic hypotension and headaches and severe malnutrition   10/21/2013 - 10/23/2013 Hospital Admission She was admitted to the hospital with recurrent urinary tract infection, dehydration, severe headaches and adrenal insufficiency.   11/07/2013 - 12/26/2013 Chemotherapy The patient begun palliative chemotherapy with weekly Temsirolimus   01/01/2014 Imaging Repeat CT scan show mixed response to treatment.   01/23/2014 - 04/20/2014 Chemotherapy The patient was started on sunitinib. Treatment is discontinued due to progression.   01/29/2014 Imaging MRI of the head show a new brain lesion   02/11/2014 -  Radiation Therapy She received one dose of stereotactic radiation therapy to the brain metastasis   04/21/2014 Imaging Unfortunately, CT scan showed disease progression.    INTERVAL HISTORY: Please see below for problem oriented charting. She has gain more than 10 pounds of fluid weight with diffuse edema. Her appetite is good. She complained of daily headaches. She  denies any pain in her abdomen. Show mild nonproductive cough. She continues to smoke.  REVIEW OF SYSTEMS:   Constitutional: Denies fevers, chills or abnormal weight loss Eyes: Denies blurriness of vision Ears, nose, mouth, throat, and face: Denies mucositis or sore throat Cardiovascular: Denies palpitation, chest discomfort or lower extremity swelling Skin: Denies abnormal skin rashes Lymphatics: Denies new lymphadenopathy or easy bruising Neurological:Denies numbness, tingling or new weaknesses Behavioral/Psych: Mood is stable, no new changes  All other systems were reviewed with the patient and are negative.  I have reviewed the past medical history, past surgical history, social history and family history with the patient and they are unchanged from previous note.  ALLERGIES:  is allergic to penicillins and codeine.  MEDICATIONS:  Current Outpatient Prescriptions  Medication Sig Dispense Refill  . dexamethasone (DECADRON) 1 MG tablet Take 1 tablet (1 mg total) by mouth daily.  60 tablet  3  . lidocaine-prilocaine (EMLA) cream Apply 1 application topically as needed (port).      . metFORMIN (GLUCOPHAGE) 500 MG tablet TAKE 1 TABLET (500 MG TOTAL) BY MOUTH DAILY WITH BREAKFAST.  30 tablet  1  . mirtazapine (REMERON) 15 MG tablet Take 1 tablet (15 mg total) by mouth at bedtime.  30 tablet  3  . Oxycodone HCl 10 MG TABS Take 1 tablet (10 mg total) by mouth every 3 (three) hours as needed (pain).  60 tablet  0  . PARoxetine (PAXIL) 20 MG tablet Take 20 mg by mouth every evening.      . polyethylene glycol (MIRALAX / GLYCOLAX) packet Take 17 g by mouth daily.      Marland Kitchen  promethazine (PHENERGAN) 25 MG tablet Take 25 mg by mouth every 6 (six) hours as needed for nausea.      . SUNItinib (SUTENT) 50 MG capsule Take 1 capsule (50 mg total) by mouth daily.  42 capsule  0  . furosemide (LASIX) 20 MG tablet Take 1 tablet (20 mg total) by mouth 2 (two) times daily.  60 tablet  1   Current  Facility-Administered Medications  Medication Dose Route Frequency Provider Last Rate Last Dose  . sodium chloride 0.9 % injection 10 mL  10 mL Intravenous PRN Heath Lark, MD   10 mL at 04/23/14 0920   Facility-Administered Medications Ordered in Other Visits  Medication Dose Route Frequency Provider Last Rate Last Dose  . sodium chloride 0.9 % injection 10 mL  10 mL Intravenous PRN Heath Lark, MD   10 mL at 03/20/14 1424    PHYSICAL EXAMINATION: ECOG PERFORMANCE STATUS: 1 - Symptomatic but completely ambulatory  Filed Vitals:   04/23/14 0843  BP: 112/65  Pulse: 83  Temp: 97.8 F (36.6 C)  Resp: 18   Filed Weights   04/23/14 0843  Weight: 137 lb 4.8 oz (62.279 kg)    GENERAL:alert, no distress and comfortable. She has diffuse swelling SKIN: skin color, texture, turgor are normal, no rashes or significant lesions EYES: normal, Conjunctiva are pink and non-injected, sclera clear OROPHARYNX:no exudate, no erythema and lips, buccal mucosa, and tongue normal  NECK: supple, thyroid normal size, non-tender, without nodularity LYMPH:  no palpable lymphadenopathy in the cervical, axillary or inguinal LUNGS: clear to auscultation and percussion with normal breathing effort HEART: regular rate & rhythm and no murmurs with moderate bilateral lower extremity edema ABDOMEN:abdomen soft, non-tender and normal bowel sounds Musculoskeletal:no cyanosis of digits and no clubbing  NEURO: alert & oriented x 3 with fluent speech, no focal motor/sensory deficits  LABORATORY DATA:  I have reviewed the data as listed    Component Value Date/Time   NA 139 04/23/2014 0821   NA 135* 10/23/2013 0409   K 4.0 04/23/2014 0821   K 4.6 10/23/2013 0409   CL 99 10/23/2013 0409   CO2 25 04/23/2014 0821   CO2 26 10/23/2013 0409   GLUCOSE 105 04/23/2014 0821   GLUCOSE 215* 10/23/2013 0409   BUN 21.9 04/23/2014 0821   BUN 16 10/23/2013 0409   CREATININE 0.8 04/23/2014 0821   CREATININE 0.74 10/23/2013 0409   CALCIUM 9.2  04/23/2014 0821   CALCIUM 9.0 10/23/2013 0409   PROT 6.1* 04/23/2014 0821   PROT 7.0 10/15/2013 1316   ALBUMIN 2.7* 04/23/2014 0821   ALBUMIN 3.5 10/15/2013 1316   AST 11 04/23/2014 0821   AST 12 10/15/2013 1316   ALT 14 04/23/2014 0821   ALT 10 10/15/2013 1316   ALKPHOS 87 04/23/2014 0821   ALKPHOS 81 10/15/2013 1316   BILITOT 0.31 04/23/2014 0821   BILITOT 0.6 10/15/2013 1316   GFRNONAA 82* 10/23/2013 0409   GFRAA >90 10/23/2013 0409    No results found for this basename: SPEP, UPEP,  kappa and lambda light chains    Lab Results  Component Value Date   WBC 7.2 04/23/2014   NEUTROABS 3.6 04/23/2014   HGB 9.4* 04/23/2014   HCT 29.0* 04/23/2014   MCV 92.7 04/23/2014   PLT 131* 04/23/2014      Chemistry      Component Value Date/Time   NA 139 04/23/2014 0821   NA 135* 10/23/2013 0409   K 4.0 04/23/2014 0821   K  4.6 10/23/2013 0409   CL 99 10/23/2013 0409   CO2 25 04/23/2014 0821   CO2 26 10/23/2013 0409   BUN 21.9 04/23/2014 0821   BUN 16 10/23/2013 0409   CREATININE 0.8 04/23/2014 0821   CREATININE 0.74 10/23/2013 0409      Component Value Date/Time   CALCIUM 9.2 04/23/2014 0821   CALCIUM 9.0 10/23/2013 0409   ALKPHOS 87 04/23/2014 0821   ALKPHOS 81 10/15/2013 1316   AST 11 04/23/2014 0821   AST 12 10/15/2013 1316   ALT 14 04/23/2014 0821   ALT 10 10/15/2013 1316   BILITOT 0.31 04/23/2014 0821   BILITOT 0.6 10/15/2013 1316       RADIOGRAPHIC STUDIES: I reviewed the imaging study with the patient and family. I have personally reviewed the radiological images as listed and agreed with the findings in the report. Ct Abdomen Pelvis Wo Contrast  04/21/2014   CLINICAL DATA:  Followup metastatic renal cell carcinoma. Ongoing chemotherapy. Restaging.  EXAM: CT CHEST, ABDOMEN AND PELVIS WITHOUT CONTRAST  TECHNIQUE: Multidetector CT imaging of the chest, abdomen and pelvis was performed following the standard protocol without IV contrast.  COMPARISON:  01/01/2014  FINDINGS: CT CHEST FINDINGS  Diffuse bilateral pulmonary  metastases again seen, many showing interval increase in size since previous study. Measurements of representative index nodules are as follows:  Right middle lobe measuring 14 mm on image 21 compared to 11 mm previously  Right lower lobe measuring 2.7 cm on image 30 compared to 2.2 cm previously  Left lower lobe measuring 2.3 cm on image 43 compared to 2.1 cm previously  Anterior lingula measuring 14 mm on image 39 compared to 10 mm previously  Postop changes again seen from previous right upper lobectomy. Mild to moderate emphysema again noted. No evidence of pleural or pericardial effusion. No evidence of hilar or mediastinal lymphadenopathy. No adenopathy seen elsewhere within the thorax. No suspicious bone lesions identified.  CT ABDOMEN AND PELVIS FINDINGS  1.5 cm low-attenuation lesion in left hepatic lobe on image 47 and 1.1 cm low-attenuation lesion in the post right hepatic lobe on image 57 show no significant change compared to previous exam. No other liver lesions seen on this noncontrast study. Noncontrast images of the pancreas, spleen, and right kidney are normal in appearance. Prior left nephrectomy noted.  Increased size of soft tissue mass is seen in the left nephrectomy bed medially in the left paraspinal region. This mass currently measures 2.9 x 3.0 cm on image 59 compared to 1.3 x 1.5 cm previously, consistent with recurrent carcinoma. Now seen is another soft tissue mass in the lateral left perinephric space measuring 2.2 x 2.4 cm on image 63. No other soft tissue masses or lymphadenopathy identified within the abdomen or pelvis.  Diverticulosis is demonstrated, however there is no evidence of diverticulitis. No other inflammatory process or abnormal fluid collections are seen. No evidence of bowel obstruction. Small ventral hernia is again noted. No suspicious bone lesions are identified.  IMPRESSION: Mild progression of bilateral pulmonary metastases since prior study.  Increased size of  soft tissue masses in the medial and lateral aspects of the left nephrectomy bed, consistent with recurrent or metastatic carcinoma.  No definite change in small liver metastases.   Electronically Signed   By: Earle Gell M.D.   On: 04/21/2014 15:30   Ct Chest Wo Contrast  04/21/2014   CLINICAL DATA:  Followup metastatic renal cell carcinoma. Ongoing chemotherapy. Restaging.  EXAM: CT CHEST, ABDOMEN AND PELVIS  WITHOUT CONTRAST  TECHNIQUE: Multidetector CT imaging of the chest, abdomen and pelvis was performed following the standard protocol without IV contrast.  COMPARISON:  01/01/2014  FINDINGS: CT CHEST FINDINGS  Diffuse bilateral pulmonary metastases again seen, many showing interval increase in size since previous study. Measurements of representative index nodules are as follows:  Right middle lobe measuring 14 mm on image 21 compared to 11 mm previously  Right lower lobe measuring 2.7 cm on image 30 compared to 2.2 cm previously  Left lower lobe measuring 2.3 cm on image 43 compared to 2.1 cm previously  Anterior lingula measuring 14 mm on image 39 compared to 10 mm previously  Postop changes again seen from previous right upper lobectomy. Mild to moderate emphysema again noted. No evidence of pleural or pericardial effusion. No evidence of hilar or mediastinal lymphadenopathy. No adenopathy seen elsewhere within the thorax. No suspicious bone lesions identified.  CT ABDOMEN AND PELVIS FINDINGS  1.5 cm low-attenuation lesion in left hepatic lobe on image 47 and 1.1 cm low-attenuation lesion in the post right hepatic lobe on image 57 show no significant change compared to previous exam. No other liver lesions seen on this noncontrast study. Noncontrast images of the pancreas, spleen, and right kidney are normal in appearance. Prior left nephrectomy noted.  Increased size of soft tissue mass is seen in the left nephrectomy bed medially in the left paraspinal region. This mass currently measures 2.9 x 3.0 cm  on image 59 compared to 1.3 x 1.5 cm previously, consistent with recurrent carcinoma. Now seen is another soft tissue mass in the lateral left perinephric space measuring 2.2 x 2.4 cm on image 63. No other soft tissue masses or lymphadenopathy identified within the abdomen or pelvis.  Diverticulosis is demonstrated, however there is no evidence of diverticulitis. No other inflammatory process or abnormal fluid collections are seen. No evidence of bowel obstruction. Small ventral hernia is again noted. No suspicious bone lesions are identified.  IMPRESSION: Mild progression of bilateral pulmonary metastases since prior study.  Increased size of soft tissue masses in the medial and lateral aspects of the left nephrectomy bed, consistent with recurrent or metastatic carcinoma.  No definite change in small liver metastases.   Electronically Signed   By: Earle Gell M.D.   On: 04/21/2014 15:30     ASSESSMENT & PLAN:  Renal cell carcinoma Unfortunately, chemotherapy is not working. I told her further treatment may not work and her overall prognosis is very poor.  Any treatment is palliative only. I discussed with her the risks, benefits and side effects using Pazopanib and she agreed to proceed.  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 She is starting to have chronic headaches. I will order MRI to exclude progression of cancer in the brain.  Tobacco abuse I spent some time counseling the patient the importance of tobacco cessation. she is currently attempting to quit on her own  Leg edema I am worried about congestive heart failure that could be associated with Sutent. I will order echocardiogram. I will start her on a diuretic therapy with a goal of getting her weight down to 130 pounds.  CKD (chronic kidney disease), stage II She has disease recurrence of tumor bed. The serum creatinine is stable.   Orders  Placed This Encounter  Procedures  . MR Brain Wo Contrast    Standing Status: Future     Number of Occurrences:  Standing Expiration Date: 06/25/2015    Order Specific Question:  Reason for Exam (SYMPTOM  OR DIAGNOSIS REQUIRED)    Answer:  headache, progression of metastatic kidney ca, exclude progression    Order Specific Question:  Preferred imaging location?    Answer:  [facility]    Order Specific Question:  Does the patient have a pacemaker or implanted devices?    Answer:  No    Order Specific Question:  What is the patient's sedation requirement?    Answer:  No Sedation  . 2D Echocardiogram without contrast    Standing Status: Future     Number of Occurrences:      Standing Expiration Date: 04/23/2015    Order Specific Question:  Type of Echo    Answer:  Limited    Order Specific Question:  Reason for Exam    Answer:  Evaluate EF    Order Specific Question:  Where should this test be performed    Answer:  [facility]   All questions were answered. The patient knows to call the clinic with any problems, questions or concerns. No barriers to learning was detected. I spent 40 minutes counseling the patient face to face. The total time spent in the appointment was 55 minutes and more than 50% was on counseling and review of test results     [facility], [place], MD 04/23/2014 10:26 AM

## 2014-04-23 NOTE — Assessment & Plan Note (Signed)
Unfortunately, chemotherapy is not working. I told her further treatment may not work and her overall prognosis is very poor.  Any treatment is palliative only. I discussed with her the risks, benefits and side effects using Pazopanib and she agreed to proceed.

## 2014-04-23 NOTE — Assessment & Plan Note (Signed)
She has disease recurrence of tumor bed. The serum creatinine is stable.

## 2014-04-23 NOTE — Assessment & Plan Note (Signed)
She had received palliative radiation therapy to the solitary brain metastases. She is starting to have chronic headaches. I will order MRI to exclude progression of cancer in the brain.

## 2014-04-23 NOTE — Assessment & Plan Note (Signed)
I am worried about congestive heart failure that could be associated with Sutent. I will order echocardiogram. I will start her on a diuretic therapy with a goal of getting her weight down to 130 pounds.

## 2014-04-23 NOTE — Assessment & Plan Note (Signed)
I spent some time counseling the patient the importance of tobacco cessation. she is currently attempting to quit on her own 

## 2014-04-23 NOTE — Progress Notes (Signed)
New Rx for Votrient given to Carmelina Noun in managed care dept for prior auth.

## 2014-04-23 NOTE — Progress Notes (Signed)
Faxed votrient prescription to [facility].

## 2014-04-30 ENCOUNTER — Encounter: Payer: Self-pay | Admitting: Hematology and Oncology

## 2014-04-30 ENCOUNTER — Other Ambulatory Visit: Payer: Self-pay | Admitting: Radiation Therapy

## 2014-04-30 DIAGNOSIS — C7949 Secondary malignant neoplasm of other parts of nervous system: Principal | ICD-10-CM

## 2014-04-30 DIAGNOSIS — C7931 Secondary malignant neoplasm of brain: Secondary | ICD-10-CM

## 2014-04-30 NOTE — Progress Notes (Signed)
[place], [id number], approved votrient from 04/30/14-05/01/15

## 2014-05-01 ENCOUNTER — Ambulatory Visit (HOSPITAL_COMMUNITY): Admission: RE | Admit: 2014-05-01 | Payer: Medicare HMO | Source: Ambulatory Visit

## 2014-05-01 ENCOUNTER — Ambulatory Visit
Admission: RE | Admit: 2014-05-01 | Discharge: 2014-05-01 | Disposition: A | Discharge status: Home / Self Care | Payer: Commercial Managed Care - HMO | Source: Ambulatory Visit | Attending: Hematology and Oncology | Admitting: Hematology and Oncology

## 2014-05-01 ENCOUNTER — Other Ambulatory Visit: Payer: Commercial Managed Care - HMO

## 2014-05-01 DIAGNOSIS — C7949 Secondary malignant neoplasm of other parts of nervous system: Secondary | ICD-10-CM

## 2014-05-01 DIAGNOSIS — C7931 Secondary malignant neoplasm of brain: Secondary | ICD-10-CM

## 2014-05-01 DIAGNOSIS — C642 Malignant neoplasm of left kidney, except renal pelvis: Secondary | ICD-10-CM

## 2014-05-01 MED ORDER — GADOBENATE DIMEGLUMINE 529 MG/ML IV SOLN
12.0000 mL | Freq: Once | INTRAVENOUS | Status: AC | PRN
  Administered 2014-05-01: 12 mL via INTRAVENOUS

## 2014-05-04 ENCOUNTER — Telehealth: Payer: Self-pay | Admitting: *Deleted

## 2014-05-04 NOTE — Telephone Encounter (Signed)
Pt states has not heard from [facility] but got a letter from [employer] w/ paperwork for pt to fill out.  Asked pt to call [facility] to find out if they are filling her Rx and informed her the Votrient was approved by [facility] according to note by Carmelina Noun in our managed care dept.. She verbalized understanding and will call [facility] and call nurse back.

## 2014-05-04 NOTE — Telephone Encounter (Signed)
Left VM w/ pt asking pt to call nurse back to inform if she has received Votrient yet?

## 2014-05-05 ENCOUNTER — Encounter: Payer: Self-pay | Admitting: Hematology and Oncology

## 2014-05-05 ENCOUNTER — Telehealth: Payer: Self-pay | Admitting: *Deleted

## 2014-05-05 ENCOUNTER — Encounter: Payer: Self-pay | Admitting: Radiation Oncology

## 2014-05-05 NOTE — Telephone Encounter (Signed)
Pt states s/w [facility] yesterday and they inform her co-pay for Votrient is over $1,000.  They had her call a company called [employer] for assistance.  Pt called [employer] but they say they do not have any funds right now.  Pt received paperwork from a company called [employer] in mail, but not sure what that is for and [facility] says they did not contact this company on [employer].   S/w Carmelina Noun in our managed Care dept to investigate if any other assistance available for pt..  She says she will contact pt.Marland Kitchen

## 2014-05-05 NOTE — Progress Notes (Signed)
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  Reports daily frontal headaches. She reports that often she wakes up with a headache. She describes the headache as dull tension requiring her to take two excedrin mirgraine and a nap. She reports her heads have been more intense and frequent over the last two weeks. Requesting oxycodone refill for headaches. Stopped taking Sutent prescribed Votrient but, hasn't received it yet. Continues to take decadron 1 mg daily. Denies nausea, vomiting, dizziness, diplopia or ringing in the ears. Continues to have an unsteady gait and walks with a cane                             ALLERGIES:  is allergic to penicillins and codeine.  Meds: Current Outpatient Prescriptions  Medication Sig Dispense Refill  . dexamethasone (DECADRON) 1 MG tablet Take 1 tablet (1 mg total) by mouth daily.  60 tablet  3  . furosemide (LASIX) 20 MG tablet Take 1 tablet (20 mg total) by mouth 2 (two) times daily.  60 tablet  1  . lidocaine-prilocaine (EMLA) cream Apply 1 application topically as needed (port).      . metFORMIN (GLUCOPHAGE) 500 MG tablet TAKE 1 TABLET (500 MG TOTAL) BY MOUTH DAILY WITH BREAKFAST.  30 tablet  1  . mirtazapine (REMERON) 15 MG tablet Take 1 tablet (15 mg total) by mouth at bedtime.  30 tablet  3  . Oxycodone HCl 10 MG TABS Take 1 tablet (10 mg total) by mouth every 3 (three) hours as needed (pain).  60 tablet  0  .  PARoxetine (PAXIL) 20 MG tablet Take 20 mg by mouth every evening.      . polyethylene glycol (MIRALAX / GLYCOLAX) packet Take 17 g by mouth daily.      . pazopanib (VOTRIENT) 200 MG tablet Take 2 tablets (400 mg total) by mouth daily. Take on an empty stomach.  4 tablet  0  . promethazine (PHENERGAN) 25 MG tablet Take 25 mg by mouth every 6 (six) hours as needed for nausea.       No current facility-administered medications for this encounter.   Facility-Administered Medications Ordered in Other Encounters  Medication Dose Route Frequency Provider Last Rate Last Dose  . sodium chloride 0.9 % injection 10 mL  10 mL Intravenous PRN Heath Lark, MD   10 mL at 03/20/14 1424    Physical Findings: The patient is in no acute distress. Patient is alert and oriented.  weight is 130 lb 9.6 oz (59.24 kg). Her blood pressure is 85/62 and her pulse is 86. Her respiration is 16 and oxygen saturation is 96%. .  No significant changes.  Lab Findings: Lab Results  Component Value Date   WBC 7.2 04/23/2014   HGB 9.4* 04/23/2014   HCT 29.0* 04/23/2014   MCV 92.7 04/23/2014   PLT 131* 04/23/2014    @LASTCHEM @  Radiographic Findings: Ct Abdomen Pelvis Wo Contrast  04/21/2014   CLINICAL DATA:  Followup metastatic renal cell carcinoma. Ongoing chemotherapy. Restaging.  EXAM: CT CHEST, ABDOMEN AND PELVIS WITHOUT CONTRAST  TECHNIQUE: Multidetector CT imaging of the chest, abdomen and pelvis was performed following the standard protocol without IV contrast.  COMPARISON:  01/01/2014  FINDINGS: CT CHEST FINDINGS  Diffuse bilateral pulmonary metastases again seen, many showing interval increase in size since previous study. Measurements of representative index nodules are as follows:  Right middle lobe measuring 14 mm on image 21 compared to 11 mm previously  Right lower lobe measuring 2.7 cm on image 30 compared to 2.2 cm previously  Left lower lobe measuring 2.3 cm on image 43 compared to 2.1 cm previously  Anterior  lingula measuring 14 mm on image 39 compared to 10 mm previously  Postop changes again seen from previous right upper lobectomy. Mild to moderate emphysema again noted. No evidence of pleural or pericardial effusion. No evidence of hilar or mediastinal lymphadenopathy. No adenopathy seen elsewhere within the thorax. No suspicious bone lesions identified.  CT ABDOMEN AND PELVIS FINDINGS  1.5 cm low-attenuation lesion in left hepatic lobe on image 47 and 1.1 cm low-attenuation lesion in the post right hepatic lobe on image 57 show no significant change compared to previous exam. No other liver lesions seen on this noncontrast study. Noncontrast images of the pancreas, spleen, and right kidney are normal in appearance. Prior left nephrectomy noted.  Increased size of soft tissue mass is seen in the left nephrectomy bed medially in the left paraspinal region. This mass currently measures 2.9 x 3.0 cm on image 59 compared to 1.3 x 1.5 cm previously, consistent with recurrent carcinoma. Now seen is another soft tissue mass in the lateral left perinephric space measuring 2.2 x 2.4 cm on image 63. No other soft tissue masses or lymphadenopathy identified within the abdomen or pelvis.  Diverticulosis is demonstrated, however there is no evidence of diverticulitis. No other inflammatory process or abnormal fluid collections are seen. No evidence of bowel obstruction. Small ventral hernia is again noted. No suspicious bone lesions are identified.  IMPRESSION: Mild progression of bilateral pulmonary metastases since prior study.  Increased size of soft tissue masses in the medial and lateral aspects of the left nephrectomy bed, consistent with recurrent or metastatic carcinoma.  No definite change in small liver metastases.   Electronically Signed   By: Earle Gell M.D.   On: 04/21/2014 15:30   Ct Chest Wo Contrast  04/21/2014   CLINICAL DATA:  Followup metastatic renal cell carcinoma. Ongoing chemotherapy. Restaging.  EXAM:  CT CHEST, ABDOMEN AND PELVIS WITHOUT CONTRAST  TECHNIQUE: Multidetector CT imaging of the chest, abdomen and pelvis was performed following the standard protocol without IV contrast.  COMPARISON:  01/01/2014  FINDINGS: CT CHEST FINDINGS  Diffuse bilateral pulmonary metastases again seen, many showing interval increase in size since previous study. Measurements of representative index nodules are as follows:  Right middle lobe measuring 14 mm on image 21 compared to 11 mm previously  Right lower lobe measuring 2.7 cm on image 30 compared to 2.2 cm previously  Left lower lobe measuring 2.3 cm on image 43 compared to 2.1 cm previously  Anterior lingula measuring 14 mm on image 39 compared to 10 mm previously  Postop changes again seen from previous right upper lobectomy. Mild to moderate emphysema again noted. No evidence of pleural or pericardial effusion. No evidence of hilar or mediastinal lymphadenopathy. No adenopathy  seen elsewhere within the thorax. No suspicious bone lesions identified.  CT ABDOMEN AND PELVIS FINDINGS  1.5 cm low-attenuation lesion in left hepatic lobe on image 47 and 1.1 cm low-attenuation lesion in the post right hepatic lobe on image 57 show no significant change compared to previous exam. No other liver lesions seen on this noncontrast study. Noncontrast images of the pancreas, spleen, and right kidney are normal in appearance. Prior left nephrectomy noted.  Increased size of soft tissue mass is seen in the left nephrectomy bed medially in the left paraspinal region. This mass currently measures 2.9 x 3.0 cm on image 59 compared to 1.3 x 1.5 cm previously, consistent with recurrent carcinoma. Now seen is another soft tissue mass in the lateral left perinephric space measuring 2.2 x 2.4 cm on image 63. No other soft tissue masses or lymphadenopathy identified within the abdomen or pelvis.  Diverticulosis is demonstrated, however there is no evidence of diverticulitis. No other inflammatory  process or abnormal fluid collections are seen. No evidence of bowel obstruction. Small ventral hernia is again noted. No suspicious bone lesions are identified.  IMPRESSION: Mild progression of bilateral pulmonary metastases since prior study.  Increased size of soft tissue masses in the medial and lateral aspects of the left nephrectomy bed, consistent with recurrent or metastatic carcinoma.  No definite change in small liver metastases.   Electronically Signed   By: Earle Gell M.D.   On: 04/21/2014 15:30   Mr Jeri Cos MP Contrast  05/01/2014   CLINICAL DATA:  History of lung cancer and renal cell carcinoma. Followup right frontal lesion. Restaging.  EXAM: MRI HEAD WITHOUT AND WITH CONTRAST  TECHNIQUE: Multiplanar, multiecho pulse sequences of the brain and surrounding structures were obtained without and with intravenous contrast.  CONTRAST:  71mL MULTIHANCE GADOBENATE DIMEGLUMINE 529 MG/ML IV SOLN  COMPARISON:  02/03/2014.  10/15/2013.  FINDINGS: Diffusion imaging does not show any acute or subacute infarction. The brain again shows mild generalized atrophy with mild chronic small-vessel disease of the cerebral hemispheric white matter. Chronic small-vessel changes also affect the pons. No hydrocephalus or extra-axial collection.  Previously seen 7 mm lesion at the right posterior frontal vertex has responded completely. There is no evidence of mass or enhancement in that region. No residual edema. There is a tiny bit of residual hemosiderin.  No convincing new lesion. One could question a 1 mm focus of enhancement in the right cerebellum. I do not think that this is definite. This should be followed.  The pituitary gland is normal. No inflammatory sinus disease. Small amount of fluid in the mastoid air cells left more than right. No skull or skullbase lesion.  IMPRESSION: Complete response of the right parietal vertex metastasis previously seen. No evidence of residual mass or edema. Tiny bit of residual  hemosiderin, not unexpected.  No definite new lesion. Question 1 mm focus of enhancement within the cerebellum on the right. This is not definite and can be followed. As it is seen only on the coronal sequence, I think it is more likely that this is artifactual.   Electronically Signed   By: Nelson Chimes M.D.   On: 05/01/2014 10:29    Impression:  The MRI shows complete response s/p SRS and no new definitive metastases.  Plan:  MRI in 3 months then follow-up.  _____________________________________  Sheral Apley. Tammi Klippel, M.D.

## 2014-05-05 NOTE — Progress Notes (Signed)
Patient's votrients copay is $1600, which she cannot afford.  [employer] can help her with free medication if she has spent $600 this year in prescription coverage.  I called the patient and she do not believe she spent that and I told her she can buy one or two votrients to get her to the $600 then the medicine will be free, if she is approved.

## 2014-05-06 ENCOUNTER — Other Ambulatory Visit: Payer: Self-pay | Admitting: *Deleted

## 2014-05-06 ENCOUNTER — Ambulatory Visit
Admission: RE | Admit: 2014-05-06 | Discharge: 2014-05-06 | Disposition: A | Discharge status: Home / Self Care | Payer: Medicare HMO | Source: Ambulatory Visit | Attending: Radiation Oncology | Admitting: Radiation Oncology

## 2014-05-06 ENCOUNTER — Encounter: Payer: Self-pay | Admitting: Radiation Oncology

## 2014-05-06 VITALS — BP 85/62 | HR 86 | Resp 16 | Wt 130.6 lb

## 2014-05-06 DIAGNOSIS — C7931 Secondary malignant neoplasm of brain: Secondary | ICD-10-CM

## 2014-05-06 DIAGNOSIS — C7949 Secondary malignant neoplasm of other parts of nervous system: Principal | ICD-10-CM

## 2014-05-06 DIAGNOSIS — C642 Malignant neoplasm of left kidney, except renal pelvis: Secondary | ICD-10-CM

## 2014-05-06 MED ORDER — PAZOPANIB HCL 200 MG PO TABS: 400.0000 mg | ORAL_TABLET | Freq: Every day | ORAL | Status: DC

## 2014-05-06 NOTE — Progress Notes (Signed)
Reports daily frontal headaches. She reports that often she wakes up with a headache. She describes the headache as dull tension requiring her to take two excedrin mirgraine and a nap. She reports her heads have been more intense and frequent over the last two weeks. Requesting oxycodone refill for headaches. Stopped taking Sutent prescribed Votrient but, hasn't received it yet. Continues to take decadron 1 mg daily. Denies nausea, vomiting, dizziness, diplopia or ringing in the ears. Continues to have an unsteady gait and walks with a cane.

## 2014-05-07 ENCOUNTER — Telehealth: Payer: Self-pay | Admitting: Radiation Oncology

## 2014-05-07 ENCOUNTER — Other Ambulatory Visit: Payer: Self-pay | Admitting: Hematology and Oncology

## 2014-05-07 ENCOUNTER — Other Ambulatory Visit: Payer: Self-pay | Admitting: *Deleted

## 2014-05-07 ENCOUNTER — Telehealth: Payer: Self-pay | Admitting: *Deleted

## 2014-05-07 DIAGNOSIS — C7931 Secondary malignant neoplasm of brain: Secondary | ICD-10-CM

## 2014-05-07 DIAGNOSIS — C649 Malignant neoplasm of unspecified kidney, except renal pelvis: Secondary | ICD-10-CM

## 2014-05-07 MED ORDER — OXYCODONE HCL 10 MG PO TABS: 10.0000 mg | ORAL_TABLET | ORAL | Status: DC | PRN

## 2014-05-07 MED ORDER — BUTALBITAL-APAP-CAFFEINE 50-325-40 MG PO TABS: 1.0000 | ORAL_TABLET | Freq: Four times a day (QID) | ORAL | Status: DC | PRN

## 2014-05-07 NOTE — Telephone Encounter (Signed)
Samantha RN in [employer] will notify pt of Rx ready to pick up.. Placed in locked cabinet per policy.

## 2014-05-07 NOTE — Telephone Encounter (Signed)
Pt picked up 4 Votrient pills from [facility] and asks if she should start taking them or wait until she gets the rest of her supply?   Instructed pt to wait as the 4 pills is only 2 days worth and it may be another week before she receives all her Votrient.  Let us know as soon as she gets the rest of the Votrient.  She verbalized understanding.

## 2014-05-07 NOTE — Telephone Encounter (Signed)
Patient phoned requesting refill of oxycodone last written by Dr. Alvy Bimler. Reached out to Cameo, RN to determine if Dr. Alvy Bimler wants to manage patient's pain medication or if she wishes for Dr. Tammi Klippel to refill this. Patient requesting medication for headache relief.

## 2014-05-07 NOTE — Telephone Encounter (Signed)
Phoned patient. Left message that her oxycodone script is ready for pick up in [facility]. Reminded her she needed bring her license in order to be allowed to pick up the script. Encouraged patient to call with future needs.

## 2014-05-08 ENCOUNTER — Encounter: Payer: Self-pay | Admitting: Hematology and Oncology

## 2014-05-08 NOTE — Progress Notes (Signed)
Patient has been approved for free votrient through [employer] from 05/08/14-05/03/15

## 2014-05-18 ENCOUNTER — Encounter: Payer: Self-pay | Admitting: Hematology and Oncology

## 2014-05-18 NOTE — Progress Notes (Signed)
I spoke w/ pt about needing possible assistance with her medication.  She said she doesn't need assistance with medication but with all of the bills she is receiving.  She stated she wishes she can just receive 1 bill.  I suggested she apply for the Access One card where all her bills can be put on the card and 1 payment can be made.  She was interested in that so I gave her the number to [employer] to apply for Access One.

## 2014-05-19 ENCOUNTER — Encounter: Payer: Self-pay | Admitting: *Deleted

## 2014-05-28 ENCOUNTER — Ambulatory Visit: Payer: Medicare HMO | Attending: Internal Medicine

## 2014-05-29 ENCOUNTER — Ambulatory Visit (HOSPITAL_BASED_OUTPATIENT_CLINIC_OR_DEPARTMENT_OTHER): Payer: Commercial Managed Care - HMO

## 2014-05-29 ENCOUNTER — Other Ambulatory Visit (HOSPITAL_BASED_OUTPATIENT_CLINIC_OR_DEPARTMENT_OTHER): Payer: Commercial Managed Care - HMO

## 2014-05-29 ENCOUNTER — Telehealth: Payer: Self-pay | Admitting: Hematology and Oncology

## 2014-05-29 ENCOUNTER — Encounter: Payer: Self-pay | Admitting: Hematology and Oncology

## 2014-05-29 ENCOUNTER — Ambulatory Visit (HOSPITAL_BASED_OUTPATIENT_CLINIC_OR_DEPARTMENT_OTHER): Payer: Commercial Managed Care - HMO | Admitting: Hematology and Oncology

## 2014-05-29 ENCOUNTER — Other Ambulatory Visit: Payer: Commercial Managed Care - HMO

## 2014-05-29 VITALS — BP 136/69 | HR 84 | Temp 98.2°F | Resp 18 | Ht 67.0 in | Wt 131.2 lb

## 2014-05-29 DIAGNOSIS — Z95828 Presence of other vascular implants and grafts: Secondary | ICD-10-CM

## 2014-05-29 DIAGNOSIS — Z720 Tobacco use: Secondary | ICD-10-CM

## 2014-05-29 DIAGNOSIS — C7931 Secondary malignant neoplasm of brain: Secondary | ICD-10-CM

## 2014-05-29 DIAGNOSIS — C7800 Secondary malignant neoplasm of unspecified lung: Secondary | ICD-10-CM

## 2014-05-29 DIAGNOSIS — C7949 Secondary malignant neoplasm of other parts of nervous system: Secondary | ICD-10-CM

## 2014-05-29 DIAGNOSIS — C649 Malignant neoplasm of unspecified kidney, except renal pelvis: Secondary | ICD-10-CM

## 2014-05-29 DIAGNOSIS — E43 Unspecified severe protein-calorie malnutrition: Secondary | ICD-10-CM

## 2014-05-29 DIAGNOSIS — F17200 Nicotine dependence, unspecified, uncomplicated: Secondary | ICD-10-CM

## 2014-05-29 DIAGNOSIS — R609 Edema, unspecified: Secondary | ICD-10-CM

## 2014-05-29 DIAGNOSIS — Z452 Encounter for adjustment and management of vascular access device: Secondary | ICD-10-CM

## 2014-05-29 DIAGNOSIS — C642 Malignant neoplasm of left kidney, except renal pelvis: Secondary | ICD-10-CM

## 2014-05-29 DIAGNOSIS — R600 Localized edema: Secondary | ICD-10-CM

## 2014-05-29 DIAGNOSIS — R21 Rash and other nonspecific skin eruption: Secondary | ICD-10-CM | POA: Insufficient documentation

## 2014-05-29 LAB — CBC WITH DIFFERENTIAL/PLATELET
BASO%: 0.6 % (ref 0.0–2.0)
BASOS ABS: 0.1 10*3/uL (ref 0.0–0.1)
EOS ABS: 0.2 10*3/uL (ref 0.0–0.5)
EOS%: 1.9 % (ref 0.0–7.0)
HCT: 29.5 % — ABNORMAL LOW (ref 34.8–46.6)
HEMOGLOBIN: 9.5 g/dL — AB (ref 11.6–15.9)
LYMPH%: 31.4 % (ref 14.0–49.7)
MCH: 29.8 pg (ref 25.1–34.0)
MCHC: 32.3 g/dL (ref 31.5–36.0)
MCV: 92.3 fL (ref 79.5–101.0)
MONO#: 0.8 10*3/uL (ref 0.1–0.9)
MONO%: 6.4 % (ref 0.0–14.0)
NEUT%: 59.7 % (ref 38.4–76.8)
NEUTROS ABS: 7.2 10*3/uL — AB (ref 1.5–6.5)
PLATELETS: 376 10*3/uL (ref 145–400)
RBC: 3.19 10*6/uL — AB (ref 3.70–5.45)
RDW: 17.8 % — AB (ref 11.2–14.5)
WBC: 12.1 10*3/uL — ABNORMAL HIGH (ref 3.9–10.3)
lymph#: 3.8 10*3/uL — ABNORMAL HIGH (ref 0.9–3.3)

## 2014-05-29 LAB — COMPREHENSIVE METABOLIC PANEL (CC13)
ALK PHOS: 91 U/L (ref 40–150)
ALT: <6 U/L (ref 0–55)
AST: 9 U/L (ref 5–34)
Albumin: 2.7 g/dL — ABNORMAL LOW (ref 3.5–5.0)
Anion Gap: 9 mEq/L (ref 3–11)
BILIRUBIN TOTAL: 0.33 mg/dL (ref 0.20–1.20)
BUN: 16 mg/dL (ref 7.0–26.0)
CO2: 22 mEq/L (ref 22–29)
Calcium: 8.6 mg/dL (ref 8.4–10.4)
Chloride: 106 mEq/L (ref 98–109)
Creatinine: 0.9 mg/dL (ref 0.6–1.1)
GLUCOSE: 214 mg/dL — AB (ref 70–140)
POTASSIUM: 3.9 meq/L (ref 3.5–5.1)
Sodium: 136 mEq/L (ref 136–145)
Total Protein: 6.8 g/dL (ref 6.4–8.3)

## 2014-05-29 MED ORDER — SODIUM CHLORIDE 0.9 % IJ SOLN
10.0000 mL | INTRAMUSCULAR | Status: DC | PRN
  Administered 2014-05-29: 10 mL via INTRAVENOUS
  Filled 2014-05-29: qty 10

## 2014-05-29 MED ORDER — HEPARIN SOD (PORK) LOCK FLUSH 100 UNIT/ML IV SOLN
500.0000 [IU] | Freq: Once | INTRAVENOUS | Status: AC
  Administered 2014-05-29: 500 [IU] via INTRAVENOUS
  Filled 2014-05-29: qty 5

## 2014-05-29 NOTE — Assessment & Plan Note (Signed)
She has no new neurological deficit. She has MRI scheduled for October. We will continue to follow.

## 2014-05-29 NOTE — Telephone Encounter (Signed)
, °

## 2014-05-29 NOTE — Assessment & Plan Note (Signed)
This is improving. Continue close monitoring.

## 2014-05-29 NOTE — Assessment & Plan Note (Signed)
She tolerated chemotherapy well. I will order staging CT scan at the end of September. We will continue treatment without dosage adjustment.

## 2014-05-29 NOTE — Progress Notes (Signed)
Kaylee [name] OFFICE PROGRESS NOTE  Patient Care Team: [name], MD as PCP - General (Internal Medicine) Heath Lark, MD as Consulting Physician (Hematology and Oncology)  SUMMARY OF ONCOLOGIC HISTORY: Oncology History   Renal cell carcinoma   Primary site: Kidney (Left)   Staging method: AJCC 7th Edition   Clinical: Stage IV (T3a, N0, M1) signed by Heath Lark, MD on 10/21/2013  8:46 AM   Pathologic: Stage IV (T3a, N0, M1) signed by Heath Lark, MD on 10/21/2013  8:47 AM   Summary: Stage IV (T3a, N0, M1)       Renal cell carcinoma   10/30/2010 Imaging Negative CT abdomen   09/15/2013 Surgery Patient underwent left nephrectomy which confirmed RCC, Fuhrman grade III/IV   09/27/2013 Imaging CT abdomen showed liver metastasis   09/29/2013 Procedure CT guided liver biopsy confrimed metastatic disease   09/29/2013 Imaging CT chest showed enlarging pulmonary metastases   09/30/2013 - 10/10/2013 Hospital Admission Admitted for orthostatic hypotension and headaches and severe malnutrition   10/21/2013 - 10/23/2013 Hospital Admission She was admitted to the hospital with recurrent urinary tract infection, dehydration, severe headaches and adrenal insufficiency.   11/07/2013 - 12/26/2013 Chemotherapy The patient begun palliative chemotherapy with weekly Temsirolimus   01/01/2014 Imaging Repeat CT scan show mixed response to treatment.   01/23/2014 - 04/20/2014 Chemotherapy The patient was started on sunitinib. Treatment is discontinued due to progression.   01/29/2014 Imaging MRI of the head show a new brain lesion   02/11/2014 -  Radiation Therapy She received one dose of stereotactic radiation therapy to the brain metastasis   04/21/2014 Imaging Unfortunately, CT scan showed disease progression.   05/07/2014 -  Chemotherapy Chemotherapy is switched to Pazopanib    INTERVAL HISTORY: Please see below for problem oriented charting. She is seen for toxicity review after her chemotherapy was  switched. She denies side effects from treatment. Her appetite is stable. She has lost weight but predominantly fluid weight. She denies new neurological deficits. Denies recent hematuria. She complained of a new skin rash on her chest wall. She had no skin rash elsewhere. It is mildly itchy. REVIEW OF SYSTEMS:   Constitutional: Denies fevers, chills  Eyes: Denies blurriness of vision Ears, nose, mouth, throat, and face: Denies mucositis or sore throat Respiratory: Denies cough, dyspnea or wheezes Cardiovascular: Denies palpitation, chest discomfort  Gastrointestinal:  Denies nausea, heartburn or change in bowel habits Lymphatics: Denies new lymphadenopathy or easy bruising Neurological:Denies numbness, tingling or new weaknesses Behavioral/Psych: Mood is stable, no new changes  All other systems were reviewed with the patient and are negative.  I have reviewed the past medical history, past surgical history, social history and family history with the patient and they are unchanged from previous note.  ALLERGIES:  is allergic to penicillins and codeine.  MEDICATIONS:  Current Outpatient Prescriptions  Medication Sig Dispense Refill  . butalbital-acetaminophen-caffeine (FIORICET) 50-325-40 MG per tablet Take 1 tablet by mouth every 6 (six) hours as needed for headache.  30 tablet  0  . dexamethasone (DECADRON) 1 MG tablet Take 1 tablet (1 mg total) by mouth daily.  60 tablet  3  . furosemide (LASIX) 20 MG tablet Take 1 tablet (20 mg total) by mouth 2 (two) times daily.  60 tablet  1  . lidocaine-prilocaine (EMLA) cream Apply 1 application topically as needed (port).      . metFORMIN (GLUCOPHAGE) 500 MG tablet TAKE 1 TABLET (500 MG TOTAL) BY MOUTH DAILY WITH BREAKFAST.  Kaylee [name]  tablet  1  . mirtazapine (REMERON) 15 MG tablet Take 1 tablet (15 mg total) by mouth at bedtime.  30 tablet  3  . Oxycodone HCl 10 MG TABS Take 1 tablet (10 mg total) by mouth every 3 (three) hours as needed (pain).  60  tablet  0  . PARoxetine (PAXIL) 20 MG tablet Take 20 mg by mouth every evening.      . pazopanib (VOTRIENT) 200 MG tablet Take 2 tablets (400 mg total) by mouth daily. Take on an empty stomach.  4 tablet  0  . polyethylene glycol (MIRALAX / GLYCOLAX) packet Take 17 g by mouth daily.      . promethazine (PHENERGAN) 25 MG tablet Take 25 mg by mouth every 6 (six) hours as needed for nausea.       No current facility-administered medications for this visit.   Facility-Administered Medications Ordered in Other Visits  Medication Dose Route Frequency Provider Last Rate Last Dose  . sodium chloride 0.9 % injection 10 mL  10 mL Intravenous PRN Heath Lark, MD   10 mL at 03/20/14 1424    PHYSICAL EXAMINATION: ECOG PERFORMANCE STATUS: 1 - Symptomatic but completely ambulatory  Filed Vitals:   05/29/14 1250  BP: 136/69  Pulse: 84  Temp: 98.2 F (36.8 C)  Resp: 18   Filed Weights   05/29/14 1250  Weight: 131 lb 3.2 oz (59.512 kg)    GENERAL:alert, no distress and comfortable. She looks thin and mildly cachectic SKIN: skin color, texture, turgor are normal, no rashes or significant lesions EYES: normal, Conjunctiva are pink and non-injected, sclera clear OROPHARYNX:no exudate, no erythema and lips, buccal mucosa, and tongue normal  NECK: supple, thyroid normal size, non-tender, without nodularity LYMPH:  no palpable lymphadenopathy in the cervical, axillary or inguinal LUNGS: clear to auscultation and percussion with normal breathing effort HEART: regular rate & rhythm and no murmurs trace bilateral lower extremity edema ABDOMEN:abdomen soft, non-tender and normal bowel sounds Musculoskeletal:no cyanosis of digits and no clubbing  NEURO: alert & oriented x 3 with fluent speech, no focal motor/sensory deficits  LABORATORY DATA:  I have reviewed the data as listed    Component Value Date/Time   NA 136 05/29/2014 1213   NA 135* 10/23/2013 0409   K 3.9 05/29/2014 1213   K 4.6 10/23/2013 0409    CL 99 10/23/2013 0409   CO2 22 05/29/2014 1213   CO2 26 10/23/2013 0409   GLUCOSE 214* 05/29/2014 1213   GLUCOSE 215* 10/23/2013 0409   BUN 16.0 05/29/2014 1213   BUN 16 10/23/2013 0409   CREATININE 0.9 05/29/2014 1213   CREATININE 0.74 10/23/2013 0409   CALCIUM 8.6 05/29/2014 1213   CALCIUM 9.0 10/23/2013 0409   PROT 6.8 05/29/2014 1213   PROT 7.0 10/15/2013 1316   ALBUMIN 2.7* 05/29/2014 1213   ALBUMIN 3.5 10/15/2013 1316   AST 9 05/29/2014 1213   AST 12 10/15/2013 1316   ALT <6 05/29/2014 1213   ALT 10 10/15/2013 1316   ALKPHOS 91 05/29/2014 1213   ALKPHOS 81 10/15/2013 1316   BILITOT 0.33 05/29/2014 1213   BILITOT 0.6 10/15/2013 1316   GFRNONAA 82* 10/23/2013 0409   GFRAA >90 10/23/2013 0409    No results found for this basename: SPEP, UPEP,  kappa and lambda light chains    Lab Results  Component Value Date   WBC 12.1* 05/29/2014   NEUTROABS 7.2* 05/29/2014   HGB 9.5* 05/29/2014   HCT 29.5* 05/29/2014   MCV  92.3 05/29/2014   PLT 376 05/29/2014      Chemistry      Component Value Date/Time   NA 136 05/29/2014 1213   NA 135* 10/23/2013 0409   K 3.9 05/29/2014 1213   K 4.6 10/23/2013 0409   CL 99 10/23/2013 0409   CO2 22 05/29/2014 1213   CO2 26 10/23/2013 0409   BUN 16.0 05/29/2014 1213   BUN 16 10/23/2013 0409   CREATININE 0.9 05/29/2014 1213   CREATININE 0.74 10/23/2013 0409      Component Value Date/Time   CALCIUM 8.6 05/29/2014 1213   CALCIUM 9.0 10/23/2013 0409   ALKPHOS 91 05/29/2014 1213   ALKPHOS 81 10/15/2013 1316   AST 9 05/29/2014 1213   AST 12 10/15/2013 1316   ALT <6 05/29/2014 1213   ALT 10 10/15/2013 1316   BILITOT 0.33 05/29/2014 1213   BILITOT 0.6 10/15/2013 1316     ASSESSMENT & PLAN:  Renal cell carcinoma She tolerated chemotherapy well. I will order staging CT scan at the end of September. We will continue treatment without dosage adjustment.  Solitary 7 mm right frontal brain metastasis She has no new neurological deficit. She has MRI scheduled for October. We will continue to follow.  Tobacco  abuse I spent some time counseling the patient the importance of tobacco cessation. she is currently attempting to quit on her own    Severe protein-calorie malnutrition Her weight is stable. I will continue dexamethasone 1 mg daily. She felt that the dexamethasone is helping with appetite.    Leg edema This is improving. Continue close monitoring.  Rash The cause is unknown and it appears like dermatitis. I recommend hydrocortisone cream as needed.   Orders Placed This Encounter  Procedures  . CT Chest W Contrast    WT 131/NO NEEDS/DIAB/NKDA TO IV DYE/BUN 16 AND CREAT  0.9  DRAWN IN OFC 05-29-14/OFC TO GIVE PT ORAL/HUMANA MCR/PLM AND ANN WITH EPIC ORDER    Standing Status: Future     Number of Occurrences:      Standing Expiration Date: 07/29/2015    Order Specific Question:  Reason for Exam (SYMPTOM  OR DIAGNOSIS REQUIRED)    Answer:  staging metastatic kidney ca    Order Specific Question:  Preferred imaging location?    Answer:  External     Comments:  [facility]  . CT Abdomen Pelvis Wo Contrast    Standing Status: Future     Number of Occurrences:      Standing Expiration Date: 08/29/2015    Order Specific Question:  Reason for Exam (SYMPTOM  OR DIAGNOSIS REQUIRED)    Answer:  staging metastatic kidney ca    Order Specific Question:  Preferred imaging location?    Answer:  External     Comments:  [facility] [facility]  . T4, free    Standing Status: Future     Number of Occurrences:      Standing Expiration Date: 07/03/2015  . TSH    Standing Status: Future     Number of Occurrences:      Standing Expiration Date: 07/03/2015   All questions were answered. The patient knows to call the clinic with any problems, questions or concerns. No barriers to learning was detected. I spent 30 minutes counseling the patient face to face. The total time spent in the appointment was 40 minutes and more than 50% was on counseling and review of test results     [facility], Kaylee Pates,  MD 05/29/2014 1:57  PM

## 2014-05-29 NOTE — Assessment & Plan Note (Signed)
The cause is unknown and it appears like dermatitis. I recommend hydrocortisone cream as needed.

## 2014-05-29 NOTE — Assessment & Plan Note (Signed)
Her weight is stable. I will continue dexamethasone 1 mg daily. She felt that the dexamethasone is helping with appetite.

## 2014-05-29 NOTE — Assessment & Plan Note (Signed)
I spent some time counseling the patient the importance of tobacco cessation. she is currently attempting to quit on her own 

## 2014-05-31 ENCOUNTER — Other Ambulatory Visit: Payer: Self-pay | Admitting: Hematology and Oncology

## 2014-06-25 ENCOUNTER — Ambulatory Visit: Payer: Medicare HMO | Attending: Internal Medicine

## 2014-07-07 ENCOUNTER — Telehealth: Payer: Self-pay | Admitting: *Deleted

## 2014-07-07 NOTE — Telephone Encounter (Signed)
Pt called to inform office of need to have paperwork from [employer] filled out and signed again to get her Votrient.  Pt came by office and met w/ Carmelina Noun from our managed care dept to complete her part of the paperwork.  Dr. Alvy Bimler signed her part and it was given back to [facility].

## 2014-07-21 ENCOUNTER — Telehealth: Payer: Self-pay | Admitting: *Deleted

## 2014-07-21 DIAGNOSIS — C7800 Secondary malignant neoplasm of unspecified lung: Secondary | ICD-10-CM

## 2014-07-21 DIAGNOSIS — C649 Malignant neoplasm of unspecified kidney, except renal pelvis: Secondary | ICD-10-CM

## 2014-07-21 NOTE — Telephone Encounter (Signed)
[facility] needs lab on pt prior to CT tomorrow afternoon.   Left VM for GI and for pt to call nurse back.  Will move her lab/flush appt on 10/02 to tomorrow before her CT scan.

## 2014-07-21 NOTE — Telephone Encounter (Signed)
Per Dr. Alvy Bimler,  CT scans should be without contrast.  Order changed to w/o contrast.  No lab needed prior to CT now.  Informed Rebecca at GI of no contrast.  Instructed pt to keep appt as scheduled.  No lab needed tomorrow.  Keep her appt w/ [place] on Friday at 1:45 pm as scheduled. She verbalized understanding.  FYI- Pt states requested CT at GI because her co pay there is only $45 as opposed to $245 if she has it done at [facility].

## 2014-07-22 ENCOUNTER — Ambulatory Visit (HOSPITAL_COMMUNITY): Payer: Commercial Managed Care - HMO

## 2014-07-22 ENCOUNTER — Ambulatory Visit
Admission: RE | Admit: 2014-07-22 | Discharge: 2014-07-22 | Disposition: A | Discharge status: Home / Self Care | Payer: Commercial Managed Care - HMO | Source: Ambulatory Visit | Attending: Hematology and Oncology | Admitting: Hematology and Oncology

## 2014-07-22 ENCOUNTER — Other Ambulatory Visit (HOSPITAL_COMMUNITY): Payer: Commercial Managed Care - HMO

## 2014-07-22 ENCOUNTER — Other Ambulatory Visit: Payer: Commercial Managed Care - HMO

## 2014-07-22 ENCOUNTER — Inpatient Hospital Stay: Admission: RE | Admit: 2014-07-22 | Payer: Commercial Managed Care - HMO | Source: Ambulatory Visit

## 2014-07-22 DIAGNOSIS — C7800 Secondary malignant neoplasm of unspecified lung: Secondary | ICD-10-CM

## 2014-07-22 DIAGNOSIS — C649 Malignant neoplasm of unspecified kidney, except renal pelvis: Secondary | ICD-10-CM

## 2014-07-23 ENCOUNTER — Ambulatory Visit: Payer: Medicare HMO | Attending: Internal Medicine

## 2014-07-24 ENCOUNTER — Encounter: Payer: Self-pay | Admitting: Hematology and Oncology

## 2014-07-24 ENCOUNTER — Ambulatory Visit (HOSPITAL_BASED_OUTPATIENT_CLINIC_OR_DEPARTMENT_OTHER): Payer: Commercial Managed Care - HMO

## 2014-07-24 ENCOUNTER — Ambulatory Visit (HOSPITAL_BASED_OUTPATIENT_CLINIC_OR_DEPARTMENT_OTHER): Payer: Commercial Managed Care - HMO | Admitting: Hematology and Oncology

## 2014-07-24 ENCOUNTER — Telehealth: Payer: Self-pay | Admitting: *Deleted

## 2014-07-24 ENCOUNTER — Other Ambulatory Visit (HOSPITAL_BASED_OUTPATIENT_CLINIC_OR_DEPARTMENT_OTHER): Payer: Commercial Managed Care - HMO

## 2014-07-24 VITALS — BP 113/68 | HR 92 | Temp 98.0°F | Resp 18 | Ht 67.0 in | Wt 137.7 lb

## 2014-07-24 DIAGNOSIS — Z95828 Presence of other vascular implants and grafts: Secondary | ICD-10-CM

## 2014-07-24 DIAGNOSIS — C787 Secondary malignant neoplasm of liver and intrahepatic bile duct: Secondary | ICD-10-CM

## 2014-07-24 DIAGNOSIS — C649 Malignant neoplasm of unspecified kidney, except renal pelvis: Secondary | ICD-10-CM

## 2014-07-24 DIAGNOSIS — C642 Malignant neoplasm of left kidney, except renal pelvis: Secondary | ICD-10-CM

## 2014-07-24 DIAGNOSIS — C7931 Secondary malignant neoplasm of brain: Secondary | ICD-10-CM

## 2014-07-24 DIAGNOSIS — C7949 Secondary malignant neoplasm of other parts of nervous system: Secondary | ICD-10-CM

## 2014-07-24 DIAGNOSIS — Z452 Encounter for adjustment and management of vascular access device: Secondary | ICD-10-CM

## 2014-07-24 DIAGNOSIS — C7800 Secondary malignant neoplasm of unspecified lung: Secondary | ICD-10-CM

## 2014-07-24 LAB — T4, FREE: FREE T4: 1.06 ng/dL (ref 0.80–1.80)

## 2014-07-24 LAB — TSH CHCC: TSH: 0.643 m(IU)/L (ref 0.308–3.960)

## 2014-07-24 LAB — COMPREHENSIVE METABOLIC PANEL (CC13)
ALT: 9 U/L (ref 0–55)
AST: 8 U/L (ref 5–34)
Albumin: 2.7 g/dL — ABNORMAL LOW (ref 3.5–5.0)
Alkaline Phosphatase: 132 U/L (ref 40–150)
Anion Gap: 11 mEq/L (ref 3–11)
BILIRUBIN TOTAL: 0.29 mg/dL (ref 0.20–1.20)
BUN: 27.3 mg/dL — AB (ref 7.0–26.0)
CO2: 26 mEq/L (ref 22–29)
Calcium: 9 mg/dL (ref 8.4–10.4)
Chloride: 100 mEq/L (ref 98–109)
Creatinine: 1.3 mg/dL — ABNORMAL HIGH (ref 0.6–1.1)
GLUCOSE: 294 mg/dL — AB (ref 70–140)
Potassium: 3.3 mEq/L — ABNORMAL LOW (ref 3.5–5.1)
Sodium: 137 mEq/L (ref 136–145)
TOTAL PROTEIN: 6.9 g/dL (ref 6.4–8.3)

## 2014-07-24 LAB — CBC WITH DIFFERENTIAL/PLATELET
BASO%: 1 % (ref 0.0–2.0)
BASOS ABS: 0.1 10*3/uL (ref 0.0–0.1)
EOS ABS: 0.1 10*3/uL (ref 0.0–0.5)
EOS%: 0.7 % (ref 0.0–7.0)
HEMATOCRIT: 36.2 % (ref 34.8–46.6)
HEMOGLOBIN: 11.8 g/dL (ref 11.6–15.9)
LYMPH%: 35.2 % (ref 14.0–49.7)
MCH: 29 pg (ref 25.1–34.0)
MCHC: 32.6 g/dL (ref 31.5–36.0)
MCV: 88.9 fL (ref 79.5–101.0)
MONO#: 1.4 10*3/uL — AB (ref 0.1–0.9)
MONO%: 8.8 % (ref 0.0–14.0)
NEUT%: 54.3 % (ref 38.4–76.8)
NEUTROS ABS: 8.3 10*3/uL — AB (ref 1.5–6.5)
PLATELETS: 260 10*3/uL (ref 145–400)
RBC: 4.08 10*6/uL (ref 3.70–5.45)
RDW: 17.6 % — ABNORMAL HIGH (ref 11.2–14.5)
WBC: 15.3 10*3/uL — AB (ref 3.9–10.3)
lymph#: 5.4 10*3/uL — ABNORMAL HIGH (ref 0.9–3.3)

## 2014-07-24 MED ORDER — SODIUM CHLORIDE 0.9 % IJ SOLN
10.0000 mL | INTRAMUSCULAR | Status: DC | PRN
Start: 2014-07-24 — End: 2014-07-24
  Administered 2014-07-24: 10 mL via INTRAVENOUS
  Filled 2014-07-24: qty 10

## 2014-07-24 MED ORDER — HEPARIN SOD (PORK) LOCK FLUSH 100 UNIT/ML IV SOLN
500.0000 [IU] | Freq: Once | INTRAVENOUS | Status: AC
  Administered 2014-07-24: 500 [IU] via INTRAVENOUS
  Filled 2014-07-24: qty 5

## 2014-07-24 NOTE — Progress Notes (Signed)
[place] OFFICE PROGRESS NOTE  Patient Care Team: [name], MD as PCP - General (Internal Medicine) Heath Lark, MD as Consulting Physician (Hematology and Oncology)  SUMMARY OF ONCOLOGIC HISTORY: Oncology History   Renal cell carcinoma   Primary site: Kidney (Left)   Staging method: AJCC 7th Edition   Clinical: Stage IV (T3a, N0, M1) signed by Heath Lark, MD on 10/21/2013  8:46 AM   Pathologic: Stage IV (T3a, N0, M1) signed by Heath Lark, MD on 10/21/2013  8:47 AM   Summary: Stage IV (T3a, N0, M1)       Renal cell carcinoma   10/30/2010 Imaging Negative CT abdomen   09/15/2013 Surgery Patient underwent left nephrectomy which confirmed RCC, Fuhrman grade III/IV   09/27/2013 Imaging CT abdomen showed liver metastasis   09/29/2013 Procedure CT guided liver biopsy confrimed metastatic disease   09/29/2013 Imaging CT chest showed enlarging pulmonary metastases   09/30/2013 - 10/10/2013 Hospital Admission Admitted for orthostatic hypotension and headaches and severe malnutrition   10/21/2013 - 10/23/2013 Hospital Admission She was admitted to the hospital with recurrent urinary tract infection, dehydration, severe headaches and adrenal insufficiency.   11/07/2013 - 12/26/2013 Chemotherapy The patient begun palliative chemotherapy with weekly Temsirolimus   01/01/2014 Imaging Repeat CT scan show mixed response to treatment.   01/23/2014 - 04/20/2014 Chemotherapy The patient was started on sunitinib. Treatment is discontinued due to progression.   01/29/2014 Imaging MRI of the head show a new brain lesion   02/11/2014 -  Radiation Therapy She received one dose of stereotactic radiation therapy to the brain metastasis   04/21/2014 Imaging Unfortunately, CT scan showed disease progression.   05/07/2014 - 07/24/2014 Chemotherapy Chemotherapy is switched to Pazopanib   07/22/2014 Imaging Repeat CT scan of the chest, abdomen and pelvis show mixed response with improvement in the lung metastasis  but progression in the abdomen.    INTERVAL HISTORY: Please see below for problem oriented charting. The patient had recurrent fall recently. She denies recent major injuries or fractures. She had extensive bruises. Her appetite is stable. She complained of occasional weakness and dizziness.  REVIEW OF SYSTEMS:   Constitutional: Denies fevers, chills or abnormal weight loss Eyes: Denies blurriness of vision Ears, nose, mouth, throat, and face: Denies mucositis or sore throat Respiratory: Denies cough, dyspnea or wheezes Cardiovascular: Denies palpitation, chest discomfort or lower extremity swelling Gastrointestinal:  Denies nausea, heartburn or change in bowel habits Skin: Denies abnormal skin rashes Lymphatics: Denies new lymphadenopathy  Neurological:Denies numbness, tingling  Behavioral/Psych: Mood is stable, no new changes  All other systems were reviewed with the patient and are negative.  I have reviewed the past medical history, past surgical history, social history and family history with the patient and they are unchanged from previous note.  ALLERGIES:  is allergic to penicillins and codeine.  MEDICATIONS:  Current Outpatient Prescriptions  Medication Sig Dispense Refill  . butalbital-acetaminophen-caffeine (FIORICET) 50-325-40 MG per tablet Take 1 tablet by mouth every 6 (six) hours as needed for headache.  30 tablet  0  . dexamethasone (DECADRON) 1 MG tablet Take 1 tablet (1 mg total) by mouth daily.  60 tablet  3  . furosemide (LASIX) 20 MG tablet Take 1 tablet (20 mg total) by mouth 2 (two) times daily.  60 tablet  1  . lidocaine-prilocaine (EMLA) cream Apply 1 application topically as needed (port).      . metFORMIN (GLUCOPHAGE) 500 MG tablet TAKE 1 TABLET (500 MG TOTAL) BY MOUTH  DAILY WITH BREAKFAST.  30 tablet  1  . mirtazapine (REMERON) 15 MG tablet TAKE 1 TABLET BY MOUTH AT BEDTIME  30 tablet  3  . Oxycodone HCl 10 MG TABS Take 1 tablet (10 mg total) by mouth  every 3 (three) hours as needed (pain).  60 tablet  0  . PARoxetine (PAXIL) 20 MG tablet Take 20 mg by mouth every evening.      . pazopanib (VOTRIENT) 200 MG tablet Take 2 tablets (400 mg total) by mouth daily. Take on an empty stomach.  4 tablet  0  . polyethylene glycol (MIRALAX / GLYCOLAX) packet Take 17 g by mouth daily.      . promethazine (PHENERGAN) 25 MG tablet Take 25 mg by mouth every 6 (six) hours as needed for nausea.       No current facility-administered medications for this visit.   Facility-Administered Medications Ordered in Other Visits  Medication Dose Route Frequency Provider Last Rate Last Dose  . sodium chloride 0.9 % injection 10 mL  10 mL Intravenous PRN Heath Lark, MD   10 mL at 03/20/14 1424    PHYSICAL EXAMINATION: ECOG PERFORMANCE STATUS: 2 - Symptomatic, <50% confined to bed  Filed Vitals:   07/24/14 1414  BP: 113/68  Pulse: 92  Temp: 98 F (36.7 C)  Resp: 18   Filed Weights   07/24/14 1414  Weight: 137 lb 11.2 oz (62.46 kg)    GENERAL:alert, no distress and comfortable SKIN: Extensive bruises are noted. EYES: normal, Conjunctiva are pink and non-injected, sclera clear OROPHARYNX:no exudate, no erythema and lips, buccal mucosa, and tongue normal  NECK: supple, thyroid normal size, non-tender, without nodularity LYMPH:  no palpable lymphadenopathy in the cervical, axillary or inguinal LUNGS: clear to auscultation and percussion with normal breathing effort HEART: regular rate & rhythm and no murmurs and no lower extremity edema ABDOMEN:abdomen soft, non-tender and normal bowel sounds Musculoskeletal:no cyanosis of digits and no clubbing  NEURO: alert & oriented x 3 with fluent speech, no focal motor/sensory deficits  LABORATORY DATA:  I have reviewed the data as listed    Component Value Date/Time   NA 137 07/24/2014 1352   NA 135* 10/23/2013 0409   K 3.3* 07/24/2014 1352   K 4.6 10/23/2013 0409   CL 99 10/23/2013 0409   CO2 26 07/24/2014 1352    CO2 26 10/23/2013 0409   GLUCOSE 294* 07/24/2014 1352   GLUCOSE 215* 10/23/2013 0409   BUN 27.3* 07/24/2014 1352   BUN 16 10/23/2013 0409   CREATININE 1.3* 07/24/2014 1352   CREATININE 0.74 10/23/2013 0409   CALCIUM 9.0 07/24/2014 1352   CALCIUM 9.0 10/23/2013 0409   PROT 6.9 07/24/2014 1352   PROT 7.0 10/15/2013 1316   ALBUMIN 2.7* 07/24/2014 1352   ALBUMIN 3.5 10/15/2013 1316   AST 8 07/24/2014 1352   AST 12 10/15/2013 1316   ALT 9 07/24/2014 1352   ALT 10 10/15/2013 1316   ALKPHOS 132 07/24/2014 1352   ALKPHOS 81 10/15/2013 1316   BILITOT 0.29 07/24/2014 1352   BILITOT 0.6 10/15/2013 1316   GFRNONAA 82* 10/23/2013 0409   GFRAA >90 10/23/2013 0409    No results found for this basename: SPEP, UPEP,  kappa and lambda light chains    Lab Results  Component Value Date   WBC 15.3* 07/24/2014   NEUTROABS 8.3* 07/24/2014   HGB 11.8 07/24/2014   HCT 36.2 07/24/2014   MCV 88.9 07/24/2014   PLT 260 07/24/2014  Chemistry      Component Value Date/Time   NA 137 07/24/2014 1352   NA 135* 10/23/2013 0409   K 3.3* 07/24/2014 1352   K 4.6 10/23/2013 0409   CL 99 10/23/2013 0409   CO2 26 07/24/2014 1352   CO2 26 10/23/2013 0409   BUN 27.3* 07/24/2014 1352   BUN 16 10/23/2013 0409   CREATININE 1.3* 07/24/2014 1352   CREATININE 0.74 10/23/2013 0409      Component Value Date/Time   CALCIUM 9.0 07/24/2014 1352   CALCIUM 9.0 10/23/2013 0409   ALKPHOS 132 07/24/2014 1352   ALKPHOS 81 10/15/2013 1316   AST 8 07/24/2014 1352   AST 12 10/15/2013 1316   ALT 9 07/24/2014 1352   ALT 10 10/15/2013 1316   BILITOT 0.29 07/24/2014 1352   BILITOT 0.6 10/15/2013 1316       RADIOGRAPHIC STUDIES: Reviewed the imaging study with the patient and her sister. I have personally reviewed the radiological images as listed and agreed with the findings in the report. Ct Abdomen Pelvis Wo Contrast  07/22/2014   CLINICAL DATA:  Metastatic renal cell carcinoma. Abdominal pain, cough. Smoker. Prior left nephrectomy. Completed chemotherapy,  radiation therapy.  EXAM: CT CHEST, ABDOMEN AND PELVIS WITHOUT CONTRAST  TECHNIQUE: Multidetector CT imaging of the chest, abdomen and pelvis was performed following the standard protocol without IV contrast.  COMPARISON:  04/21/2014  FINDINGS: CT CHEST FINDINGS  Unenhanced CT was performed per clinician order. Lack of IV contrast limits sensitivity and specificity, especially for evaluation of abdominal/pelvic solid viscera. Decision to administer IV contrast should be based on the patient's underlying renal function and the presence of a solitary kidney is not necessarily a contraindication to administration of contrast.  Diffuse bilateral innumerable oval and round pulmonary metastatic nodules are reidentified, overall decreased in size since previously. For example, left lower lobe 1 cm pulmonary nodule image 43 is smaller, previously 2.3 cm. 0.6 cm nodule in the lingula image 39 is smaller, previously 1.4 cm. Dominant right lower lobe pulmonary nodule now measures 0.8 cm image 41, previously 2.5 cm. Central airways are patent. Evidence of prior right upper lobectomy. Bullous emphysema reidentified. No pneumothorax.  Right-sided Port-A-Cath in place with tip at the cavoatrial junction. Bilateral breast implants are noted with capsular calcification. Moderate atheromatous aortic calcification without aneurysm. Coronary arterial calcifications are noted. Stable trace pericardial fluid.  Increase in size of mediastinal lymphadenopathy, representative pretracheal node 1.2 cm image 18 compared to 0.4 cm previously. No gross evidence for hilar lymphadenopathy allowing for lack of contrast. No axillary lymphadenopathy. Heart size is normal.  CT ABDOMEN AND PELVIS FINDINGS  Increase in size of dome of medial segment left hepatic lobe mass, measuring 2.0 cm image 49, previously 1.5 cm. Dominant posterior segment right hepatic lobe mass measures 1.4 cm image 57, previously 1.1 cm.  Left nephrectomy bed mass measures  slightly smaller, now 2.2 x 1.7 cm image 56, previously 3.0 x 2.9 cm. However, there has been interval increase in predominantly left upper quadrant peritoneal metastatic nodular disease, even though the previously measured dominant mass itself is smaller measuring 0.9 x 0.9 cm image 61 compared to 2.4 x 2.2 cm previously.  Fat containing ventral abdominal wall hernia. Stable degree of infrarenal abdominal aortic ectasia without periaortic fluid. Small retroperitoneal nodes are reidentified and stable, for example 3 mm image 75. No ascites.  Adrenal glands, right kidney, pancreas, and spleen are grossly unremarkable. Gallbladder unremarkable.  No bowel wall thickening or focal segmental  dilatation. The urinary bladder is normal. Uterus presumed surgically absent. Ovaries appear normal, image 98. Small fat containing left inguinal hernia. No pelvic free fluid or lymphadenopathy.  Sclerotic lesion within the left iliac bone again noted. The bones are subjectively osteopenic. No new lytic or sclerotic osseous lesion.  IMPRESSION: Mixed response overall, with decrease in size of diffuse pulmonary metastatic nodules but interval development of pretracheal lymphadenopathy.  Increase in size of multiple hepatic masses.  Although the dominant paraspinal left nephrectomy bed region soft tissue mass has decreased in size, there is additional adjacent peritoneal nodularity compatible with local spread of disease.   Electronically Signed   By: Conchita Paris M.D.   On: 07/22/2014 16:10   Ct Chest Wo Contrast  07/22/2014   CLINICAL DATA:  Metastatic renal cell carcinoma. Abdominal pain, cough. Smoker. Prior left nephrectomy. Completed chemotherapy, radiation therapy.  EXAM: CT CHEST, ABDOMEN AND PELVIS WITHOUT CONTRAST  TECHNIQUE: Multidetector CT imaging of the chest, abdomen and pelvis was performed following the standard protocol without IV contrast.  COMPARISON:  04/21/2014  FINDINGS: CT CHEST FINDINGS  Unenhanced CT was  performed per clinician order. Lack of IV contrast limits sensitivity and specificity, especially for evaluation of abdominal/pelvic solid viscera. Decision to administer IV contrast should be based on the patient's underlying renal function and the presence of a solitary kidney is not necessarily a contraindication to administration of contrast.  Diffuse bilateral innumerable oval and round pulmonary metastatic nodules are reidentified, overall decreased in size since previously. For example, left lower lobe 1 cm pulmonary nodule image 43 is smaller, previously 2.3 cm. 0.6 cm nodule in the lingula image 39 is smaller, previously 1.4 cm. Dominant right lower lobe pulmonary nodule now measures 0.8 cm image 41, previously 2.5 cm. Central airways are patent. Evidence of prior right upper lobectomy. Bullous emphysema reidentified. No pneumothorax.  Right-sided Port-A-Cath in place with tip at the cavoatrial junction. Bilateral breast implants are noted with capsular calcification. Moderate atheromatous aortic calcification without aneurysm. Coronary arterial calcifications are noted. Stable trace pericardial fluid.  Increase in size of mediastinal lymphadenopathy, representative pretracheal node 1.2 cm image 18 compared to 0.4 cm previously. No gross evidence for hilar lymphadenopathy allowing for lack of contrast. No axillary lymphadenopathy. Heart size is normal.  CT ABDOMEN AND PELVIS FINDINGS  Increase in size of dome of medial segment left hepatic lobe mass, measuring 2.0 cm image 49, previously 1.5 cm. Dominant posterior segment right hepatic lobe mass measures 1.4 cm image 57, previously 1.1 cm.  Left nephrectomy bed mass measures slightly smaller, now 2.2 x 1.7 cm image 56, previously 3.0 x 2.9 cm. However, there has been interval increase in predominantly left upper quadrant peritoneal metastatic nodular disease, even though the previously measured dominant mass itself is smaller measuring 0.9 x 0.9 cm image 61  compared to 2.4 x 2.2 cm previously.  Fat containing ventral abdominal wall hernia. Stable degree of infrarenal abdominal aortic ectasia without periaortic fluid. Small retroperitoneal nodes are reidentified and stable, for example 3 mm image 75. No ascites.  Adrenal glands, right kidney, pancreas, and spleen are grossly unremarkable. Gallbladder unremarkable.  No bowel wall thickening or focal segmental dilatation. The urinary bladder is normal. Uterus presumed surgically absent. Ovaries appear normal, image 98. Small fat containing left inguinal hernia. No pelvic free fluid or lymphadenopathy.  Sclerotic lesion within the left iliac bone again noted. The bones are subjectively osteopenic. No new lytic or sclerotic osseous lesion.  IMPRESSION: Mixed response overall, with decrease  in size of diffuse pulmonary metastatic nodules but interval development of pretracheal lymphadenopathy.  Increase in size of multiple hepatic masses.  Although the dominant paraspinal left nephrectomy bed region soft tissue mass has decreased in size, there is additional adjacent peritoneal nodularity compatible with local spread of disease.   Electronically Signed   By: Conchita Paris M.D.   On: 07/22/2014 16:10     ASSESSMENT & PLAN:  Renal cell carcinoma Unfortunately, her prognosis is very poor. With mixed response, we need to switch her to another kind of therapy. She had already received 3 different lines of treatment. Any further palliative chemotherapy has low yield of controlling the disease. At best, we can get several months of progression free survival but this will unlikely get rid of the existing disease. The patient is getting weaker. I recommend consideration for palliative care and hospice and she agreed.   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 MR of the brain is scheduled to be done in 3 weeks. I expected possible recurrence of  disease in the brain once we stopped chemotherapy. I think there is some benefit for palliative reasons to undergo palliative radiation therapy to the brain if necessary. The patient will think about it whether she wants to cancel that or not.   No orders of the defined types were placed in this encounter.   All questions were answered. The patient knows to call the clinic with any problems, questions or concerns. No barriers to learning was detected. I spent 25 minutes counseling the patient face to face. The total time spent in the appointment was 30 minutes and more than 50% was on counseling and review of test results     [facility], [place], MD 07/24/2014 3:14 PM

## 2014-07-24 NOTE — Assessment & Plan Note (Signed)
Unfortunately, her prognosis is very poor. With mixed response, we need to switch her to another kind of therapy. She had already received 3 different lines of treatment. Any further palliative chemotherapy has low yield of controlling the disease. At best, we can get several months of progression free survival but this will unlikely get rid of the existing disease. The patient is getting weaker. I recommend consideration for palliative care and hospice and she agreed.

## 2014-07-24 NOTE — Telephone Encounter (Signed)
Referral to [place] given to [facility] at [facility] per Dr. Alvy Bimler.   Hospice MD may provide symptom management.

## 2014-07-24 NOTE — Assessment & Plan Note (Signed)
She had received stereotactic radiation therapy to the brain before. MR of the brain is scheduled to be done in 3 weeks. I expected possible recurrence of disease in the brain once we stopped chemotherapy. I think there is some benefit for palliative reasons to undergo palliative radiation therapy to the brain if necessary. The patient will think about it whether she wants to cancel that or not.

## 2014-07-24 NOTE — Patient Instructions (Signed)

## 2014-08-04 ENCOUNTER — Telehealth: Payer: Self-pay | Admitting: *Deleted

## 2014-08-04 NOTE — Telephone Encounter (Signed)
Hospice nurse called to say patient would like to be DNR, is Dr Alvy Bimler OK with Hospice MD signing DNR?  Left message for Varney Biles that Yes, Dr Alvy Bimler is OK with Hospice MD signing DNR

## 2014-08-18 ENCOUNTER — Ambulatory Visit
Admission: RE | Admit: 2014-08-18 | Discharge: 2014-08-18 | Disposition: A | Discharge status: Home / Self Care | Payer: Commercial Managed Care - HMO | Source: Ambulatory Visit | Attending: Radiation Oncology | Admitting: Radiation Oncology

## 2014-08-18 DIAGNOSIS — C7931 Secondary malignant neoplasm of brain: Secondary | ICD-10-CM

## 2014-08-18 DIAGNOSIS — C7949 Secondary malignant neoplasm of other parts of nervous system: Principal | ICD-10-CM

## 2014-08-18 MED ORDER — GADOBENATE DIMEGLUMINE 529 MG/ML IV SOLN
7.0000 mL | Freq: Once | INTRAVENOUS | Status: AC | PRN
  Administered 2014-08-18: 7 mL via INTRAVENOUS

## 2014-08-27 ENCOUNTER — Ambulatory Visit: Payer: Commercial Managed Care - HMO | Attending: Internal Medicine

## 2014-08-27 ENCOUNTER — Telehealth: Payer: Self-pay | Admitting: *Deleted

## 2014-08-27 ENCOUNTER — Other Ambulatory Visit: Payer: Self-pay | Admitting: Hematology and Oncology

## 2014-08-27 NOTE — Telephone Encounter (Signed)
Pt reports intermittent bouts of diarrhea over the past month.  It comes on suddenly and can be uncontrollable at times. Happens several times per week. Last bout was this past Sunday.  She completed Votrient last Thursday.  Pt also reports the top of her left foot is numb for the past month.

## 2014-08-27 NOTE — Telephone Encounter (Signed)
Instructed pt to take imodium as directed.  Call us if that does not work.  She verbalized understanding.

## 2014-08-27 NOTE — Telephone Encounter (Signed)
All her symptoms could be due to her abdominal disease. Recommend immodium prn for diarrhea

## 2014-08-27 NOTE — Telephone Encounter (Signed)
Left VM for pt to return nurse's call.  

## 2014-10-21 ENCOUNTER — Encounter: Payer: Self-pay | Admitting: *Deleted

## 2014-10-21 DIAGNOSIS — C7951 Secondary malignant neoplasm of bone: Secondary | ICD-10-CM | POA: Insufficient documentation

## 2014-10-21 NOTE — Progress Notes (Signed)
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 Painful Right Scapular Metastasis 198.5 C79.51     Interval Since Last Radiation:  8  months  Narrative:  The patient returns today with right shoulder pain and recently discovered right scapular bone metastasis.  Dr. Veverly Fells did MRI of the shoulder showing the 3.1 cm expansile lesion in the scapula.                     No edema of right arm noted. Block sling noted on right arm. Patient reports HOSPICE come into the home regular. Reports morphine and methadone manage pain. Reports right arm and shoulder pain 2 on a scale of 0-10. Reports taking pain medication prior to this appointment. Very limited ROM of right arm. Denies headache, dizziness, nausea, vomiting, diplopia or ringing in the ears. Taking decadron 1 mg once per day             ALLERGIES:  is allergic to penicillins and codeine.  Meds: Current Outpatient Prescriptions  Medication Sig Dispense Refill  . dexamethasone (DECADRON) 1 MG tablet Take 1 tablet (1 mg total) by mouth daily. 60 tablet 3  . furosemide (LASIX) 20 MG tablet Take 1 tablet (20 mg total) by mouth 2 (two) times daily. 60 tablet 1  . JANUVIA 100 MG tablet Take 100 mg by mouth daily.  11  . lidocaine-prilocaine (EMLA) cream Apply 1 application topically as needed (port).    . metFORMIN (GLUCOPHAGE) 500 MG tablet TAKE 1 TABLET (500 MG TOTAL) BY MOUTH DAILY WITH BREAKFAST. 30 tablet 1  . methadone (DOLOPHINE) 10 MG tablet Take 10 mg by mouth every 8 (eight) hours.    . mirtazapine (REMERON) 15 MG tablet TAKE 1 TABLET BY MOUTH AT BEDTIME 30 tablet 3  . morphine (MSIR) 30 MG tablet Take 30 mg by mouth  every 4 (four) hours as needed for severe pain.    Marland Kitchen omeprazole (PRILOSEC) 40 MG capsule Take 40 mg by mouth daily.    Marland Kitchen PARoxetine (PAXIL) 20 MG tablet Take 20 mg by mouth every evening.     No current facility-administered medications for this encounter.   Facility-Administered Medications Ordered in Other Encounters  Medication Dose Route Frequency Provider Last Rate Last Dose  . sodium chloride 0.9 % injection 10 mL  10 mL Intravenous PRN Heath Lark, MD   10 mL at 03/20/14 1424    Physical Findings: The patient is in no acute distress. Patient is alert and oriented.  height is 5' 6.5" (1.689 m) and weight is 138 lb (62.596 kg). Her blood pressure is 104/60 and her pulse is 74. Her respiration is 16. .  No significant changes.  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  Plan:  Today, I talked to the patient and family about the findings and work-up thus far.  We discussed the  natural history of painful bone metastasis and general treatment, highlighting the role of radiotherapy in the management.  We discussed the available radiation techniques, and focused on the details of logistics and delivery.  We reviewed the anticipated acute and late sequelae associated with radiation in this setting.  The patient was encouraged to ask questions that I answered to the best of my ability.  I filled out a patient counseling form during our discussion including treatment diagrams.  We retained a copy for our records.  The patient would like to proceed with radiation and will be scheduled for CT simulation for single fraction of 8 Gy.  I spent 15 minutes minutes face to face with the patient and more than 50% of that time was spent in  counseling and/or coordination of care.   _____________________________________  Sheral Apley. Tammi Klippel, M.D.

## 2014-10-21 NOTE — Progress Notes (Signed)
  Radiation Oncology         [phone] ________________________________  Name: Kaylee [name] MRN: [id number]  Date: 10/22/2014   DOB: 24-Jun-1941  SIMULATION AND TREATMENT PLANNING NOTE    ICD-9-CM ICD-10-CM   1. 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  Identity was confirmed.  All relevant records and images related to the planned course of therapy were reviewed.  The patient freely provided informed written consent to proceed with treatment after reviewing the details related to the planned course of therapy. The consent form was witnessed and verified by the simulation staff.  Then, the patient was set-up in a stable reproducible  supine position for radiation therapy.  CT images were obtained.  Surface markings were placed.  The CT images were loaded into the planning software.  Then the target and avoidance structures were contoured.  Treatment planning then occurred.  The radiation prescription was entered and confirmed.  Then, I designed and supervised the construction of a total of 3 medically necessary complex treatment devices.  These included a customized pillow mold for patient positioning as well as 2 multileaf collimator apertures to conformally shaped radiation around the expansile lesion in her right scapula shielding nearby critical tissues including the uninvolved lung and brachial plexus. I have requested : Isodose Plan.    PLAN:  The patient will receive 8 Gy in 1 fraction for palliation of pain.  ________________________________  Sheral Apley. Tammi Klippel, M.D.

## 2014-10-21 NOTE — Progress Notes (Signed)
[place] [employer] representative contacted patient to check in, offer support and remind about [facility]. Pt has been followed by hospice and getting good support from them. Pt has had more issues with pain and shared the hospice MD had been out today and adjusted her medication. She and her sister hope to attend brain group next week and still find it helpful.    Loren Racer, [facility] [employer] [place]  [place] Phone: [phone] Fax: [phone]

## 2014-10-22 ENCOUNTER — Ambulatory Visit
Admission: RE | Admit: 2014-10-22 | Discharge: 2014-10-22 | Disposition: A | Discharge status: Home / Self Care | Payer: Medicare HMO | Source: Ambulatory Visit | Attending: Radiation Oncology | Admitting: Radiation Oncology

## 2014-10-22 ENCOUNTER — Encounter: Payer: Self-pay | Admitting: Radiation Oncology

## 2014-10-22 VITALS — BP 104/60 | HR 74 | Resp 16 | Ht 66.5 in | Wt 138.0 lb

## 2014-10-22 DIAGNOSIS — Z79891 Long term (current) use of opiate analgesic: Secondary | ICD-10-CM | POA: Diagnosis not present

## 2014-10-22 DIAGNOSIS — Z7952 Long term (current) use of systemic steroids: Secondary | ICD-10-CM | POA: Insufficient documentation

## 2014-10-22 DIAGNOSIS — C7951 Secondary malignant neoplasm of bone: Secondary | ICD-10-CM

## 2014-10-22 DIAGNOSIS — Z79899 Other long term (current) drug therapy: Secondary | ICD-10-CM | POA: Insufficient documentation

## 2014-10-22 DIAGNOSIS — C649 Malignant neoplasm of unspecified kidney, except renal pelvis: Secondary | ICD-10-CM | POA: Insufficient documentation

## 2014-10-22 DIAGNOSIS — Z510 Encounter for antineoplastic radiation therapy: Secondary | ICD-10-CM | POA: Diagnosis present

## 2014-10-22 DIAGNOSIS — C7952 Secondary malignant neoplasm of bone marrow: Principal | ICD-10-CM

## 2014-10-22 HISTORY — DX: Malignant neoplasm of bone and articular cartilage, unspecified: C41.9

## 2014-10-22 NOTE — Progress Notes (Signed)
No edema of right arm noted. Block sling noted on right arm. Patient reports HOSPICE come into the home regular. Reports morphine and methadone manage pain. Reports right arm and shoulder pain 2 on a scale of 0-10. Reports taking pain medication prior to this appointment. Very limited ROM of right arm. Denies headache, dizziness, nausea, vomiting, diplopia or ringing in the ears. Taking decadron 1 mg once per day.

## 2014-10-22 NOTE — Progress Notes (Signed)
See progress note under physician encounter. 

## 2014-10-29 ENCOUNTER — Encounter: Payer: Self-pay | Admitting: Radiation Oncology

## 2014-10-29 ENCOUNTER — Ambulatory Visit
Admission: RE | Admit: 2014-10-29 | Discharge: 2014-10-29 | Disposition: A | Discharge status: Home / Self Care | Payer: Commercial Managed Care - HMO | Source: Ambulatory Visit | Attending: Radiation Oncology | Admitting: Radiation Oncology

## 2014-10-29 ENCOUNTER — Ambulatory Visit
Admission: RE | Admit: 2014-10-29 | Discharge: 2014-10-29 | Disposition: A | Discharge status: Home / Self Care | Payer: Medicare HMO | Source: Ambulatory Visit | Attending: Radiation Oncology | Admitting: Radiation Oncology

## 2014-10-29 DIAGNOSIS — C7951 Secondary malignant neoplasm of bone: Secondary | ICD-10-CM

## 2014-10-29 DIAGNOSIS — Z510 Encounter for antineoplastic radiation therapy: Secondary | ICD-10-CM | POA: Diagnosis not present

## 2014-10-29 NOTE — Progress Notes (Signed)
  Radiation Oncology         [phone] ________________________________  Name: Kaylee [name] MRN: [id number]  Date: 10/29/2014   DOB: [birth year]  Simulation Verification Note    ICD-9-CM ICD-10-CM   1. Painful Right Scapular Metastasis 198.5 C79.51     Status: outpatient  NARRATIVE: The patient was brought to the treatment unit and placed in the planned treatment position. The clinical setup was verified. Then port films were obtained and uploaded to the radiation oncology medical record software.  The treatment beams were carefully compared against the planned radiation fields. The position location and shape of the radiation fields was reviewed. They targeted volume of tissue appears to be appropriately covered by the radiation beams. Organs at risk appear to be excluded as planned.  Based on my personal review, I approved the simulation verification. The patient's treatment will proceed as planned.  ------------------------------------------------  Sheral Apley Tammi Klippel, M.D.

## 2014-10-29 NOTE — Progress Notes (Signed)
  Radiation Oncology         [phone] ________________________________  Name: Kaylee [name] MRN: [id number]  Date: 10/29/2014  DOB: 01-05-1941  Weekly Radiation Therapy Management    ICD-9-CM ICD-10-CM   1. Painful Right Scapular Metastasis 198.5 C79.51     Current Dose: 8 Gy     Planned Dose:  8 Gy  Narrative . . . . . . . . The patient presents for routine under treatment assessment.                                   The patient is without complaint.                                 Set-up films were reviewed.                                 The chart was checked. Physical Findings. . .  Weight essentially stable.  No significant changes. Impression . . . . . . . The patient is tolerating radiation. Plan . . . . . . . . . . . . Continue treatment with today's single fraction as planned.  ________________________________  Sheral Apley. Tammi Klippel, M.D.

## 2014-10-30 NOTE — Progress Notes (Signed)
  Radiation Oncology         [phone] ________________________________  Name: Kaylee [name] MRN: [id number]  Date: 10/29/2014   DOB: May 11, 1941  End of Treatment Note  Diagnosis:   74 year old woman with a Painful Right Scapular Metastasis from metastatic renal cell carcinoma-stage IV      Indication for treatment:  Palliation of Pain       Radiation treatment dates:   10/29/2014  Site/dose:   The metastatic deposit was treated to 8 Gy in one fraction  Beams/energy:   Anterior and Posterior 6 MV X-ray beams were used.  Narrative: The patient tolerated radiation treatment relatively well.   No acute complications occurred.  Plan: The patient has completed radiation treatment. The patient will return to radiation oncology clinic for routine followup in one month. I advised them to call or return sooner if they have any questions or concerns related to their recovery or treatment. ________________________________  Sheral Apley. Tammi Klippel, M.D.

## 2014-11-17 ENCOUNTER — Encounter: Payer: Self-pay | Admitting: Radiation Therapy

## 2014-11-17 NOTE — Progress Notes (Signed)
Clem expired on 11/14/14

## 2014-11-20 ENCOUNTER — Telehealth: Payer: Self-pay | Admitting: Hematology and Oncology

## 2014-11-20 NOTE — Telephone Encounter (Signed)
Received death certificate

## 2014-11-23 DEATH — deceased

## 2014-11-24 ENCOUNTER — Telehealth: Payer: Self-pay | Admitting: *Deleted

## 2014-11-24 NOTE — Telephone Encounter (Signed)
PT.'S SISTER "NEEDS TO KNOW WHO SIGNED THE DEATH CERTIFICATE."

## 2014-12-03 ENCOUNTER — Ambulatory Visit: Admitting: Radiation Oncology

## 2015-03-08 IMAGING — US ULTRASOUND BIOPSY CORE LIVER
1 series · 5 of 5 positions shown · non-contrast
Comparison: Chest CT - earlier same day; CT abdomen pelvis
-09/27/2013; PET-CT- 07/27/2013; abdominal MRI -08/14/2013

CLINICAL DATA: History of left-sided renal cell carcinoma, post
left-sided nephrectomy, now with CT findings worrisome for
progression / development of hepatic and pulmonary metastases.
Please attempted ultrasound-guided liver lesion biopsy.

EXAM:
ULTRASOUND BIOPSY CORE LIVER

[Series 1: ultrasound biopsy core liver · 0.24mm/px · 5 of 5 slices shown]
[im 1/5]
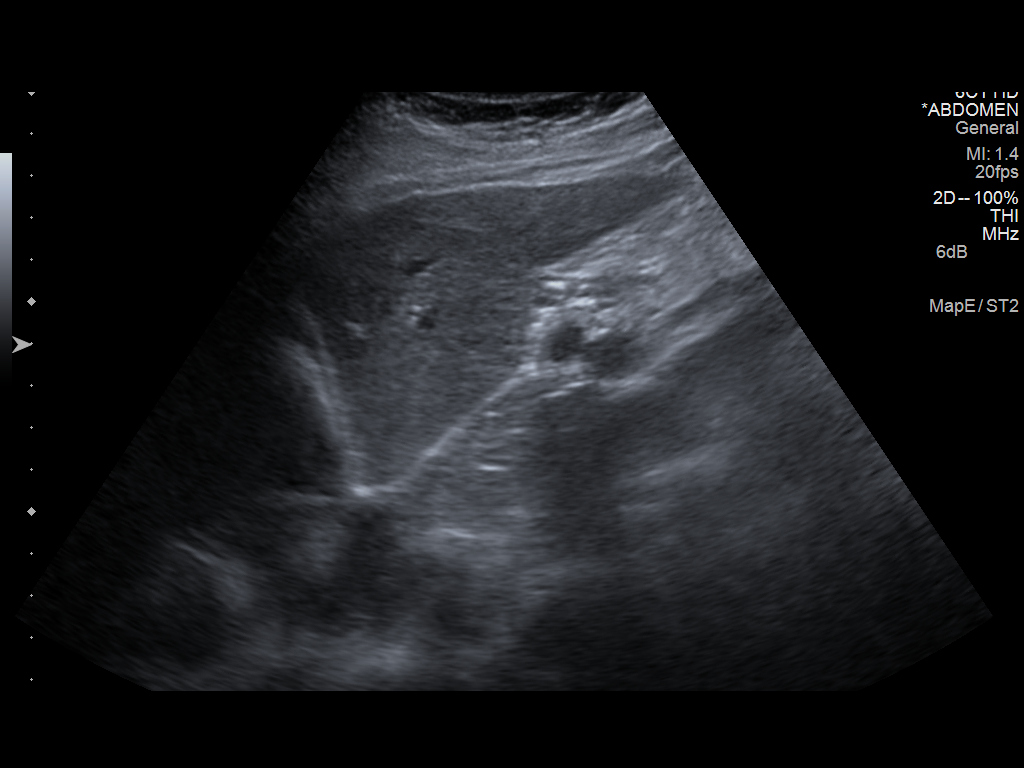
[im 2/5]
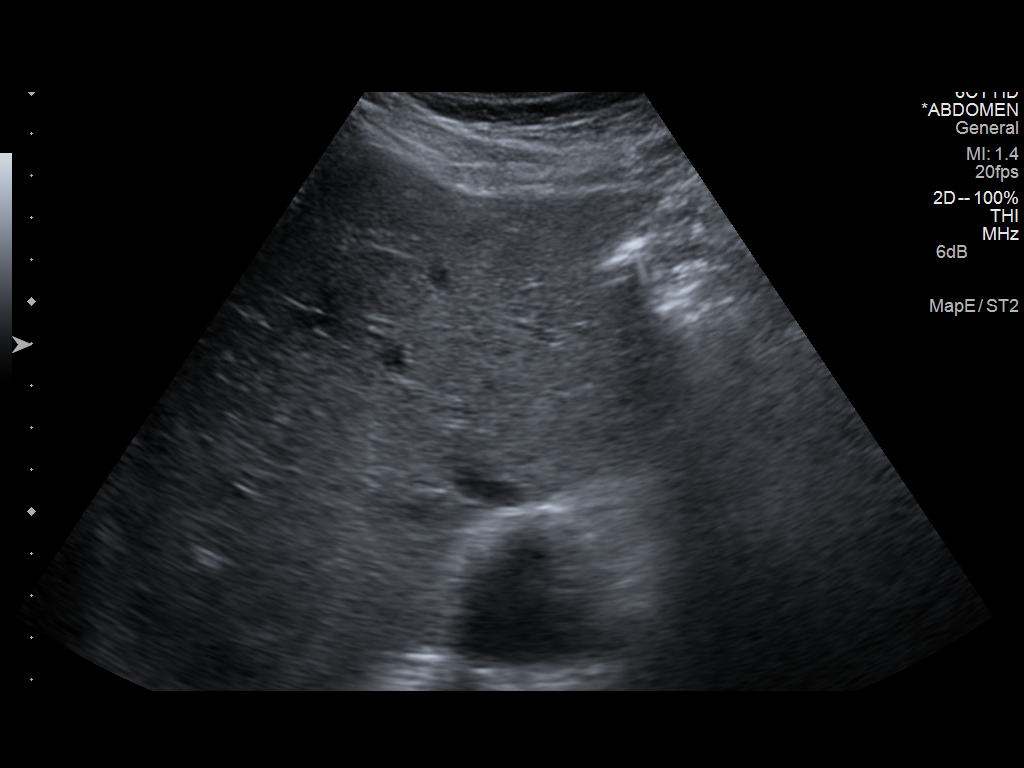
[im 3/5]
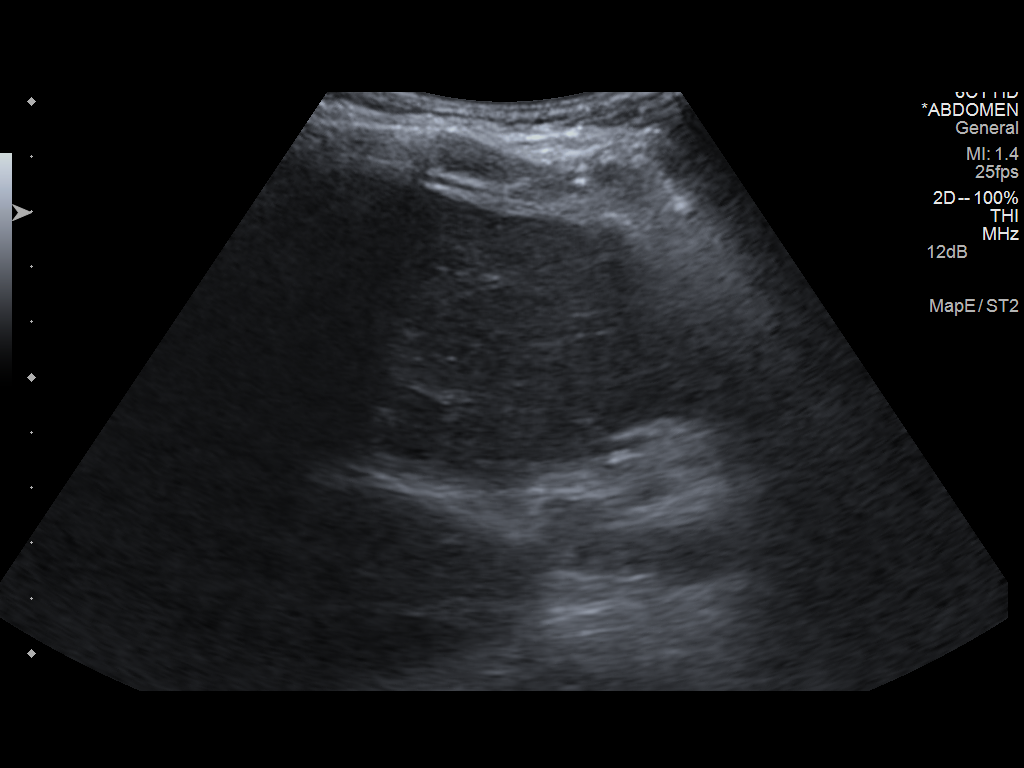
[im 4/5]
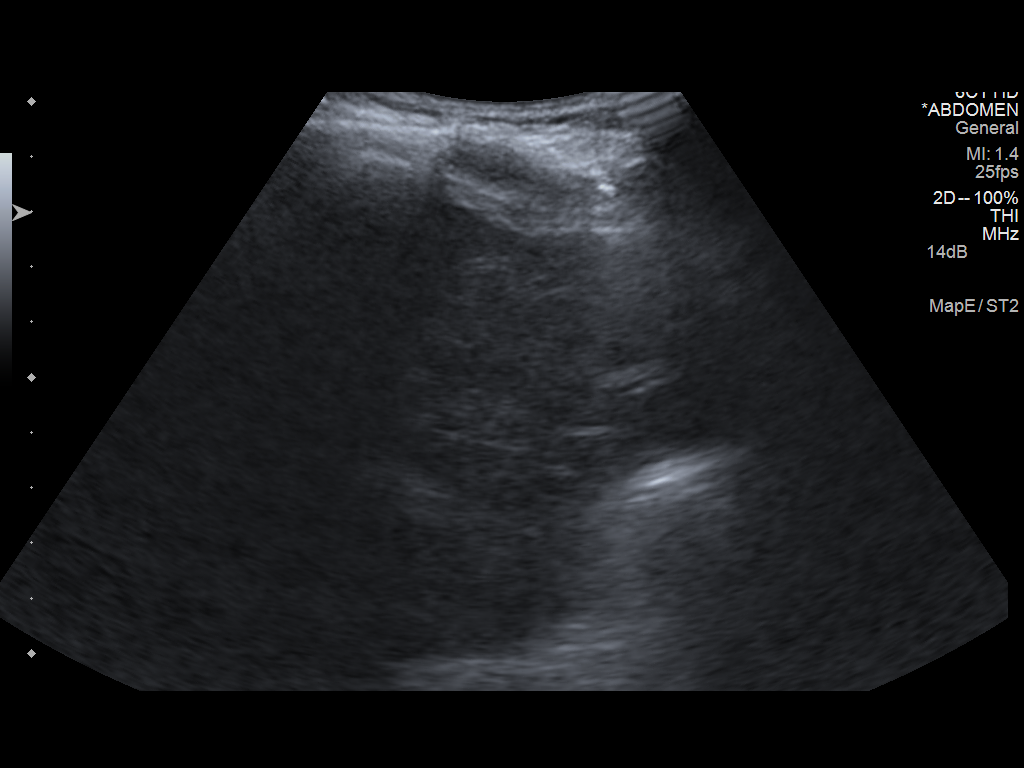
[im 5/5]
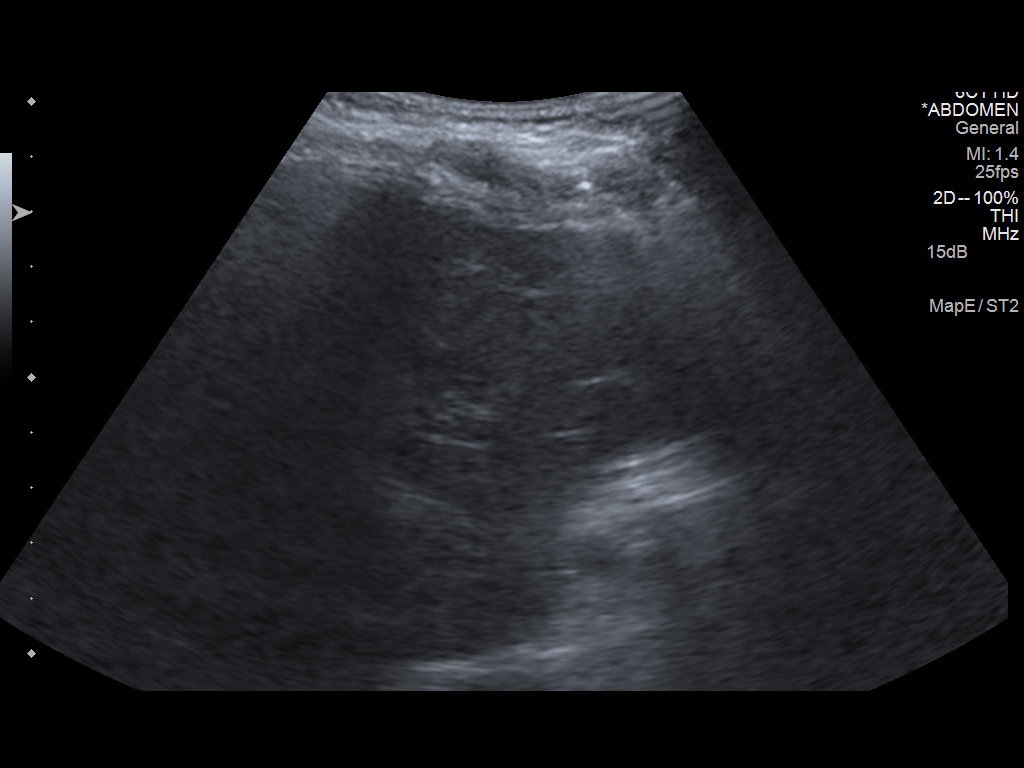

[5 of 5 positions shown; findings below may reference images not displayed]

FINDINGS: Exhaustive ultrasound scanning was performed by the dictating
radiologist however failed to definitively delineate the known
dominant hyperenhancing mass involving the anterior segment of the
dome of the right lobe of the liver, demonstrated on preprocedural
imaging. As such, the patient was brought to CT for attempted
CT-guided biopsy biopsy.
IMPRESSION: Known hyperenhancing dominant mass within the anterior segment of
the dome of the right lobe of the liver is not apparent on
ultrasound, as such, patient was escorted to CT to undergo attempted
CT-guided biopsy.

## 2015-03-08 IMAGING — CT CT BIOPSY
1 of 5 series · 20 of 32 positions shown, 27 images · IV contrast (agent unspecified)
Comparison: Chest CT - 09/29/2013;

INDICATION: History of renal cell carcinoma, post nephrectomy, now with recent
findings worrisome for progression of pulmonary and hepatic
metastatic disease.

EXAM:
CT BIOPSY OF HYPERVASCULAR MASS WITHIN THE DOME OF THE RIGHT LOBE OF
THE LIVER
MEDICATIONS:
Fentanyl 200 mcg IV; Versed 2 mg IV
ANESTHESIA/SEDATION:
Sedation time
25 minutes
CONTRAST:  None

[Series 9: (hospital) 6.0 b30f · axial · 0.75mm/px · z∈[+1275,+1292]mm · 20 of 36 slices shown, 27 images]
[im 2/36  soft-tissue]
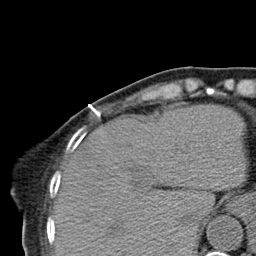
[im 2/36  bone]
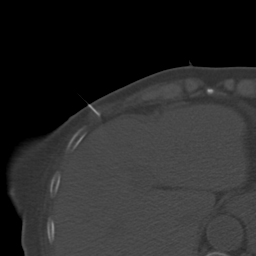
[im 4/36  soft-tissue]
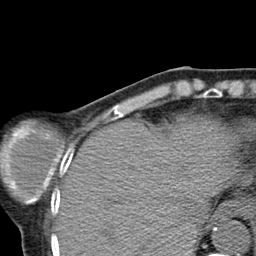
[im 6/36  soft-tissue]
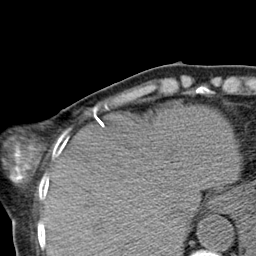
[im 7/36  soft-tissue]
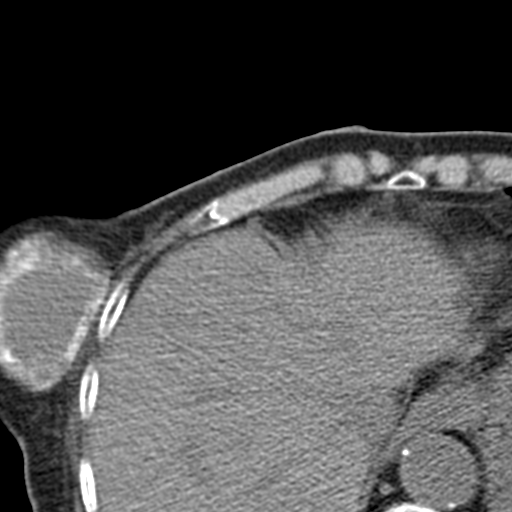
[im 9/36  soft-tissue]
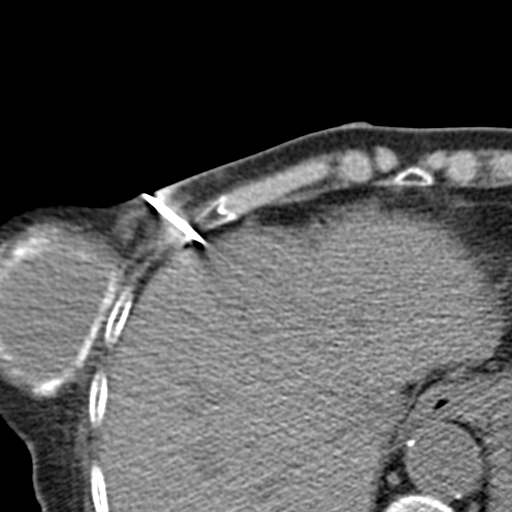
[im 11/36  soft-tissue]
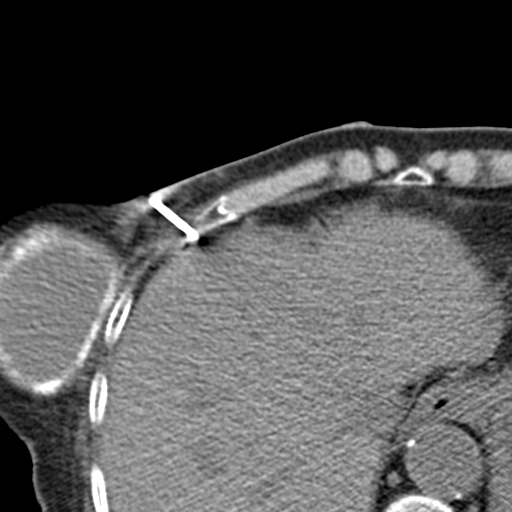
[im 12/36  soft-tissue]
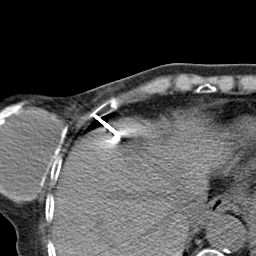
[im 14/36  soft-tissue]
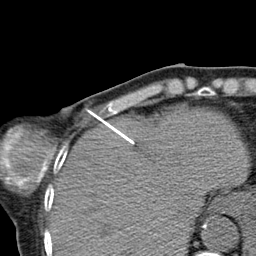
[im 16/36  soft-tissue]
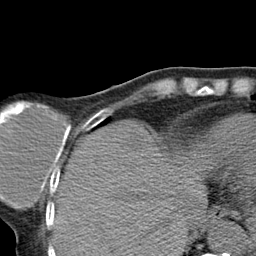
[im 16/36  bone]
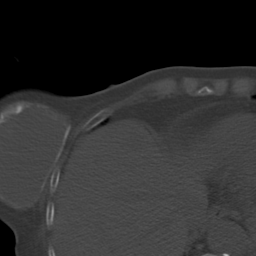
[im 17/36  soft-tissue]
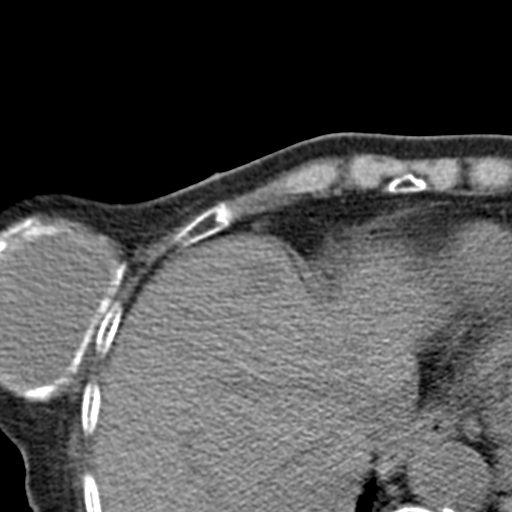
[im 19/36  soft-tissue]
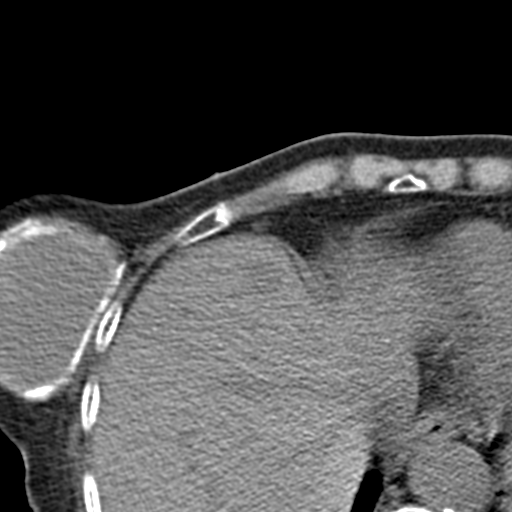
[im 21/36  soft-tissue]
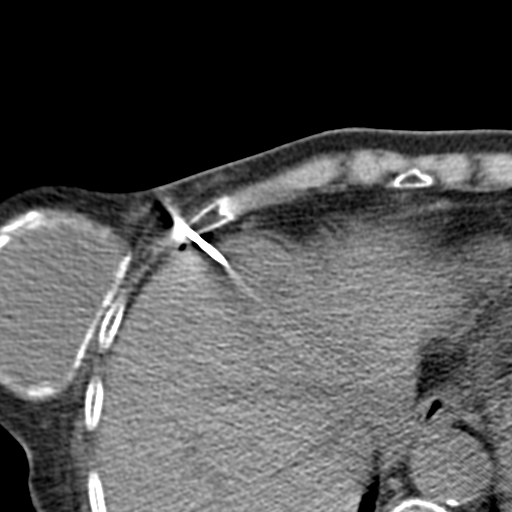
[im 22/36  soft-tissue]
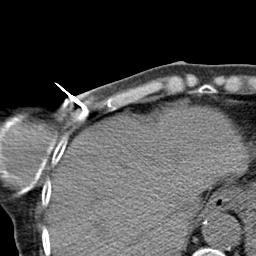
[im 24/36  soft-tissue]
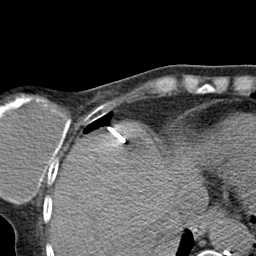
[im 26/36  soft-tissue]
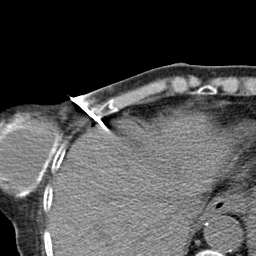
[im 27/36  soft-tissue]
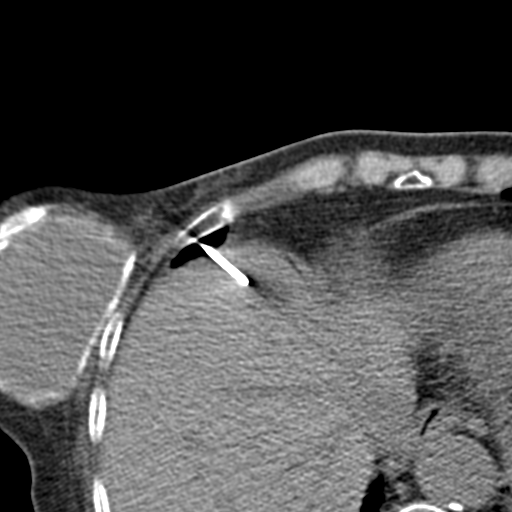
[im 29/36  soft-tissue]
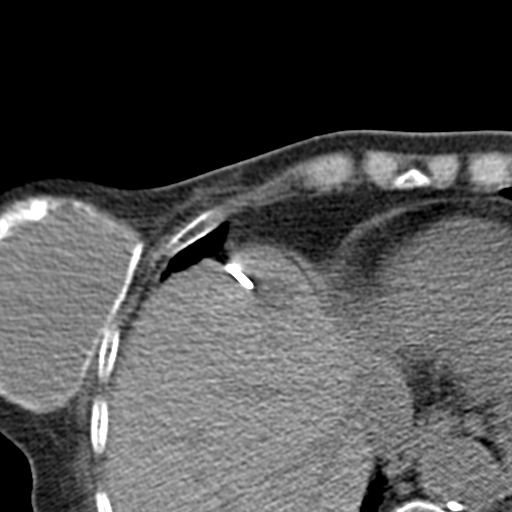
[im 29/36  lung]
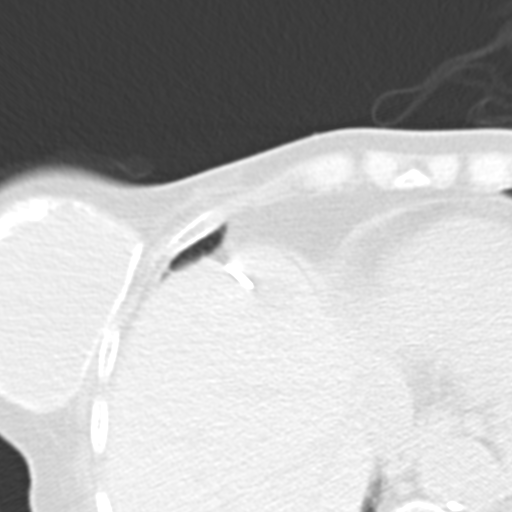
[im 29/36  bone]
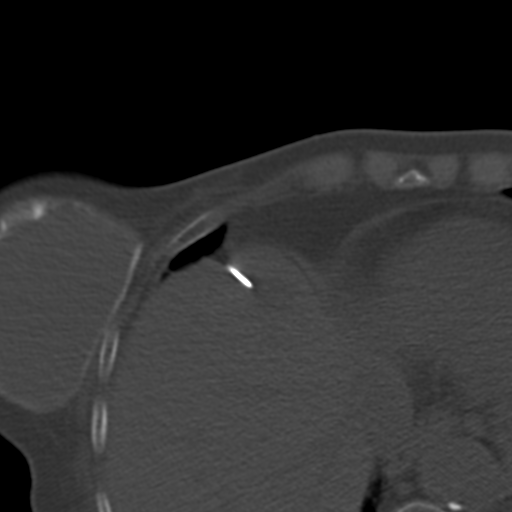
[im 31/36  soft-tissue]
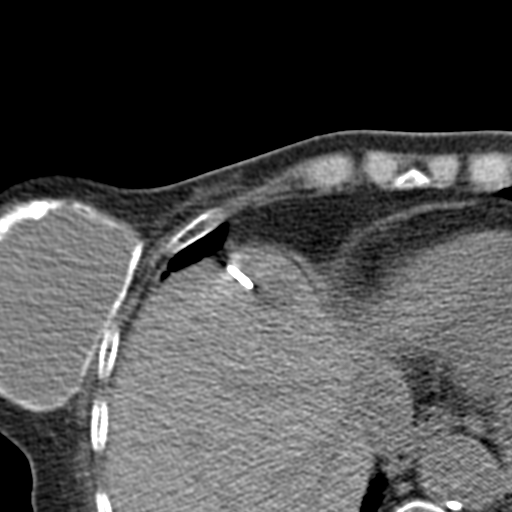
[im 31/36  lung]
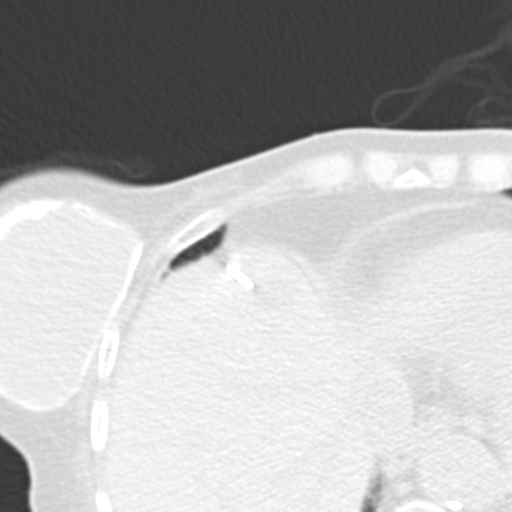
[im 32/36  soft-tissue]
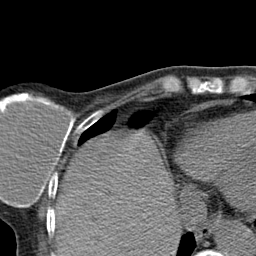
[im 32/36  lung]
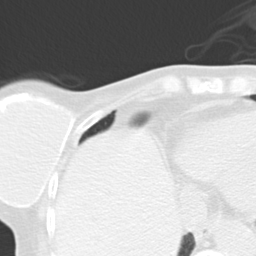
[im 34/36  soft-tissue]
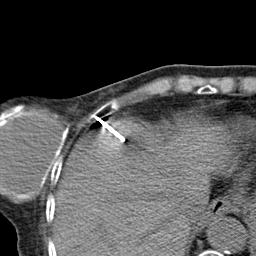
[im 34/36  lung]
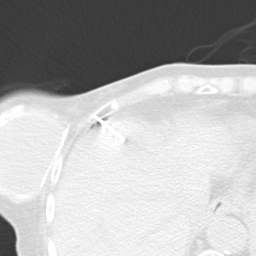

[20 of 32 positions shown; findings below may reference images not displayed]

CT abdomen pelvis -09/27/2013;
abdominal MRI -08/14/2013; PET-CT - 08/06/2013

PROCEDURE:
Informed consent was obtained from the patient following an
explanation of the procedure, risks, benefits and alternatives. A
time out was performed prior to the initiation of the procedure.

The patient was positioned supine on the CT table and a limited CT
was performed for procedural planning demonstrating grossly
unchanged appearance of an approximately 1.6 x 1.3 cm hypo
attenuating lesion within the anterior aspect of the dome of the
right lobe of the liver (image 2, series 2). Ultrasound scanning was
again performed however again failed to delineate a definitive
lesion at this location. As such, the procedure was attempted with
CT fluoroscopic guidance.

The procedure was planned. The operative site was prepped and draped
in the usual sterile fashion. Appropriate trajectory was confirmed
with a 22 gauge spinal needle after the adjacent tissues were
anesthetized with 1% Lidocaine with epinephrine. Under intermittent
CT guidance, a 17 gauge coaxial needle was advanced into the
peripheral aspect of the mass. Positioning was confirmed and 3 core
needle biopsies were obtained with an 18 gauge core needle biopsy
device. The co-axial needle was removed and hemostasis was achieved
with manual compression.

A limited postprocedural CT was negative for hemorrhage or
additional complication. A dressing was placed. The patient
tolerated the procedure well without immediate postprocedural
complication.

COMPLICATIONS:
None immediate
IMPRESSION: Technically successful CT guided core needle biopsy ofdominant hypo
attenuating lesion within the anterior aspect of the dome of the
right lobe of the liver.
